# Patient Record
Sex: Female | Born: 1940
Health system: Southern US, Community
[De-identification: ages and names within clinical notes are randomized; demographics above are authoritative.]

## PROBLEM LIST (undated history)

## (undated) DIAGNOSIS — R413 Other amnesia: Secondary | ICD-10-CM

## (undated) DIAGNOSIS — Z818 Family history of other mental and behavioral disorders: Secondary | ICD-10-CM

## (undated) DIAGNOSIS — N390 Urinary tract infection, site not specified: Secondary | ICD-10-CM

## (undated) DIAGNOSIS — A879 Viral meningitis, unspecified: Secondary | ICD-10-CM

## (undated) DIAGNOSIS — D239 Other benign neoplasm of skin, unspecified: Secondary | ICD-10-CM

## (undated) DIAGNOSIS — M19041 Primary osteoarthritis, right hand: Secondary | ICD-10-CM

## (undated) DIAGNOSIS — M5136 Other intervertebral disc degeneration, lumbar region: Principal | ICD-10-CM

## (undated) DIAGNOSIS — G4733 Obstructive sleep apnea (adult) (pediatric): Secondary | ICD-10-CM

## (undated) DIAGNOSIS — Z833 Family history of diabetes mellitus: Secondary | ICD-10-CM

## (undated) DIAGNOSIS — M159 Polyosteoarthritis, unspecified: Secondary | ICD-10-CM

## (undated) DIAGNOSIS — K449 Diaphragmatic hernia without obstruction or gangrene: Secondary | ICD-10-CM

## (undated) DIAGNOSIS — F429 Obsessive-compulsive disorder, unspecified: Secondary | ICD-10-CM

## (undated) DIAGNOSIS — K5730 Diverticulosis of large intestine without perforation or abscess without bleeding: Secondary | ICD-10-CM

## (undated) DIAGNOSIS — M810 Age-related osteoporosis without current pathological fracture: Secondary | ICD-10-CM

## (undated) DIAGNOSIS — D649 Anemia, unspecified: Secondary | ICD-10-CM

## (undated) DIAGNOSIS — R4182 Altered mental status, unspecified: Secondary | ICD-10-CM

## (undated) DIAGNOSIS — S060X0A Concussion without loss of consciousness, initial encounter: Secondary | ICD-10-CM

## (undated) DIAGNOSIS — R7989 Other specified abnormal findings of blood chemistry: Secondary | ICD-10-CM

## (undated) DIAGNOSIS — D2320 Other benign neoplasm of skin of unspecified ear and external auricular canal: Secondary | ICD-10-CM

## (undated) DIAGNOSIS — R197 Diarrhea, unspecified: Secondary | ICD-10-CM

## (undated) DIAGNOSIS — G4700 Insomnia, unspecified: Secondary | ICD-10-CM

## (undated) DIAGNOSIS — F419 Anxiety disorder, unspecified: Secondary | ICD-10-CM

## (undated) DIAGNOSIS — J301 Allergic rhinitis due to pollen: Secondary | ICD-10-CM

## (undated) DIAGNOSIS — A6000 Herpesviral infection of urogenital system, unspecified: Secondary | ICD-10-CM

## (undated) DIAGNOSIS — K9089 Other intestinal malabsorption: Secondary | ICD-10-CM

## (undated) DIAGNOSIS — W19XXXA Unspecified fall, initial encounter: Secondary | ICD-10-CM

## (undated) DIAGNOSIS — M109 Gout, unspecified: Secondary | ICD-10-CM

## (undated) DIAGNOSIS — G4721 Circadian rhythm sleep disorder, delayed sleep phase type: Secondary | ICD-10-CM

## (undated) DIAGNOSIS — G2581 Restless legs syndrome: Secondary | ICD-10-CM

## (undated) DIAGNOSIS — Z0000 Encounter for general adult medical examination without abnormal findings: Secondary | ICD-10-CM

## (undated) DIAGNOSIS — B029 Zoster without complications: Secondary | ICD-10-CM

## (undated) DIAGNOSIS — E039 Hypothyroidism, unspecified: Secondary | ICD-10-CM

## (undated) DIAGNOSIS — M19042 Primary osteoarthritis, left hand: Secondary | ICD-10-CM

## (undated) DIAGNOSIS — M170 Bilateral primary osteoarthritis of knee: Secondary | ICD-10-CM

## (undated) DIAGNOSIS — R109 Unspecified abdominal pain: Secondary | ICD-10-CM

## (undated) DIAGNOSIS — E785 Hyperlipidemia, unspecified: Secondary | ICD-10-CM

## (undated) DIAGNOSIS — H6120 Impacted cerumen, unspecified ear: Secondary | ICD-10-CM

## (undated) DIAGNOSIS — R000 Tachycardia, unspecified: Secondary | ICD-10-CM

## (undated) DIAGNOSIS — R0602 Shortness of breath: Secondary | ICD-10-CM

## (undated) DIAGNOSIS — Z860100 Personal history of colon polyps, unspecified: Secondary | ICD-10-CM

## (undated) DIAGNOSIS — R600 Localized edema: Secondary | ICD-10-CM

## (undated) DIAGNOSIS — D229 Melanocytic nevi, unspecified: Secondary | ICD-10-CM

## (undated) DIAGNOSIS — M549 Dorsalgia, unspecified: Secondary | ICD-10-CM

## (undated) DIAGNOSIS — I10 Essential (primary) hypertension: Secondary | ICD-10-CM

## (undated) DIAGNOSIS — K589 Irritable bowel syndrome without diarrhea: Secondary | ICD-10-CM

## (undated) DIAGNOSIS — K2980 Duodenitis without bleeding: Secondary | ICD-10-CM

## (undated) DIAGNOSIS — R410 Disorientation, unspecified: Secondary | ICD-10-CM

## (undated) DIAGNOSIS — E119 Type 2 diabetes mellitus without complications: Secondary | ICD-10-CM

## (undated) DIAGNOSIS — K219 Gastro-esophageal reflux disease without esophagitis: Secondary | ICD-10-CM

## (undated) HISTORY — DX: Hyperlipidemia, unspecified: E78.5

## (undated) HISTORY — DX: Anemia, unspecified: D64.9

## (undated) HISTORY — DX: Hypothyroidism, unspecified: E03.9

## (undated) HISTORY — DX: Dorsalgia, unspecified: M54.9

## (undated) HISTORY — DX: Essential (primary) hypertension: I10

## (undated) HISTORY — DX: Type 2 diabetes mellitus without complications: E11.9

## (undated) HISTORY — DX: Localized edema: R60.0

## (undated) HISTORY — DX: Other specified abnormal findings of blood chemistry: R79.89

## (undated) HISTORY — DX: Other intervertebral disc degeneration, lumbar region: M51.36

## (undated) HISTORY — DX: Herpesviral infection of urogenital system, unspecified: A60.00

## (undated) HISTORY — DX: Polyosteoarthritis, unspecified: M15.9

## (undated) HISTORY — DX: Primary osteoarthritis, right hand: M19.041

## (undated) HISTORY — DX: Zoster without complications: B02.9

## (undated) HISTORY — DX: Primary osteoarthritis, left hand: M19.042

## (undated) HISTORY — DX: Gastro-esophageal reflux disease without esophagitis: K21.9

## (undated) HISTORY — DX: Diaphragmatic hernia without obstruction or gangrene: K44.9

## (undated) HISTORY — DX: Age-related osteoporosis without current pathological fracture: M81.0

## (undated) HISTORY — DX: Restless legs syndrome: G25.81

## (undated) HISTORY — DX: Viral meningitis, unspecified: A87.9

## (undated) HISTORY — DX: Unspecified abdominal pain: R10.9

## (undated) HISTORY — DX: Urinary tract infection, site not specified: N39.0

## (undated) HISTORY — DX: Tachycardia, unspecified: R00.0

## (undated) HISTORY — DX: Diarrhea, unspecified: R19.7

## (undated) HISTORY — DX: Impacted cerumen, unspecified ear: H61.20

## (undated) HISTORY — DX: Irritable bowel syndrome without diarrhea: K58.9

## (undated) HISTORY — DX: Shortness of breath: R06.02

## (undated) HISTORY — DX: Family history of other mental and behavioral disorders: Z81.8

## (undated) HISTORY — DX: Family history of diabetes mellitus: Z83.3

## (undated) HISTORY — DX: Personal history of colon polyps, unspecified: Z86.0100

## (undated) HISTORY — DX: Concussion without loss of consciousness, initial encounter: S06.0X0A

## (undated) HISTORY — DX: Melanocytic nevi, unspecified: D22.9

## (undated) HISTORY — DX: Other intestinal malabsorption: K90.89

## (undated) HISTORY — DX: Other benign neoplasm of skin of unspecified ear and external auricular canal: D23.20

## (undated) HISTORY — DX: Obsessive-compulsive disorder, unspecified: F42.9

## (undated) HISTORY — DX: Other benign neoplasm of skin, unspecified: D23.9

## (undated) HISTORY — DX: Personal history of colonic polyps: Z86.010

## (undated) HISTORY — DX: Bilateral primary osteoarthritis of knee: M17.0

## (undated) HISTORY — DX: Allergic rhinitis due to pollen: J30.1

## (undated) HISTORY — DX: Altered mental status, unspecified: R41.82

## (undated) HISTORY — DX: Gout, unspecified: M10.9

## (undated) HISTORY — DX: Insomnia, unspecified: G47.00

## (undated) HISTORY — DX: Duodenitis without bleeding: K29.80

## (undated) HISTORY — DX: Anxiety disorder, unspecified: F41.9

## (undated) HISTORY — DX: Other amnesia: R41.3

## (undated) HISTORY — DX: Circadian rhythm sleep disorder, delayed sleep phase type: G47.21

## (undated) HISTORY — DX: Diverticulosis of large intestine without perforation or abscess without bleeding: K57.30

## (undated) HISTORY — DX: Unspecified fall, initial encounter: W19.XXXA

## (undated) HISTORY — DX: Disorientation, unspecified: R41.0

## (undated) HISTORY — DX: Obstructive sleep apnea (adult) (pediatric): G47.33

## (undated) HISTORY — PX: COLONOSCOPY: SHX174

## (undated) HISTORY — DX: Encounter for general adult medical examination without abnormal findings: Z00.00

---

## 1997-07-23 ENCOUNTER — Ambulatory Visit (HOSPITAL_COMMUNITY): Admission: RE | Admit: 1997-07-23 | Discharge: 1997-07-23 | Payer: Self-pay | Admitting: Obstetrics and Gynecology

## 1998-02-09 HISTORY — PX: NASAL SINUS SURGERY: SHX719

## 1998-03-29 ENCOUNTER — Other Ambulatory Visit: Admission: RE | Admit: 1998-03-29 | Discharge: 1998-03-29 | Payer: Self-pay | Admitting: Obstetrics and Gynecology

## 1998-08-07 ENCOUNTER — Encounter: Payer: Self-pay | Admitting: Obstetrics and Gynecology

## 1998-08-09 ENCOUNTER — Encounter (INDEPENDENT_AMBULATORY_CARE_PROVIDER_SITE_OTHER): Payer: Self-pay | Admitting: Specialist

## 1998-08-09 ENCOUNTER — Ambulatory Visit (HOSPITAL_COMMUNITY): Admission: RE | Admit: 1998-08-09 | Discharge: 1998-08-09 | Payer: Self-pay | Admitting: Obstetrics and Gynecology

## 1999-04-07 ENCOUNTER — Other Ambulatory Visit: Admission: RE | Admit: 1999-04-07 | Discharge: 1999-04-07 | Payer: Self-pay | Admitting: Obstetrics and Gynecology

## 2000-05-10 ENCOUNTER — Other Ambulatory Visit: Admission: RE | Admit: 2000-05-10 | Discharge: 2000-05-10 | Payer: Self-pay | Admitting: Obstetrics and Gynecology

## 2001-06-20 ENCOUNTER — Other Ambulatory Visit: Admission: RE | Admit: 2001-06-20 | Discharge: 2001-06-20 | Payer: Self-pay | Admitting: Obstetrics and Gynecology

## 2002-07-24 ENCOUNTER — Other Ambulatory Visit: Admission: RE | Admit: 2002-07-24 | Discharge: 2002-07-24 | Payer: Self-pay | Admitting: Obstetrics and Gynecology

## 2002-08-26 ENCOUNTER — Emergency Department (HOSPITAL_COMMUNITY): Admission: EM | Admit: 2002-08-26 | Discharge: 2002-08-26 | Payer: Self-pay | Admitting: Emergency Medicine

## 2003-02-10 HISTORY — PX: CARPAL TUNNEL RELEASE: SHX101

## 2003-05-27 ENCOUNTER — Emergency Department (HOSPITAL_COMMUNITY): Admission: EM | Admit: 2003-05-27 | Discharge: 2003-05-27 | Payer: Self-pay | Admitting: Emergency Medicine

## 2003-09-06 ENCOUNTER — Other Ambulatory Visit: Admission: RE | Admit: 2003-09-06 | Discharge: 2003-09-06 | Payer: Self-pay | Admitting: Obstetrics and Gynecology

## 2004-06-10 ENCOUNTER — Ambulatory Visit: Payer: Self-pay | Admitting: Family Medicine

## 2004-06-16 ENCOUNTER — Emergency Department (HOSPITAL_COMMUNITY): Admission: EM | Admit: 2004-06-16 | Discharge: 2004-06-16 | Payer: Self-pay | Admitting: Emergency Medicine

## 2004-06-25 ENCOUNTER — Ambulatory Visit: Payer: Self-pay | Admitting: Family Medicine

## 2004-06-26 ENCOUNTER — Ambulatory Visit: Payer: Self-pay | Admitting: Gastroenterology

## 2004-06-30 ENCOUNTER — Ambulatory Visit: Payer: Self-pay | Admitting: Gastroenterology

## 2004-06-30 ENCOUNTER — Encounter (INDEPENDENT_AMBULATORY_CARE_PROVIDER_SITE_OTHER): Payer: Self-pay | Admitting: Specialist

## 2004-07-01 ENCOUNTER — Encounter: Payer: Self-pay | Admitting: Gastroenterology

## 2004-07-15 ENCOUNTER — Ambulatory Visit: Payer: Self-pay | Admitting: Gastroenterology

## 2004-07-29 ENCOUNTER — Ambulatory Visit: Payer: Self-pay | Admitting: Gastroenterology

## 2004-08-21 ENCOUNTER — Ambulatory Visit: Payer: Self-pay | Admitting: Family Medicine

## 2004-10-14 ENCOUNTER — Ambulatory Visit: Payer: Self-pay | Admitting: Gastroenterology

## 2004-11-27 ENCOUNTER — Ambulatory Visit: Payer: Self-pay | Admitting: Gastroenterology

## 2004-12-10 ENCOUNTER — Encounter (INDEPENDENT_AMBULATORY_CARE_PROVIDER_SITE_OTHER): Payer: Self-pay | Admitting: Specialist

## 2004-12-10 ENCOUNTER — Ambulatory Visit: Payer: Self-pay | Admitting: Gastroenterology

## 2005-01-09 ENCOUNTER — Ambulatory Visit: Payer: Self-pay | Admitting: Gastroenterology

## 2005-02-27 ENCOUNTER — Ambulatory Visit: Payer: Self-pay | Admitting: Gastroenterology

## 2005-03-31 ENCOUNTER — Ambulatory Visit: Payer: Self-pay | Admitting: Gastroenterology

## 2005-04-16 ENCOUNTER — Ambulatory Visit: Payer: Self-pay | Admitting: Gastroenterology

## 2005-04-21 ENCOUNTER — Ambulatory Visit: Payer: Self-pay | Admitting: Family Medicine

## 2005-04-22 ENCOUNTER — Ambulatory Visit: Payer: Self-pay | Admitting: Gastroenterology

## 2005-04-29 ENCOUNTER — Ambulatory Visit: Payer: Self-pay | Admitting: Gastroenterology

## 2005-05-07 ENCOUNTER — Ambulatory Visit: Payer: Self-pay | Admitting: Gastroenterology

## 2005-06-02 ENCOUNTER — Ambulatory Visit: Payer: Self-pay | Admitting: Family Medicine

## 2005-07-14 ENCOUNTER — Ambulatory Visit: Payer: Self-pay | Admitting: Family Medicine

## 2005-07-30 ENCOUNTER — Ambulatory Visit: Payer: Self-pay | Admitting: Gastroenterology

## 2005-08-06 ENCOUNTER — Ambulatory Visit: Payer: Self-pay | Admitting: Gastroenterology

## 2005-08-27 ENCOUNTER — Ambulatory Visit: Payer: Self-pay | Admitting: Family Medicine

## 2005-09-15 ENCOUNTER — Ambulatory Visit: Payer: Self-pay | Admitting: Gastroenterology

## 2005-11-12 ENCOUNTER — Encounter: Admission: RE | Admit: 2005-11-12 | Discharge: 2005-11-12 | Payer: Self-pay | Admitting: Orthopedic Surgery

## 2005-12-21 ENCOUNTER — Ambulatory Visit: Payer: Self-pay | Admitting: Family Medicine

## 2005-12-23 ENCOUNTER — Inpatient Hospital Stay (HOSPITAL_COMMUNITY): Admission: RE | Admit: 2005-12-23 | Discharge: 2005-12-25 | Payer: Self-pay | Admitting: Orthopedic Surgery

## 2005-12-24 HISTORY — PX: LAMINECTOMY: SHX219

## 2006-03-08 ENCOUNTER — Ambulatory Visit: Payer: Self-pay | Admitting: Family Medicine

## 2006-03-11 ENCOUNTER — Ambulatory Visit: Payer: Self-pay | Admitting: Gastroenterology

## 2006-03-11 LAB — CONVERTED CEMR LAB
BUN: 17 mg/dL (ref 6–23)
Basophils Absolute: 0.1 10*3/uL (ref 0.0–0.1)
Basophils Relative: 0.8 % (ref 0.0–1.0)
CO2: 26 meq/L (ref 19–32)
GFR calc Af Amer: 80 mL/min
Hemoglobin: 14.9 g/dL (ref 12.0–15.0)
Lymphocytes Relative: 20.5 % (ref 12.0–46.0)
MCHC: 34.7 g/dL (ref 30.0–36.0)
Monocytes Absolute: 1.2 10*3/uL — ABNORMAL HIGH (ref 0.2–0.7)
Monocytes Relative: 9 % (ref 3.0–11.0)
Neutro Abs: 9.3 10*3/uL — ABNORMAL HIGH (ref 1.4–7.7)
Neutrophils Relative %: 68.8 % (ref 43.0–77.0)
Potassium: 3.8 meq/L (ref 3.5–5.1)
Sed Rate: 10 mm/hr (ref 0–25)
Sodium: 138 meq/L (ref 135–145)

## 2006-03-15 ENCOUNTER — Encounter: Payer: Self-pay | Admitting: Gastroenterology

## 2006-03-31 ENCOUNTER — Ambulatory Visit: Payer: Self-pay | Admitting: Family Medicine

## 2006-03-31 LAB — CONVERTED CEMR LAB
Albumin: 3.6 g/dL (ref 3.5–5.2)
Basophils Absolute: 0.1 10*3/uL (ref 0.0–0.1)
Bilirubin, Direct: 0.1 mg/dL (ref 0.0–0.3)
Eosinophils Absolute: 0.1 10*3/uL (ref 0.0–0.6)
Eosinophils Relative: 0.9 % (ref 0.0–5.0)
GFR calc Af Amer: 80 mL/min
GFR calc non Af Amer: 66 mL/min
Glucose, Bld: 158 mg/dL — ABNORMAL HIGH (ref 70–99)
HCT: 43.4 % (ref 36.0–46.0)
Hemoglobin: 15.1 g/dL — ABNORMAL HIGH (ref 12.0–15.0)
Hgb A1c MFr Bld: 7.3 % — ABNORMAL HIGH (ref 4.6–6.0)
Lymphocytes Relative: 31.3 % (ref 12.0–46.0)
MCHC: 34.8 g/dL (ref 30.0–36.0)
MCV: 96.9 fL (ref 78.0–100.0)
Monocytes Absolute: 0.9 10*3/uL — ABNORMAL HIGH (ref 0.2–0.7)
Neutro Abs: 6.1 10*3/uL (ref 1.4–7.7)
Neutrophils Relative %: 58.1 % (ref 43.0–77.0)
Potassium: 3.8 meq/L (ref 3.5–5.1)
RBC: 4.48 M/uL (ref 3.87–5.11)
Sodium: 139 meq/L (ref 135–145)
T3, Free: 2.7 pg/mL (ref 2.3–4.2)
TSH: 6.92 microintl units/mL — ABNORMAL HIGH (ref 0.35–5.50)
Vitamin B-12: 391 pg/mL (ref 211–911)

## 2006-04-05 ENCOUNTER — Ambulatory Visit: Payer: Self-pay | Admitting: Family Medicine

## 2006-04-11 ENCOUNTER — Encounter: Admission: RE | Admit: 2006-04-11 | Discharge: 2006-04-11 | Payer: Self-pay | Admitting: Family Medicine

## 2006-04-15 ENCOUNTER — Ambulatory Visit: Payer: Self-pay

## 2006-04-16 ENCOUNTER — Ambulatory Visit: Payer: Self-pay | Admitting: Gastroenterology

## 2006-04-26 ENCOUNTER — Ambulatory Visit: Payer: Self-pay | Admitting: Gastroenterology

## 2006-04-26 ENCOUNTER — Encounter (INDEPENDENT_AMBULATORY_CARE_PROVIDER_SITE_OTHER): Payer: Self-pay | Admitting: Specialist

## 2006-06-10 ENCOUNTER — Ambulatory Visit: Payer: Self-pay | Admitting: Gastroenterology

## 2006-07-07 ENCOUNTER — Encounter: Payer: Self-pay | Admitting: Family Medicine

## 2006-07-14 ENCOUNTER — Ambulatory Visit: Payer: Self-pay | Admitting: Family Medicine

## 2006-07-14 DIAGNOSIS — Z8659 Personal history of other mental and behavioral disorders: Secondary | ICD-10-CM | POA: Insufficient documentation

## 2006-07-14 DIAGNOSIS — I10 Essential (primary) hypertension: Secondary | ICD-10-CM | POA: Insufficient documentation

## 2006-07-14 DIAGNOSIS — M109 Gout, unspecified: Secondary | ICD-10-CM | POA: Insufficient documentation

## 2006-07-14 DIAGNOSIS — R609 Edema, unspecified: Secondary | ICD-10-CM | POA: Insufficient documentation

## 2006-07-14 DIAGNOSIS — E039 Hypothyroidism, unspecified: Secondary | ICD-10-CM | POA: Insufficient documentation

## 2006-07-14 DIAGNOSIS — R0602 Shortness of breath: Secondary | ICD-10-CM

## 2006-07-21 ENCOUNTER — Telehealth (INDEPENDENT_AMBULATORY_CARE_PROVIDER_SITE_OTHER): Payer: Self-pay | Admitting: *Deleted

## 2006-07-28 ENCOUNTER — Encounter: Payer: Self-pay | Admitting: Family Medicine

## 2006-07-30 ENCOUNTER — Ambulatory Visit: Payer: Self-pay | Admitting: Family Medicine

## 2006-07-30 DIAGNOSIS — R7989 Other specified abnormal findings of blood chemistry: Secondary | ICD-10-CM | POA: Insufficient documentation

## 2006-08-02 ENCOUNTER — Ambulatory Visit: Payer: Self-pay | Admitting: Family Medicine

## 2006-08-04 ENCOUNTER — Ambulatory Visit: Payer: Self-pay

## 2006-08-04 ENCOUNTER — Encounter: Payer: Self-pay | Admitting: Family Medicine

## 2006-08-05 ENCOUNTER — Telehealth (INDEPENDENT_AMBULATORY_CARE_PROVIDER_SITE_OTHER): Payer: Self-pay | Admitting: *Deleted

## 2006-08-05 ENCOUNTER — Encounter: Payer: Self-pay | Admitting: Family Medicine

## 2006-08-05 LAB — CONVERTED CEMR LAB
Chloride: 109 meq/L (ref 96–112)
GFR calc non Af Amer: 66 mL/min
Glucose, Bld: 119 mg/dL — ABNORMAL HIGH (ref 70–99)
Hgb A1c MFr Bld: 6.5 % — ABNORMAL HIGH (ref 4.6–6.0)
Potassium: 3.9 meq/L (ref 3.5–5.1)
Sodium: 141 meq/L (ref 135–145)

## 2006-08-06 ENCOUNTER — Encounter (INDEPENDENT_AMBULATORY_CARE_PROVIDER_SITE_OTHER): Payer: Self-pay | Admitting: *Deleted

## 2006-08-10 ENCOUNTER — Telehealth (INDEPENDENT_AMBULATORY_CARE_PROVIDER_SITE_OTHER): Payer: Self-pay | Admitting: *Deleted

## 2006-09-06 ENCOUNTER — Emergency Department (HOSPITAL_COMMUNITY): Admission: EM | Admit: 2006-09-06 | Discharge: 2006-09-06 | Payer: Self-pay | Admitting: Emergency Medicine

## 2006-09-07 ENCOUNTER — Telehealth (INDEPENDENT_AMBULATORY_CARE_PROVIDER_SITE_OTHER): Payer: Self-pay | Admitting: *Deleted

## 2006-09-07 ENCOUNTER — Ambulatory Visit: Payer: Self-pay | Admitting: Family Medicine

## 2006-09-08 LAB — CONVERTED CEMR LAB
Alkaline Phosphatase: 80 units/L (ref 39–117)
Bilirubin, Direct: 0.1 mg/dL (ref 0.0–0.3)
CO2: 29 meq/L (ref 19–32)
Creatinine, Ser: 1 mg/dL (ref 0.4–1.2)
GFR calc Af Amer: 71 mL/min
Glucose, Bld: 180 mg/dL — ABNORMAL HIGH (ref 70–99)
Potassium: 4.3 meq/L (ref 3.5–5.1)
Sodium: 137 meq/L (ref 135–145)
Total Bilirubin: 0.8 mg/dL (ref 0.3–1.2)
Total Protein: 6.7 g/dL (ref 6.0–8.3)

## 2006-09-20 ENCOUNTER — Telehealth (INDEPENDENT_AMBULATORY_CARE_PROVIDER_SITE_OTHER): Payer: Self-pay | Admitting: *Deleted

## 2006-10-18 ENCOUNTER — Telehealth: Payer: Self-pay | Admitting: Family Medicine

## 2006-10-19 ENCOUNTER — Ambulatory Visit: Payer: Self-pay | Admitting: Family Medicine

## 2006-10-27 LAB — CONVERTED CEMR LAB
ALT: 24 units/L (ref 0–35)
AST: 26 units/L (ref 0–37)
Albumin: 3.5 g/dL (ref 3.5–5.2)
Basophils Absolute: 0 10*3/uL (ref 0.0–0.1)
Calcium: 9 mg/dL (ref 8.4–10.5)
Chloride: 106 meq/L (ref 96–112)
Creatinine, Ser: 0.9 mg/dL (ref 0.4–1.2)
Eosinophils Absolute: 0.1 10*3/uL (ref 0.0–0.6)
Eosinophils Relative: 1.7 % (ref 0.0–5.0)
GFR calc non Af Amer: 66 mL/min
HCT: 41 % (ref 36.0–46.0)
MCV: 96.7 fL (ref 78.0–100.0)
Neutrophils Relative %: 57.2 % (ref 43.0–77.0)
Platelets: 332 10*3/uL (ref 150–400)
RBC: 4.24 M/uL (ref 3.87–5.11)
RDW: 12.8 % (ref 11.5–14.6)
Total Bilirubin: 0.5 mg/dL (ref 0.3–1.2)
WBC: 7.2 10*3/uL (ref 4.5–10.5)

## 2006-11-04 ENCOUNTER — Ambulatory Visit: Payer: Self-pay | Admitting: Family Medicine

## 2006-11-10 ENCOUNTER — Encounter: Admission: RE | Admit: 2006-11-10 | Discharge: 2006-11-10 | Payer: Self-pay | Admitting: Family Medicine

## 2006-11-23 ENCOUNTER — Ambulatory Visit: Payer: Self-pay | Admitting: Family Medicine

## 2006-11-23 DIAGNOSIS — H6120 Impacted cerumen, unspecified ear: Secondary | ICD-10-CM

## 2006-12-28 ENCOUNTER — Ambulatory Visit: Payer: Self-pay | Admitting: Family Medicine

## 2006-12-29 LAB — CONVERTED CEMR LAB
CO2: 25 meq/L (ref 19–32)
Chloride: 104 meq/L (ref 96–112)
Creatinine, Ser: 1 mg/dL (ref 0.4–1.2)
Glucose, Bld: 143 mg/dL — ABNORMAL HIGH (ref 70–99)
Potassium: 3.7 meq/L (ref 3.5–5.1)
Sodium: 141 meq/L (ref 135–145)

## 2007-01-25 ENCOUNTER — Ambulatory Visit: Payer: Self-pay | Admitting: Internal Medicine

## 2007-01-25 DIAGNOSIS — B029 Zoster without complications: Secondary | ICD-10-CM | POA: Insufficient documentation

## 2007-02-25 ENCOUNTER — Telehealth (INDEPENDENT_AMBULATORY_CARE_PROVIDER_SITE_OTHER): Payer: Self-pay | Admitting: *Deleted

## 2007-02-25 ENCOUNTER — Ambulatory Visit: Payer: Self-pay | Admitting: Family Medicine

## 2007-02-25 LAB — CONVERTED CEMR LAB
Bilirubin Urine: NEGATIVE
Blood in Urine, dipstick: NEGATIVE
Ketones, urine, test strip: NEGATIVE
Protein, U semiquant: NEGATIVE
Urobilinogen, UA: NEGATIVE

## 2007-03-11 ENCOUNTER — Telehealth (INDEPENDENT_AMBULATORY_CARE_PROVIDER_SITE_OTHER): Payer: Self-pay | Admitting: *Deleted

## 2007-03-14 DIAGNOSIS — M810 Age-related osteoporosis without current pathological fracture: Secondary | ICD-10-CM | POA: Insufficient documentation

## 2007-03-18 ENCOUNTER — Encounter: Payer: Self-pay | Admitting: Family Medicine

## 2007-03-21 ENCOUNTER — Ambulatory Visit: Payer: Self-pay | Admitting: Family Medicine

## 2007-03-21 DIAGNOSIS — D2320 Other benign neoplasm of skin of unspecified ear and external auricular canal: Secondary | ICD-10-CM

## 2007-03-21 DIAGNOSIS — E785 Hyperlipidemia, unspecified: Secondary | ICD-10-CM

## 2007-03-21 DIAGNOSIS — D239 Other benign neoplasm of skin, unspecified: Secondary | ICD-10-CM | POA: Insufficient documentation

## 2007-03-21 LAB — CONVERTED CEMR LAB
Bilirubin Urine: NEGATIVE
Blood in Urine, dipstick: NEGATIVE
Glucose, Urine, Semiquant: NEGATIVE
Ketones, urine, test strip: NEGATIVE
Protein, U semiquant: NEGATIVE
Specific Gravity, Urine: >=1.03

## 2007-03-23 ENCOUNTER — Encounter (INDEPENDENT_AMBULATORY_CARE_PROVIDER_SITE_OTHER): Payer: Self-pay | Admitting: *Deleted

## 2007-03-26 ENCOUNTER — Encounter: Payer: Self-pay | Admitting: Family Medicine

## 2007-03-28 ENCOUNTER — Telehealth (INDEPENDENT_AMBULATORY_CARE_PROVIDER_SITE_OTHER): Payer: Self-pay | Admitting: *Deleted

## 2007-04-13 ENCOUNTER — Encounter: Payer: Self-pay | Admitting: Family Medicine

## 2007-04-15 ENCOUNTER — Encounter: Payer: Self-pay | Admitting: Family Medicine

## 2007-06-14 ENCOUNTER — Ambulatory Visit: Payer: Self-pay | Admitting: Internal Medicine

## 2007-07-12 ENCOUNTER — Telehealth (INDEPENDENT_AMBULATORY_CARE_PROVIDER_SITE_OTHER): Payer: Self-pay | Admitting: *Deleted

## 2007-07-14 ENCOUNTER — Ambulatory Visit: Payer: Self-pay | Admitting: Internal Medicine

## 2007-07-15 ENCOUNTER — Telehealth: Payer: Self-pay | Admitting: Internal Medicine

## 2007-07-21 ENCOUNTER — Telehealth: Payer: Self-pay | Admitting: Internal Medicine

## 2007-08-01 ENCOUNTER — Telehealth (INDEPENDENT_AMBULATORY_CARE_PROVIDER_SITE_OTHER): Payer: Self-pay | Admitting: *Deleted

## 2007-08-02 ENCOUNTER — Ambulatory Visit: Payer: Self-pay | Admitting: Family Medicine

## 2007-08-04 ENCOUNTER — Telehealth (INDEPENDENT_AMBULATORY_CARE_PROVIDER_SITE_OTHER): Payer: Self-pay | Admitting: *Deleted

## 2007-08-04 LAB — CONVERTED CEMR LAB
BUN: 11 mg/dL (ref 6–23)
CO2: 27 meq/L (ref 19–32)
Chloride: 103 meq/L (ref 96–112)
GFR calc non Af Amer: 66 mL/min
Potassium: 4.3 meq/L (ref 3.5–5.1)

## 2007-08-10 ENCOUNTER — Emergency Department (HOSPITAL_COMMUNITY): Admission: EM | Admit: 2007-08-10 | Discharge: 2007-08-10 | Payer: Self-pay | Admitting: Emergency Medicine

## 2007-08-10 ENCOUNTER — Telehealth (INDEPENDENT_AMBULATORY_CARE_PROVIDER_SITE_OTHER): Payer: Self-pay | Admitting: *Deleted

## 2007-08-11 ENCOUNTER — Ambulatory Visit: Payer: Self-pay | Admitting: Family Medicine

## 2007-08-14 ENCOUNTER — Inpatient Hospital Stay (HOSPITAL_COMMUNITY): Admission: EM | Admit: 2007-08-14 | Discharge: 2007-08-15 | Payer: Self-pay | Admitting: Emergency Medicine

## 2007-08-14 ENCOUNTER — Ambulatory Visit: Payer: Self-pay | Admitting: Internal Medicine

## 2007-08-14 LAB — CONVERTED CEMR LAB
Alkaline Phosphatase: 74 units/L (ref 39–117)
Bilirubin, Direct: 0.1 mg/dL (ref 0.0–0.3)
Total Protein: 6.3 g/dL (ref 6.0–8.3)

## 2007-08-15 ENCOUNTER — Encounter (INDEPENDENT_AMBULATORY_CARE_PROVIDER_SITE_OTHER): Payer: Self-pay | Admitting: *Deleted

## 2007-08-17 ENCOUNTER — Telehealth (INDEPENDENT_AMBULATORY_CARE_PROVIDER_SITE_OTHER): Payer: Self-pay | Admitting: *Deleted

## 2007-08-18 ENCOUNTER — Ambulatory Visit: Payer: Self-pay | Admitting: Family Medicine

## 2007-08-19 ENCOUNTER — Telehealth (INDEPENDENT_AMBULATORY_CARE_PROVIDER_SITE_OTHER): Payer: Self-pay | Admitting: *Deleted

## 2007-08-22 ENCOUNTER — Telehealth (INDEPENDENT_AMBULATORY_CARE_PROVIDER_SITE_OTHER): Payer: Self-pay | Admitting: *Deleted

## 2007-08-22 LAB — CONVERTED CEMR LAB
BUN: 11 mg/dL (ref 6–23)
Chloride: 99 meq/L (ref 96–112)
Glucose, Bld: 155 mg/dL — ABNORMAL HIGH (ref 70–99)
Potassium: 3.5 meq/L (ref 3.5–5.1)

## 2007-08-23 ENCOUNTER — Ambulatory Visit: Payer: Self-pay | Admitting: Family Medicine

## 2007-08-26 ENCOUNTER — Ambulatory Visit: Payer: Self-pay | Admitting: Family Medicine

## 2007-08-26 DIAGNOSIS — E119 Type 2 diabetes mellitus without complications: Secondary | ICD-10-CM | POA: Insufficient documentation

## 2007-08-26 LAB — CONVERTED CEMR LAB
BUN: 15 mg/dL (ref 6–23)
Chloride: 102 meq/L (ref 96–112)
Glucose, Bld: 157 mg/dL — ABNORMAL HIGH (ref 70–99)
Potassium: 3.5 meq/L (ref 3.5–5.1)

## 2007-08-27 ENCOUNTER — Encounter: Payer: Self-pay | Admitting: Family Medicine

## 2007-08-29 ENCOUNTER — Telehealth (INDEPENDENT_AMBULATORY_CARE_PROVIDER_SITE_OTHER): Payer: Self-pay | Admitting: *Deleted

## 2007-09-05 ENCOUNTER — Encounter: Admission: RE | Admit: 2007-09-05 | Discharge: 2007-11-01 | Payer: Self-pay | Admitting: Family Medicine

## 2007-09-05 ENCOUNTER — Encounter (INDEPENDENT_AMBULATORY_CARE_PROVIDER_SITE_OTHER): Payer: Self-pay | Admitting: *Deleted

## 2007-09-05 ENCOUNTER — Encounter: Payer: Self-pay | Admitting: Family Medicine

## 2007-09-05 LAB — CONVERTED CEMR LAB
Catecholamines Tot(E+NE) 24 Hr U: 0.028 mg/24hr
Dopamine 24 Hr Urine: 100 mcg/24hr (ref <500)
Norepinephrine 24 Hr Urine: 27 mcg/24hr (ref <80)
Protein, Ur: 20 mg/24hr — ABNORMAL LOW (ref 50–100)

## 2007-09-06 ENCOUNTER — Encounter: Payer: Self-pay | Admitting: Family Medicine

## 2007-09-08 ENCOUNTER — Telehealth (INDEPENDENT_AMBULATORY_CARE_PROVIDER_SITE_OTHER): Payer: Self-pay | Admitting: *Deleted

## 2007-09-08 ENCOUNTER — Ambulatory Visit: Payer: Self-pay | Admitting: Family Medicine

## 2007-09-08 LAB — CONVERTED CEMR LAB
Nitrite: NEGATIVE
pH: 6

## 2007-09-09 ENCOUNTER — Encounter: Payer: Self-pay | Admitting: Family Medicine

## 2007-09-11 ENCOUNTER — Encounter (INDEPENDENT_AMBULATORY_CARE_PROVIDER_SITE_OTHER): Payer: Self-pay | Admitting: *Deleted

## 2007-09-15 ENCOUNTER — Encounter: Payer: Self-pay | Admitting: Family Medicine

## 2007-09-23 ENCOUNTER — Ambulatory Visit: Payer: Self-pay | Admitting: Family Medicine

## 2007-09-26 ENCOUNTER — Encounter: Payer: Self-pay | Admitting: Family Medicine

## 2007-09-29 ENCOUNTER — Telehealth (INDEPENDENT_AMBULATORY_CARE_PROVIDER_SITE_OTHER): Payer: Self-pay | Admitting: *Deleted

## 2007-10-07 ENCOUNTER — Ambulatory Visit: Payer: Self-pay | Admitting: Family Medicine

## 2007-10-12 ENCOUNTER — Telehealth (INDEPENDENT_AMBULATORY_CARE_PROVIDER_SITE_OTHER): Payer: Self-pay | Admitting: *Deleted

## 2007-10-18 ENCOUNTER — Telehealth (INDEPENDENT_AMBULATORY_CARE_PROVIDER_SITE_OTHER): Payer: Self-pay | Admitting: *Deleted

## 2007-10-21 ENCOUNTER — Ambulatory Visit: Payer: Self-pay | Admitting: Family Medicine

## 2007-10-24 ENCOUNTER — Encounter (INDEPENDENT_AMBULATORY_CARE_PROVIDER_SITE_OTHER): Payer: Self-pay | Admitting: *Deleted

## 2007-11-13 ENCOUNTER — Telehealth (INDEPENDENT_AMBULATORY_CARE_PROVIDER_SITE_OTHER): Payer: Self-pay | Admitting: *Deleted

## 2007-11-13 ENCOUNTER — Ambulatory Visit: Payer: Self-pay | Admitting: Internal Medicine

## 2007-11-14 ENCOUNTER — Encounter (INDEPENDENT_AMBULATORY_CARE_PROVIDER_SITE_OTHER): Payer: Self-pay | Admitting: Neurology

## 2007-11-14 ENCOUNTER — Encounter: Payer: Self-pay | Admitting: Family Medicine

## 2007-11-14 ENCOUNTER — Ambulatory Visit: Payer: Self-pay | Admitting: Internal Medicine

## 2007-11-14 ENCOUNTER — Inpatient Hospital Stay (HOSPITAL_COMMUNITY): Admission: EM | Admit: 2007-11-14 | Discharge: 2007-11-23 | Payer: Self-pay | Admitting: Family Medicine

## 2007-11-15 ENCOUNTER — Encounter: Payer: Self-pay | Admitting: Family Medicine

## 2007-11-15 ENCOUNTER — Encounter (INDEPENDENT_AMBULATORY_CARE_PROVIDER_SITE_OTHER): Payer: Self-pay | Admitting: Neurology

## 2007-11-17 ENCOUNTER — Encounter: Payer: Self-pay | Admitting: Family Medicine

## 2007-11-19 ENCOUNTER — Encounter: Payer: Self-pay | Admitting: Family Medicine

## 2007-11-23 ENCOUNTER — Encounter: Payer: Self-pay | Admitting: Family Medicine

## 2007-12-19 ENCOUNTER — Telehealth (INDEPENDENT_AMBULATORY_CARE_PROVIDER_SITE_OTHER): Payer: Self-pay | Admitting: *Deleted

## 2007-12-22 ENCOUNTER — Ambulatory Visit: Payer: Self-pay | Admitting: Family Medicine

## 2007-12-22 DIAGNOSIS — F411 Generalized anxiety disorder: Secondary | ICD-10-CM | POA: Insufficient documentation

## 2007-12-22 DIAGNOSIS — R404 Transient alteration of awareness: Secondary | ICD-10-CM | POA: Insufficient documentation

## 2007-12-22 DIAGNOSIS — Z8669 Personal history of other diseases of the nervous system and sense organs: Secondary | ICD-10-CM

## 2007-12-26 ENCOUNTER — Telehealth (INDEPENDENT_AMBULATORY_CARE_PROVIDER_SITE_OTHER): Payer: Self-pay | Admitting: *Deleted

## 2008-02-28 ENCOUNTER — Encounter: Payer: Self-pay | Admitting: Family Medicine

## 2008-03-27 ENCOUNTER — Ambulatory Visit: Payer: Self-pay | Admitting: Family Medicine

## 2008-04-02 ENCOUNTER — Encounter: Payer: Self-pay | Admitting: Family Medicine

## 2008-04-05 ENCOUNTER — Telehealth: Payer: Self-pay | Admitting: Family Medicine

## 2008-04-09 ENCOUNTER — Telehealth: Payer: Self-pay | Admitting: Family Medicine

## 2008-04-26 ENCOUNTER — Encounter: Payer: Self-pay | Admitting: Family Medicine

## 2008-05-02 ENCOUNTER — Ambulatory Visit: Payer: Self-pay | Admitting: Family Medicine

## 2008-05-02 DIAGNOSIS — J301 Allergic rhinitis due to pollen: Secondary | ICD-10-CM | POA: Insufficient documentation

## 2008-05-18 ENCOUNTER — Telehealth: Payer: Self-pay | Admitting: Family Medicine

## 2008-05-23 ENCOUNTER — Encounter: Payer: Self-pay | Admitting: Family Medicine

## 2008-07-10 ENCOUNTER — Telehealth: Payer: Self-pay | Admitting: Gastroenterology

## 2008-07-18 DIAGNOSIS — Z860100 Personal history of colon polyps, unspecified: Secondary | ICD-10-CM | POA: Insufficient documentation

## 2008-07-18 DIAGNOSIS — K2980 Duodenitis without bleeding: Secondary | ICD-10-CM | POA: Insufficient documentation

## 2008-07-18 DIAGNOSIS — R197 Diarrhea, unspecified: Secondary | ICD-10-CM

## 2008-07-18 DIAGNOSIS — K449 Diaphragmatic hernia without obstruction or gangrene: Secondary | ICD-10-CM | POA: Insufficient documentation

## 2008-07-18 DIAGNOSIS — K5730 Diverticulosis of large intestine without perforation or abscess without bleeding: Secondary | ICD-10-CM | POA: Insufficient documentation

## 2008-07-19 ENCOUNTER — Ambulatory Visit: Payer: Self-pay | Admitting: Gastroenterology

## 2008-08-02 ENCOUNTER — Ambulatory Visit: Payer: Self-pay | Admitting: Family Medicine

## 2008-08-21 ENCOUNTER — Ambulatory Visit: Payer: Self-pay | Admitting: Internal Medicine

## 2008-08-31 ENCOUNTER — Encounter: Payer: Self-pay | Admitting: Family Medicine

## 2008-08-31 ENCOUNTER — Telehealth (INDEPENDENT_AMBULATORY_CARE_PROVIDER_SITE_OTHER): Payer: Self-pay | Admitting: *Deleted

## 2008-08-31 DIAGNOSIS — M159 Polyosteoarthritis, unspecified: Secondary | ICD-10-CM

## 2008-09-18 ENCOUNTER — Ambulatory Visit: Payer: Self-pay | Admitting: Family Medicine

## 2008-09-19 ENCOUNTER — Telehealth (INDEPENDENT_AMBULATORY_CARE_PROVIDER_SITE_OTHER): Payer: Self-pay | Admitting: *Deleted

## 2008-09-19 LAB — CONVERTED CEMR LAB
BUN: 15 mg/dL (ref 6–23)
Chloride: 100 meq/L (ref 96–112)
Glucose, Bld: 133 mg/dL — ABNORMAL HIGH (ref 70–99)
Potassium: 3.8 meq/L (ref 3.5–5.1)
Uric Acid, Serum: 8.2 mg/dL — ABNORMAL HIGH (ref 2.4–7.0)

## 2008-10-09 ENCOUNTER — Ambulatory Visit: Payer: Self-pay | Admitting: Family Medicine

## 2008-10-14 LAB — CONVERTED CEMR LAB
CO2: 28 meq/L (ref 19–32)
Chloride: 102 meq/L (ref 96–112)
Creatinine, Ser: 1 mg/dL (ref 0.4–1.2)

## 2008-10-16 ENCOUNTER — Telehealth (INDEPENDENT_AMBULATORY_CARE_PROVIDER_SITE_OTHER): Payer: Self-pay | Admitting: *Deleted

## 2008-10-17 ENCOUNTER — Telehealth (INDEPENDENT_AMBULATORY_CARE_PROVIDER_SITE_OTHER): Payer: Self-pay | Admitting: *Deleted

## 2008-10-22 ENCOUNTER — Ambulatory Visit: Payer: Self-pay | Admitting: Family Medicine

## 2008-10-22 ENCOUNTER — Telehealth (INDEPENDENT_AMBULATORY_CARE_PROVIDER_SITE_OTHER): Payer: Self-pay | Admitting: *Deleted

## 2008-10-23 ENCOUNTER — Encounter: Payer: Self-pay | Admitting: Family Medicine

## 2008-10-23 LAB — CONVERTED CEMR LAB: Uric Acid, Serum: 4.9 mg/dL (ref 2.4–7.0)

## 2008-11-12 ENCOUNTER — Encounter: Payer: Self-pay | Admitting: Family Medicine

## 2008-11-23 ENCOUNTER — Encounter: Payer: Self-pay | Admitting: Family Medicine

## 2008-11-23 ENCOUNTER — Telehealth (INDEPENDENT_AMBULATORY_CARE_PROVIDER_SITE_OTHER): Payer: Self-pay | Admitting: *Deleted

## 2008-11-26 ENCOUNTER — Telehealth (INDEPENDENT_AMBULATORY_CARE_PROVIDER_SITE_OTHER): Payer: Self-pay | Admitting: *Deleted

## 2008-11-27 ENCOUNTER — Ambulatory Visit: Payer: Self-pay | Admitting: Family Medicine

## 2008-12-17 ENCOUNTER — Telehealth: Payer: Self-pay | Admitting: Family Medicine

## 2008-12-24 ENCOUNTER — Telehealth (INDEPENDENT_AMBULATORY_CARE_PROVIDER_SITE_OTHER): Payer: Self-pay | Admitting: *Deleted

## 2008-12-31 ENCOUNTER — Ambulatory Visit: Payer: Self-pay | Admitting: Family Medicine

## 2008-12-31 ENCOUNTER — Other Ambulatory Visit: Admission: RE | Admit: 2008-12-31 | Discharge: 2008-12-31 | Payer: Self-pay | Admitting: Family Medicine

## 2008-12-31 LAB — CONVERTED CEMR LAB
Bilirubin Urine: NEGATIVE
Blood in Urine, dipstick: NEGATIVE
Ketones, urine, test strip: NEGATIVE
Nitrite: NEGATIVE
Specific Gravity, Urine: 1.02
pH: 5

## 2009-01-08 ENCOUNTER — Encounter (INDEPENDENT_AMBULATORY_CARE_PROVIDER_SITE_OTHER): Payer: Self-pay | Admitting: *Deleted

## 2009-01-09 ENCOUNTER — Telehealth (INDEPENDENT_AMBULATORY_CARE_PROVIDER_SITE_OTHER): Payer: Self-pay | Admitting: *Deleted

## 2009-01-09 LAB — CONVERTED CEMR LAB
AST: 37 units/L (ref 0–37)
Alkaline Phosphatase: 73 units/L (ref 39–117)
BUN: 15 mg/dL (ref 6–23)
Basophils Absolute: 0.1 10*3/uL (ref 0.0–0.1)
Calcium: 9.1 mg/dL (ref 8.4–10.5)
Cholesterol: 253 mg/dL — ABNORMAL HIGH (ref 0–200)
Creatinine, Ser: 0.9 mg/dL (ref 0.4–1.2)
Creatinine,U: 237.2 mg/dL
Direct LDL: 182.7 mg/dL
Eosinophils Absolute: 0.2 10*3/uL (ref 0.0–0.7)
GFR calc non Af Amer: 65.65 mL/min (ref >60)
Glucose, Bld: 121 mg/dL — ABNORMAL HIGH (ref 70–99)
HDL: 53.6 mg/dL (ref >39.00)
Lymphocytes Relative: 30.4 % (ref 12.0–46.0)
MCHC: 33.9 g/dL (ref 30.0–36.0)
Microalb Creat Ratio: 27.8 mg/g (ref 0.0–30.0)
Microalb, Ur: 6.6 mg/dL — ABNORMAL HIGH (ref 0.0–1.9)
Monocytes Relative: 8.6 % (ref 3.0–12.0)
Platelets: 357 10*3/uL (ref 150.0–400.0)
RDW: 12.7 % (ref 11.5–14.6)
Sodium: 138 meq/L (ref 135–145)
TSH: 20.58 microintl units/mL — ABNORMAL HIGH (ref 0.35–5.50)
Total Bilirubin: 0.8 mg/dL (ref 0.3–1.2)
Triglycerides: 270 mg/dL — ABNORMAL HIGH (ref 0.0–149.0)
Vit D, 25-Hydroxy: 16 ng/mL — ABNORMAL LOW (ref 30–89)

## 2009-01-18 ENCOUNTER — Encounter: Payer: Self-pay | Admitting: Family Medicine

## 2009-01-21 ENCOUNTER — Telehealth: Payer: Self-pay | Admitting: Family Medicine

## 2009-01-29 ENCOUNTER — Telehealth: Payer: Self-pay | Admitting: Family Medicine

## 2009-02-25 ENCOUNTER — Telehealth: Payer: Self-pay | Admitting: Family Medicine

## 2009-03-18 ENCOUNTER — Telehealth: Payer: Self-pay | Admitting: Family Medicine

## 2009-04-12 ENCOUNTER — Encounter: Payer: Self-pay | Admitting: Family Medicine

## 2009-05-10 ENCOUNTER — Encounter: Payer: Self-pay | Admitting: Family Medicine

## 2009-05-23 ENCOUNTER — Encounter: Payer: Self-pay | Admitting: Family Medicine

## 2009-06-28 ENCOUNTER — Telehealth (INDEPENDENT_AMBULATORY_CARE_PROVIDER_SITE_OTHER): Payer: Self-pay | Admitting: *Deleted

## 2009-07-02 ENCOUNTER — Ambulatory Visit: Payer: Self-pay | Admitting: Family Medicine

## 2009-07-04 ENCOUNTER — Ambulatory Visit: Payer: Self-pay | Admitting: Family Medicine

## 2009-07-09 ENCOUNTER — Encounter: Payer: Self-pay | Admitting: Family Medicine

## 2009-07-10 LAB — CONVERTED CEMR LAB
AST: 25 units/L (ref 0–37)
Albumin: 3.7 g/dL (ref 3.5–5.2)
Alkaline Phosphatase: 70 units/L (ref 39–117)
BUN: 22 mg/dL (ref 6–23)
Bilirubin, Direct: 0.1 mg/dL (ref 0.0–0.3)
CO2: 28 meq/L (ref 19–32)
Calcium: 9.2 mg/dL (ref 8.4–10.5)
Chloride: 103 meq/L (ref 96–112)
Creatinine, Ser: 0.8 mg/dL (ref 0.4–1.2)
LDL Cholesterol: 103 mg/dL — ABNORMAL HIGH (ref 0–99)
Total CHOL/HDL Ratio: 4
Triglycerides: 191 mg/dL — ABNORMAL HIGH (ref 0.0–149.0)

## 2009-07-11 ENCOUNTER — Telehealth (INDEPENDENT_AMBULATORY_CARE_PROVIDER_SITE_OTHER): Payer: Self-pay | Admitting: *Deleted

## 2009-07-12 ENCOUNTER — Emergency Department (HOSPITAL_COMMUNITY): Admission: EM | Admit: 2009-07-12 | Discharge: 2009-07-12 | Payer: Self-pay | Admitting: Family Medicine

## 2009-07-12 ENCOUNTER — Telehealth (INDEPENDENT_AMBULATORY_CARE_PROVIDER_SITE_OTHER): Payer: Self-pay | Admitting: *Deleted

## 2009-07-12 ENCOUNTER — Inpatient Hospital Stay (HOSPITAL_COMMUNITY): Admission: EM | Admit: 2009-07-12 | Discharge: 2009-07-14 | Payer: Self-pay | Admitting: Emergency Medicine

## 2009-07-15 ENCOUNTER — Encounter: Payer: Self-pay | Admitting: Family Medicine

## 2009-07-16 ENCOUNTER — Telehealth: Payer: Self-pay | Admitting: Family Medicine

## 2009-07-26 ENCOUNTER — Ambulatory Visit: Payer: Self-pay | Admitting: Family Medicine

## 2009-07-26 DIAGNOSIS — S060X0A Concussion without loss of consciousness, initial encounter: Secondary | ICD-10-CM

## 2009-07-26 DIAGNOSIS — N390 Urinary tract infection, site not specified: Secondary | ICD-10-CM | POA: Insufficient documentation

## 2009-07-26 LAB — CONVERTED CEMR LAB
Bilirubin Urine: NEGATIVE
Blood in Urine, dipstick: NEGATIVE
Ketones, urine, test strip: NEGATIVE
Nitrite: NEGATIVE
Protein, U semiquant: NEGATIVE
Urobilinogen, UA: 0.2

## 2009-07-29 LAB — CONVERTED CEMR LAB: TSH: 0.014 microintl units/mL — ABNORMAL LOW (ref 0.350–4.500)

## 2009-08-07 ENCOUNTER — Ambulatory Visit: Payer: Self-pay | Admitting: Pulmonary Disease

## 2009-08-07 DIAGNOSIS — G2581 Restless legs syndrome: Secondary | ICD-10-CM

## 2009-08-07 DIAGNOSIS — G4721 Circadian rhythm sleep disorder, delayed sleep phase type: Secondary | ICD-10-CM

## 2009-08-07 DIAGNOSIS — G4700 Insomnia, unspecified: Secondary | ICD-10-CM | POA: Insufficient documentation

## 2009-08-14 LAB — CONVERTED CEMR LAB
AST: 23 units/L (ref 0–37)
Albumin: 4 g/dL (ref 3.5–5.2)
Alkaline Phosphatase: 76 units/L (ref 39–117)
Basophils Relative: 1.2 % (ref 0.0–3.0)
CO2: 30 meq/L (ref 19–32)
Calcium: 9.7 mg/dL (ref 8.4–10.5)
Chloride: 105 meq/L (ref 96–112)
Eosinophils Absolute: 0.3 10*3/uL (ref 0.0–0.7)
Ferritin: 50.4 ng/mL (ref 10.0–291.0)
Folate: >20 ng/mL
Glucose, Bld: 95 mg/dL (ref 70–99)
HCT: 42.3 % (ref 36.0–46.0)
Hemoglobin: 14.7 g/dL (ref 12.0–15.0)
Lymphocytes Relative: 26.9 % (ref 12.0–46.0)
Lymphs Abs: 3 10*3/uL (ref 0.7–4.0)
MCHC: 34.6 g/dL (ref 30.0–36.0)
MCV: 95.8 fL (ref 78.0–100.0)
Neutro Abs: 6.8 10*3/uL (ref 1.4–7.7)
Potassium: 4.2 meq/L (ref 3.5–5.1)
RBC: 4.42 M/uL (ref 3.87–5.11)
RDW: 13.5 % (ref 11.5–14.6)
Sodium: 143 meq/L (ref 135–145)
TSH: 0.04 microintl units/mL — ABNORMAL LOW (ref 0.35–5.50)
Total Protein: 6.7 g/dL (ref 6.0–8.3)

## 2009-08-19 ENCOUNTER — Ambulatory Visit: Payer: Self-pay | Admitting: Family Medicine

## 2009-08-19 DIAGNOSIS — I498 Other specified cardiac arrhythmias: Secondary | ICD-10-CM

## 2009-08-28 ENCOUNTER — Telehealth (INDEPENDENT_AMBULATORY_CARE_PROVIDER_SITE_OTHER): Payer: Self-pay | Admitting: *Deleted

## 2009-08-28 LAB — CONVERTED CEMR LAB
Basophils Relative: 0.5 % (ref 0.0–3.0)
CO2: 25 meq/L (ref 19–32)
Chloride: 110 meq/L (ref 96–112)
Eosinophils Absolute: 0.2 10*3/uL (ref 0.0–0.7)
Glucose, Bld: 144 mg/dL — ABNORMAL HIGH (ref 70–99)
Hemoglobin: 14.2 g/dL (ref 12.0–15.0)
Lymphs Abs: 3.2 10*3/uL (ref 0.7–4.0)
MCHC: 33.1 g/dL (ref 30.0–36.0)
MCV: 97.7 fL (ref 78.0–100.0)
Monocytes Absolute: 1.3 10*3/uL — ABNORMAL HIGH (ref 0.1–1.0)
Neutro Abs: 4 10*3/uL (ref 1.4–7.7)
RBC: 4.39 M/uL (ref 3.87–5.11)
Sodium: 142 meq/L (ref 135–145)

## 2009-09-05 ENCOUNTER — Ambulatory Visit (HOSPITAL_BASED_OUTPATIENT_CLINIC_OR_DEPARTMENT_OTHER): Admission: RE | Admit: 2009-09-05 | Discharge: 2009-09-05 | Payer: Self-pay | Admitting: Pulmonary Disease

## 2009-09-05 ENCOUNTER — Encounter: Payer: Self-pay | Admitting: Pulmonary Disease

## 2009-09-09 ENCOUNTER — Telehealth: Payer: Self-pay | Admitting: Family Medicine

## 2009-09-09 ENCOUNTER — Ambulatory Visit: Payer: Self-pay | Admitting: Family Medicine

## 2009-09-11 ENCOUNTER — Ambulatory Visit: Payer: Self-pay | Admitting: Pulmonary Disease

## 2009-09-16 ENCOUNTER — Telehealth (INDEPENDENT_AMBULATORY_CARE_PROVIDER_SITE_OTHER): Payer: Self-pay | Admitting: *Deleted

## 2009-09-18 ENCOUNTER — Encounter: Payer: Self-pay | Admitting: Family Medicine

## 2009-09-20 ENCOUNTER — Ambulatory Visit: Payer: Self-pay | Admitting: Internal Medicine

## 2009-09-20 ENCOUNTER — Telehealth (INDEPENDENT_AMBULATORY_CARE_PROVIDER_SITE_OTHER): Payer: Self-pay | Admitting: *Deleted

## 2009-09-20 ENCOUNTER — Encounter: Payer: Self-pay | Admitting: Family Medicine

## 2009-09-20 DIAGNOSIS — R000 Tachycardia, unspecified: Secondary | ICD-10-CM

## 2009-09-20 DIAGNOSIS — R4182 Altered mental status, unspecified: Secondary | ICD-10-CM

## 2009-09-23 ENCOUNTER — Telehealth (INDEPENDENT_AMBULATORY_CARE_PROVIDER_SITE_OTHER): Payer: Self-pay | Admitting: *Deleted

## 2009-09-23 LAB — CONVERTED CEMR LAB
Albumin: 4.4 g/dL (ref 3.5–5.2)
Basophils Absolute: 0 10*3/uL (ref 0.0–0.1)
Basophils Relative: 0 % (ref 0–1)
CO2: 27 meq/L (ref 19–32)
Calcium: 9.7 mg/dL (ref 8.4–10.5)
Creatinine, Ser: 1.15 mg/dL (ref 0.40–1.20)
Eosinophils Absolute: 0.4 10*3/uL (ref 0.0–0.7)
Eosinophils Relative: 4 % (ref 0–5)
HCT: 44.1 % (ref 36.0–46.0)
Hemoglobin: 14.8 g/dL (ref 12.0–15.0)
Indirect Bilirubin: 0.2 mg/dL (ref 0.0–0.9)
Lymphocytes Relative: 30 % (ref 12–46)
MCHC: 33.6 g/dL (ref 30.0–36.0)
MCV: 94.4 fL (ref 78.0–100.0)
Monocytes Absolute: 0.8 10*3/uL (ref 0.1–1.0)
RDW: 13.8 % (ref 11.5–15.5)
TSH: 5.338 microintl units/mL — ABNORMAL HIGH (ref 0.350–4.500)
Total Protein: 6.5 g/dL (ref 6.0–8.3)

## 2009-09-24 ENCOUNTER — Encounter: Payer: Self-pay | Admitting: Family Medicine

## 2009-09-25 LAB — CONVERTED CEMR LAB: Hgb A1c MFr Bld: 6.3 % — ABNORMAL HIGH (ref <5.7)

## 2009-09-26 ENCOUNTER — Telehealth: Payer: Self-pay | Admitting: Family Medicine

## 2009-10-03 ENCOUNTER — Ambulatory Visit: Payer: Self-pay | Admitting: Family Medicine

## 2009-10-10 ENCOUNTER — Telehealth: Payer: Self-pay | Admitting: Pulmonary Disease

## 2009-10-10 ENCOUNTER — Encounter: Payer: Self-pay | Admitting: Family Medicine

## 2009-10-23 ENCOUNTER — Telehealth: Payer: Self-pay | Admitting: Family Medicine

## 2009-10-23 ENCOUNTER — Ambulatory Visit: Payer: Self-pay | Admitting: Family Medicine

## 2009-10-23 DIAGNOSIS — M549 Dorsalgia, unspecified: Secondary | ICD-10-CM | POA: Insufficient documentation

## 2009-10-23 LAB — CONVERTED CEMR LAB
Ketones, urine, test strip: NEGATIVE
Nitrite: NEGATIVE
Specific Gravity, Urine: 1.02

## 2009-10-24 ENCOUNTER — Encounter: Payer: Self-pay | Admitting: Family Medicine

## 2009-10-24 ENCOUNTER — Encounter: Admission: RE | Admit: 2009-10-24 | Discharge: 2009-10-24 | Payer: Self-pay | Admitting: Family Medicine

## 2009-10-25 ENCOUNTER — Telehealth: Payer: Self-pay | Admitting: Family Medicine

## 2009-10-29 ENCOUNTER — Telehealth (INDEPENDENT_AMBULATORY_CARE_PROVIDER_SITE_OTHER): Payer: Self-pay | Admitting: *Deleted

## 2009-10-29 ENCOUNTER — Ambulatory Visit: Payer: Self-pay | Admitting: Family Medicine

## 2009-10-29 DIAGNOSIS — R413 Other amnesia: Secondary | ICD-10-CM

## 2009-10-30 ENCOUNTER — Encounter: Payer: Self-pay | Admitting: Family Medicine

## 2009-10-31 ENCOUNTER — Telehealth: Payer: Self-pay | Admitting: Family Medicine

## 2009-11-01 ENCOUNTER — Telehealth (INDEPENDENT_AMBULATORY_CARE_PROVIDER_SITE_OTHER): Payer: Self-pay | Admitting: *Deleted

## 2009-11-07 ENCOUNTER — Ambulatory Visit: Payer: Self-pay | Admitting: Pulmonary Disease

## 2009-11-07 ENCOUNTER — Ambulatory Visit: Payer: Self-pay | Admitting: Family Medicine

## 2009-11-07 DIAGNOSIS — G4733 Obstructive sleep apnea (adult) (pediatric): Secondary | ICD-10-CM

## 2009-11-08 ENCOUNTER — Telehealth (INDEPENDENT_AMBULATORY_CARE_PROVIDER_SITE_OTHER): Payer: Self-pay | Admitting: *Deleted

## 2009-11-08 ENCOUNTER — Encounter: Payer: Self-pay | Admitting: Family Medicine

## 2009-11-12 ENCOUNTER — Telehealth: Payer: Self-pay | Admitting: Gastroenterology

## 2009-11-19 ENCOUNTER — Encounter: Payer: Self-pay | Admitting: Pulmonary Disease

## 2009-11-20 ENCOUNTER — Encounter: Payer: Self-pay | Admitting: Pulmonary Disease

## 2009-11-21 ENCOUNTER — Encounter: Payer: Self-pay | Admitting: Family Medicine

## 2009-11-25 ENCOUNTER — Telehealth: Payer: Self-pay | Admitting: Family Medicine

## 2009-12-03 ENCOUNTER — Encounter: Payer: Self-pay | Admitting: Family Medicine

## 2009-12-17 ENCOUNTER — Encounter (INDEPENDENT_AMBULATORY_CARE_PROVIDER_SITE_OTHER): Payer: Self-pay | Admitting: *Deleted

## 2009-12-17 ENCOUNTER — Encounter: Payer: Self-pay | Admitting: Pulmonary Disease

## 2009-12-17 ENCOUNTER — Ambulatory Visit: Payer: Self-pay | Admitting: Gastroenterology

## 2009-12-17 DIAGNOSIS — K219 Gastro-esophageal reflux disease without esophagitis: Secondary | ICD-10-CM

## 2009-12-17 DIAGNOSIS — K9089 Other intestinal malabsorption: Secondary | ICD-10-CM

## 2009-12-17 LAB — CONVERTED CEMR LAB
Magnesium: 1.7 mg/dL (ref 1.5–2.5)
Saturation Ratios: 16.9 % — ABNORMAL LOW (ref 20.0–50.0)

## 2010-01-06 ENCOUNTER — Ambulatory Visit: Payer: Self-pay | Admitting: Pulmonary Disease

## 2010-01-11 ENCOUNTER — Encounter: Payer: Self-pay | Admitting: Pulmonary Disease

## 2010-01-21 ENCOUNTER — Ambulatory Visit: Payer: Self-pay | Admitting: Gastroenterology

## 2010-01-27 ENCOUNTER — Telehealth (INDEPENDENT_AMBULATORY_CARE_PROVIDER_SITE_OTHER): Payer: Self-pay | Admitting: *Deleted

## 2010-02-13 ENCOUNTER — Other Ambulatory Visit: Payer: Self-pay | Admitting: Family Medicine

## 2010-02-13 ENCOUNTER — Ambulatory Visit
Admission: RE | Admit: 2010-02-13 | Discharge: 2010-02-13 | Payer: Self-pay | Source: Home / Self Care | Attending: Family Medicine | Admitting: Family Medicine

## 2010-02-13 DIAGNOSIS — R109 Unspecified abdominal pain: Secondary | ICD-10-CM | POA: Insufficient documentation

## 2010-02-13 LAB — BASIC METABOLIC PANEL
BUN: 18 mg/dL (ref 6–23)
CO2: 28 mEq/L (ref 19–32)
Calcium: 9.5 mg/dL (ref 8.4–10.5)
Chloride: 98 mEq/L (ref 96–112)
Creatinine, Ser: 1 mg/dL (ref 0.4–1.2)
GFR: 56.01 mL/min — ABNORMAL LOW (ref >60.00)
Glucose, Bld: 118 mg/dL — ABNORMAL HIGH (ref 70–99)
Potassium: 4.3 mEq/L (ref 3.5–5.1)
Sodium: 138 mEq/L (ref 135–145)

## 2010-02-13 LAB — HEPATIC FUNCTION PANEL
ALT: 36 U/L — ABNORMAL HIGH (ref 0–35)
AST: 53 U/L — ABNORMAL HIGH (ref 0–37)
Albumin: 4.1 g/dL (ref 3.5–5.2)
Alkaline Phosphatase: 68 U/L (ref 39–117)
Bilirubin, Direct: 0.1 mg/dL (ref 0.0–0.3)
Total Bilirubin: 0.9 mg/dL (ref 0.3–1.2)
Total Protein: 6.7 g/dL (ref 6.0–8.3)

## 2010-02-13 LAB — CBC WITH DIFFERENTIAL/PLATELET
Basophils Absolute: 0 10*3/uL (ref 0.0–0.1)
Basophils Relative: 0.4 % (ref 0.0–3.0)
Eosinophils Absolute: 0.2 10*3/uL (ref 0.0–0.7)
Eosinophils Relative: 2.4 % (ref 0.0–5.0)
HCT: 43.3 % (ref 36.0–46.0)
Hemoglobin: 14.8 g/dL (ref 12.0–15.0)
Lymphocytes Relative: 30.4 % (ref 12.0–46.0)
Lymphs Abs: 2.7 10*3/uL (ref 0.7–4.0)
MCHC: 34.3 g/dL (ref 30.0–36.0)
MCV: 96 fl (ref 78.0–100.0)
Monocytes Absolute: 0.8 10*3/uL (ref 0.1–1.0)
Monocytes Relative: 9.1 % (ref 3.0–12.0)
Neutro Abs: 5.1 10*3/uL (ref 1.4–7.7)
Neutrophils Relative %: 57.7 % (ref 43.0–77.0)
Platelets: 327 10*3/uL (ref 150.0–400.0)
RBC: 4.51 Mil/uL (ref 3.87–5.11)
RDW: 14.4 % (ref 11.5–14.6)
WBC: 8.9 10*3/uL (ref 4.5–10.5)

## 2010-02-13 LAB — LIPASE: Lipase: 20 U/L (ref 11.0–59.0)

## 2010-02-13 LAB — AMYLASE: Amylase: 25 U/L — ABNORMAL LOW (ref 27–131)

## 2010-02-14 ENCOUNTER — Encounter: Payer: Self-pay | Admitting: Family Medicine

## 2010-02-14 LAB — CONVERTED CEMR LAB
Blood in Urine, dipstick: NEGATIVE
Ketones, urine, test strip: NEGATIVE
Nitrite: NEGATIVE
Urobilinogen, UA: 0.2

## 2010-02-19 ENCOUNTER — Encounter: Payer: Self-pay | Admitting: Family Medicine

## 2010-03-10 ENCOUNTER — Telehealth (INDEPENDENT_AMBULATORY_CARE_PROVIDER_SITE_OTHER): Payer: Self-pay | Admitting: *Deleted

## 2010-03-10 ENCOUNTER — Ambulatory Visit
Admission: RE | Admit: 2010-03-10 | Discharge: 2010-03-10 | Payer: Self-pay | Source: Home / Self Care | Attending: Family Medicine | Admitting: Family Medicine

## 2010-03-10 DIAGNOSIS — A6000 Herpesviral infection of urogenital system, unspecified: Secondary | ICD-10-CM | POA: Insufficient documentation
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
## 2010-03-11 NOTE — Assessment & Plan Note (Signed)
Summary: BACKACHE X3 DAYS, SORE THROAT, STUFFY NOSE///SPH   Vital Signs:  Patient profile:   70 year old female Height:      67 inches Weight:      221.6 pounds O2 Sat:      94 % on Room air Temp:     98.7 degrees F oral Pulse rate:   110 / minute Pulse rhythm:   regular BP sitting:   132 / 90  (right arm)  Vitals Entered By: Almeta Monas CMA Duncan Dull) (October 23, 2009 9:36 AM)  O2 Flow:  Room air CC: C/O sinus pressure, ear pressure, cough, wheezing , chest tightness and lower back pain, URI symptoms   History of Present Illness:       This is a 70 year old woman who presents with URI symptoms.  The symptoms began 2 weeks ago.  Pt used abx drops in ears since x 2-3 days.  The patient complains of nasal congestion, purulent nasal discharge, sore throat, dry cough, earache, and sick contacts, but denies productive cough.  Associated symptoms include wheezing.  The patient denies fever, low-grade fever (<100.5 degrees), fever of 100.5-103 degrees, fever of 103.1-104 degrees, fever to >104 degrees, stiff neck, dyspnea, rash, vomiting, diarrhea, use of an antipyretic, and response to antipyretic.  The patient also reports headache and muscle aches.  The patient denies itchy watery eyes, itchy throat, sneezing, seasonal symptoms, response to antihistamine, and severe fatigue.  The patient denies the following risk factors for Strep sinusitis: unilateral facial pain, unilateral nasal discharge, poor response to decongestant, double sickening, tooth pain, Strep exposure, tender adenopathy, and absence of cough.  Her back bothers her if she stands too long as well.   Current Medications (verified): 1)  Fluvoxamine Maleate 100 Mg Tabs (Fluvoxamine Maleate) .Marland Kitchen.. 1 By Mouth Two Times A Day 2)  Nexium 40 Mg Cpdr (Esomeprazole Magnesium) .... Take 1 By Mouth Once Daily 3)  Allegra 180 Mg  Tabs (Fexofenadine Hcl) .Marland Kitchen.. 1 By Mouth Once Daily 4)  Nasonex 50 Mcg/act  Susp (Mometasone Furoate) .... 2  Sprays Each Nostril Once Daily 5)  Benicar 20 Mg Tabs (Olmesartan Medoxomil) .Marland Kitchen.. 1 By Mouth Once Daily 6)  Metformin Hcl 500 Mg Tabs (Metformin Hcl) .Marland Kitchen.. 1 By Mouth Bid 7)  Synthroid 125 Mcg Tabs (Levothyroxine Sodium) .Marland Kitchen.. 1 By Mouth Once Daily 8)  Fosamax 70 Mg Tabs (Alendronate Sodium) .Marland Kitchen.. 1 By Mouth Once Weekly. 9)  Allopurinol 300 Mg Tabs (Allopurinol) .Marland Kitchen.. 1 By Mouth Once Daily 10)  Celebrex 200 Mg Caps (Celecoxib) .Marland Kitchen.. 1 By Mouth Once Daily 11)  Ultram 50 Mg Tabs (Tramadol Hcl) .Marland Kitchen.. 1-2  By Mouth Every 6 Hrs As Needed. 12)  Biotin 10 Mg Tabs (Biotin) .... Once Daily 13)  Multivitamins  Tabs (Multiple Vitamin) .... Once Daily 14)  Clotrimazole-Betamethasone 1-0.05 % Crea (Clotrimazole-Betamethasone) .... Apply Bid 15)  Klonopin 0.5 Mg Tabs (Clonazepam) .Marland Kitchen.. 1 By Mouth Two Times A Day 16)  Rozerem 8 Mg Tabs (Ramelteon) .Marland Kitchen.. 1 By Mouth At Bedtime 17)  Augmentin 875-125 Mg Tabs (Amoxicillin-Pot Clavulanate) .Marland Kitchen.. 1 By Mouth Two Times A Day  Allergies (verified): 1)  ! Codeine  Past History:  Past medical, surgical, family and social histories (including risk factors) reviewed for relevance to current acute and chronic problems.  Past Medical History: Reviewed history from 08/07/2009 and no changes required. Obsessive compulsive disorder GERD Hiatal hernia Candidal esophagitis Diverticulosis Colon polyps Microscopic colitis 2nd to NSAID use Hypertension Hyperlipidemia Diabetes mellitus, type II  Hypothyroidism Chronic rhinitis Gout Osteoporosis Insomnia Encephalopathy 2009 with ?viral menigitis Ataxia Vertigo Shingles  Past Surgical History: Reviewed history from 08/07/2009 and no changes required. Carpal Tunnel-2005 sinusitis signs ---sinus surgery in 2000 Colonoscopy-1991 L3-L4 laminectomy-12/24/2005 Colon - 04/26/2006  Family History: Reviewed history from 12/31/2008 and no changes required. Family History Depression Family History Diabetes 1st degree  relative Family History Other cancer-Breast, Colon, Kidney M--alz dementia--died 2008-- 72yo  Social History: Reviewed history from 07/14/2006 and no changes required. Never Smoked Alcohol use-no Drug use-no Regular exercise-yes  Review of Systems      See HPI  Physical Exam  General:  Well-developed,well-nourished,in no acute distress; alert,appropriate and cooperative throughout examination Ears:  + fluid both ears Nose:  L frontal sinus tenderness, L maxillary sinus tenderness, R frontal sinus tenderness, and R maxillary sinus tenderness.   Mouth:  Oral mucosa and oropharynx without lesions or exudates.  Teeth in good repair. Neck:  No deformities, masses, or tenderness noted. Lungs:  Normal respiratory effort, chest expands symmetrically. Lungs are clear to auscultation, no crackles or wheezes. Heart:  normal rate and no murmur.   Cervical Nodes:  No lymphadenopathy noted Psych:  Cognition and judgment appear intact. Alert and cooperative with normal attention span and concentration. No apparent delusions, illusions, hallucinations   Impression & Recommendations:  Problem # 1:  SINUSITIS - ACUTE-NOS (ICD-461.9)  Her updated medication list for this problem includes:    Nasonex 50 Mcg/act Susp (Mometasone furoate) .Marland Kitchen... 2 sprays each nostril once daily    Augmentin 875-125 Mg Tabs (Amoxicillin-pot clavulanate) .Marland Kitchen... 1 by mouth two times a day      allegra  180 1 by mouth once daily    Instructed on treatment. Call if symptoms persist or worsen.   Complete Medication List: 1)  Fluvoxamine Maleate 100 Mg Tabs (Fluvoxamine maleate) .Marland Kitchen.. 1 by mouth two times a day 2)  Nexium 40 Mg Cpdr (Esomeprazole magnesium) .... Take 1 by mouth once daily 3)  Allegra Allergy 180 Mg Tabs (Fexofenadine hcl) .Marland Kitchen.. 1 by mouth once daily 4)  Nasonex 50 Mcg/act Susp (Mometasone furoate) .... 2 sprays each nostril once daily 5)  Benicar 20 Mg Tabs (Olmesartan medoxomil) .Marland Kitchen.. 1 by mouth once  daily 6)  Metformin Hcl 500 Mg Tabs (Metformin hcl) .Marland Kitchen.. 1 by mouth bid 7)  Synthroid 125 Mcg Tabs (Levothyroxine sodium) .Marland Kitchen.. 1 by mouth once daily 8)  Fosamax 70 Mg Tabs (Alendronate sodium) .Marland Kitchen.. 1 by mouth once weekly. 9)  Allopurinol 300 Mg Tabs (Allopurinol) .Marland Kitchen.. 1 by mouth once daily 10)  Celebrex 200 Mg Caps (Celecoxib) .Marland Kitchen.. 1 by mouth once daily 11)  Ultram 50 Mg Tabs (Tramadol hcl) .Marland Kitchen.. 1-2  by mouth every 6 hrs as needed. 12)  Biotin 10 Mg Tabs (Biotin) .... Once daily 13)  Multivitamins Tabs (Multiple vitamin) .... Once daily 14)  Clotrimazole-betamethasone 1-0.05 % Crea (Clotrimazole-betamethasone) .... Apply bid 15)  Klonopin 0.5 Mg Tabs (Clonazepam) .Marland Kitchen.. 1 by mouth two times a day 16)  Rozerem 8 Mg Tabs (Ramelteon) .Marland Kitchen.. 1 by mouth at bedtime 17)  Augmentin 875-125 Mg Tabs (Amoxicillin-pot clavulanate) .Marland Kitchen.. 1 by mouth two times a day  Patient Instructions: 1)  rto flu shot Prescriptions: AUGMENTIN 875-125 MG TABS (AMOXICILLIN-POT CLAVULANATE) 1 by mouth two times a day  #20 x 0   Entered and Authorized by:   Loreen Freud DO   Signed by:   Loreen Freud DO on 10/23/2009   Method used:   Electronically to  [id number] (retail)       [address]       [place], [place]  [address]       Ph: [phone] or [phone]       Fax: [phone]   RxID:   [id number]   Laboratory Results   Urine Tests    Routine Urinalysis   Color: lt. yellow Appearance: Hazy Glucose: negative   (Normal Range: Negative) Bilirubin: negative   (Normal Range: Negative) Ketone: negative   (Normal Range: Negative) Spec. Gravity: 1.020   (Normal Range: 1.003-1.035) Blood: trace-lysed   (Normal Range: Negative) pH: 5.0   (Normal Range: 5.0-8.0) Protein: 30   (Normal Range: Negative) Urobilinogen: 0.2   (Normal Range: 0-1) Nitrite: negative   (Normal Range: Negative) Leukocyte Esterace: moderate   (Normal Range: Negative)       Appended Document: Orders  Update    Clinical Lists Changes  Problems: Added new problem of BACK PAIN (ICD-724.5) Orders: Added new Test order of T-Culture, Urine ([id number]) - Signed      Appended Document: BACKACHE X3 DAYS, SORE THROAT, STUFFY NOSE///SPH

## 2010-03-11 NOTE — Letter (Signed)
Summary: [facility]  [facility]   Imported By: Lanelle Bal 12/03/2009 11:51:55  _____________________________________________________________________  External Attachment:    Type:   Image     Comment:   External Document
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
## 2010-03-11 NOTE — Progress Notes (Signed)
Summary: appt [id number]  Phone Note Outgoing Call   Call placed by: Army Fossa CMA,  Jun 28, 2009 8:17 AM Summary of Call: Pt needs appt for BP follow up. Army Fossa CMA  Jun 28, 2009 8:17 AM   Follow-up for Phone Call        appt schedlued [id number] Follow-up by: Okey Regal [place],  Jun 28, 2009 8:35 AM

## 2010-03-11 NOTE — Progress Notes (Signed)
Summary: prior auth APPROVED BENICAR  Phone Note Refill Request   Refills Requested: Medication #1:  BENICAR 20 MG TABS 1 by mouth once daily prior auth - [id number] --- [facility] [address] [facility] [phone]  Initial call taken by: Dannielle Karvonen,  September 16, 2009 1:23 PM  Follow-up for Phone Call        Spoke with ins co, and preferred alternatives are Losartan, Diovan, and Micardis. Please advise. Lucious Groves CMA  September 16, 2009 4:34 PM   Additional Follow-up for Phone Call Additional follow up Details #1::        pt has been on benicar forever---her bp is controlled---why are they making her change now!!! Additional Follow-up by: Loreen Freud DO,  September 16, 2009 4:57 PM    Additional Follow-up for Phone Call Additional follow up Details #2::    PRIOR AUTH INITIATED...........Marland KitchenFelecia Deloach CMA  September 18, 2009 9:51 AM   prior auth faxed back............Marland KitchenFelecia Deloach CMA  September 18, 2009 3:18 PM  Prior auth approved from 09-19-09 with no expiration date, pharmacy notified via phone call.................Marland KitchenFelecia Deloach CMA  September 20, 2009 8:42 AM

## 2010-03-11 NOTE — Assessment & Plan Note (Signed)
Summary: hosp follow up/cbs   Vital Signs:  Patient profile:   70 year old female Weight:      213 pounds Pulse rate:   80 / minute Pulse rhythm:   regular BP sitting:   124 / 80  (left arm) Cuff size:   regular  Vitals Entered By: Army Fossa CMA (July 26, 2009 1:17 PM) CC: Pt here for Hospital follow up. She also states she is taking 3 ambien a night   History of Present Illness: Pt is here for f/u hospital-- for concussion, UTI.  Pt now feels fine.  No more episodes of confusion.  pt was given Remus Loffler when she was d/cd from hospital -- it does not work.  Pt went back to samples of rozarem 8 mg but it takes 3 to get her to sleep.    Current Medications (verified): 1)  Fluvoxamine Maleate 100 Mg Tabs (Fluvoxamine Maleate) .Marland Kitchen.. 1 By Mouth Two Times A Day 2)  Nexium 40 Mg Cpdr (Esomeprazole Magnesium) .... Take 1 By Mouth Once Daily 3)  Allegra 180 Mg  Tabs (Fexofenadine Hcl) .Marland Kitchen.. 1 By Mouth Once Daily 4)  Nasonex 50 Mcg/act  Susp (Mometasone Furoate) .... 2 Sprays Each Nostril Once Daily 5)  Hydrochlorothiazide 25 Mg Tabs (Hydrochlorothiazide) .... 1/2 By Mouth Once Daily 6)  Synthroid 200 Mcg Tabs (Levothyroxine Sodium) .Marland Kitchen.. 1 By Mouth Qd 7)  Multivitamins  Tabs (Multiple Vitamin) .... Once Daily 8)  Biotin 10 Mg Tabs (Biotin) .... Once Daily 9)  Allopurinol 300 Mg Tabs (Allopurinol) .Marland Kitchen.. 1 By Mouth Once Daily 10)  Celebrex 200 Mg Caps (Celecoxib) .Marland Kitchen.. 1 By Mouth Once Daily 11)  Fosamax 70 Mg Tabs (Alendronate Sodium) .Marland Kitchen.. 1 By Mouth Once Weekly. 12)  Metformin Hcl 500 Mg Tabs (Metformin Hcl) .Marland Kitchen.. 1 By Mouth Bid 13)  Ultram 50 Mg Tabs (Tramadol Hcl) .Marland Kitchen.. 1-2  By Mouth Every 6 Hrs As Needed. 14)  Ambien 10 Mg Tabs (Zolpidem Tartrate) .Marland Kitchen.. 1 By Mouth At Bedtime 15)  Rozerem 8 Mg Tabs (Ramelteon) .Marland Kitchen.. 1 By Mouth At Bedtime Prn 16)  Lipitor 10 Mg Tabs (Atorvastatin Calcium) .Marland Kitchen.. 1 By Mouth At Bedtime 17)  Clotrimazole-Betamethasone 1-0.05 % Crea (Clotrimazole-Betamethasone) ....  Apply Bid  Allergies: 1)  ! 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) FATIGUE (ICD-780.79) OSTEOPOROSIS (ICD-733.00)  SHINGLES (ICD-053.9)  ACCIDENTAL FALL NOS (ICD-E888.9) FASTING HYPERGLYCEMIA (ICD-790.6) FAMILY HISTORY DIABETES 1ST DEGREE RELATIVE (ICD-V18.0) FAMILY HISTORY DEPRESSION (ICD-V17.0) SOB (ICD-786.05) LEG EDEMA, BILATERAL (ICD-782.3) OBSESSIVE-COMPULSIVE DISORDER (ICD-300.3) HYPOTHYROIDISM (ICD-244.9) HYPERTENSION (ICD-401.9) GOUT (ICD-274.9)  Past Surgical History: Last updated: 06/14/2007 Carpal Tunnel-2005 sinusitis sx Colonoscopy-1991 Back sx-12/24/2005 Colon - 04/26/2006  Family History: Last updated: 12/31/2008 Family History Depression Family History Diabetes 1st degree relative Family History Other cancer-Breast, Colon, Kidney M--alz dementia--died 2008-- 72yo  Social History: Last updated: 07/14/2006 Never Smoked Alcohol use-no Drug use-no Regular exercise-yes  Risk Factors: Alcohol Use: 0 (12/31/2008) Caffeine Use: 0 (12/31/2008) Exercise: no (12/31/2008)  Risk Factors: Smoking Status: never (12/31/2008)  Review of Systems      See HPI  Physical Exam  General:  Well-developed,well-nourished,in no acute distress; alert,appropriate and  cooperative throughout examination Lungs:  Normal respiratory effort, chest expands  symmetrically. Lungs are clear to auscultation, no crackles or wheezes. Heart:  normal rate and no murmur.   Extremities:  No clubbing, cyanosis, edema, or deformity noted with normal full range of motion of all joints.   Neurologic:  alert & oriented X3, cranial nerves II-XII intact, and strength normal in all extremities.   Skin:  Intact without suspicious lesions or rashes Psych:  Oriented X3, memory intact for recent and remote, normally interactive, and good eye contact.     Impression & Recommendations:  Problem # 1:  Hx of CONCUSSION WITH NO LOSS OF CONSCIOUSNESS (ICD-850.0) Assessment Improved  Pt with no more symptoms rto prn  Orders: UA Dipstick w/o Micro (manual) (16073)  Problem # 2:  Hx of UTI (ICD-599.0) Assessment: Improved  UA normal today pt asymptomatic  Orders: UA Dipstick w/o Micro (manual) (71062)  Problem # 3:  DIABETES MELLITUS, TYPE II (ICD-250.00)  per endo Her updated medication list for this problem includes:    Metformin Hcl 500 Mg Tabs (Metformin hcl) .Marland Kitchen... 1 by mouth bid  Labs Reviewed: Creat: 0.8 (07/04/2009)    Reviewed HgBA1c results: 6.5 (07/04/2009)  5.9 (12/31/2008)  Orders: UA Dipstick w/o Micro (manual) ([id number])  Problem # 4:  INSOMNIA-SLEEP DISORDER-UNSPEC (ICD-780.52)  Her updated medication list for this problem includes:    Ambien 10 Mg Tabs (Zolpidem tartrate) .Marland Kitchen... 1 by mouth at bedtime    Rozerem 8 Mg Tabs (Ramelteon) .Marland Kitchen... 1 by mouth at bedtime prn  Orders: Sleep Disorder Referral (Sleep Disorder) UA Dipstick w/o Micro (manual) (46270)  Discussed sleep hygiene.   Problem # 5:  HYPOTHYROIDISM (ICD-244.9)  Her updated medication list for this problem includes:    Synthroid 200 Mcg Tabs (Levothyroxine sodium) .Marland Kitchen... 1 by mouth qd  Orders: Venipuncture (35009) UA Dipstick w/o Micro (manual) (38182)  Labs Reviewed: TSH: 20.58 (12/31/2008)   Free T4: 0.8 (03/31/2006)    HgBA1c: 6.5 (07/04/2009) Chol: 197  (07/04/2009)   HDL: 55.40 (07/04/2009)   LDL: 103 (07/04/2009)   TG: 191.0 (07/04/2009)  Problem # 6:  HYPERTENSION (ICD-401.9)  Her updated medication list for this problem includes:    Hydrochlorothiazide 25 Mg Tabs (Hydrochlorothiazide) .Marland Kitchen... 1/2 by mouth once daily  BP today: 124/80 Prior BP: 126/76 (07/02/2009)  Labs Reviewed: K+: 4.0 (07/04/2009) Creat: : 0.8 (07/04/2009)   Chol: 197 (07/04/2009)   HDL: 55.40 (07/04/2009)   LDL: 103 (07/04/2009)   TG: 191.0 (07/04/2009)  Orders: UA Dipstick w/o Micro (manual) ([id number])  Complete Medication List: 1)  Fluvoxamine Maleate 100 Mg Tabs (Fluvoxamine maleate) .Marland Kitchen.. 1 by mouth two times a day 2)  Nexium 40 Mg Cpdr (Esomeprazole magnesium) .... Take 1 by mouth once daily 3)  Allegra 180 Mg Tabs (Fexofenadine hcl) .Marland Kitchen.. 1 by mouth once daily 4)  Nasonex 50 Mcg/act Susp (Mometasone furoate) .... 2 sprays each nostril once daily 5)  Hydrochlorothiazide 25 Mg Tabs (Hydrochlorothiazide) .... 1/2 by mouth once daily 6)  Synthroid 200 Mcg Tabs (Levothyroxine sodium) .Marland Kitchen.. 1 by mouth qd 7)  Multivitamins Tabs (Multiple vitamin) .... Once daily 8)  Biotin 10 Mg Tabs (Biotin) .... Once daily 9)  Allopurinol 300 Mg Tabs (Allopurinol) .Marland Kitchen.. 1 by mouth once daily 10)  Celebrex 200 Mg Caps (Celecoxib) .Marland Kitchen.. 1 by mouth once daily 11)  Fosamax 70 Mg Tabs (Alendronate sodium) .Marland Kitchen.. 1 by mouth once weekly. 12)  Metformin Hcl 500 Mg Tabs (Metformin hcl) .Marland Kitchen.. 1 by mouth bid 13)  Ultram 50 Mg Tabs (Tramadol hcl) .Marland Kitchen.. 1-2  by mouth every 6 hrs as needed. 14)  Ambien 10 Mg Tabs (Zolpidem tartrate) .Marland Kitchen.. 1 by mouth at bedtime 15)  Rozerem 8 Mg Tabs (Ramelteon) .Marland Kitchen.. 1 by mouth at bedtime prn 16)  Lipitor 10 Mg Tabs (Atorvastatin calcium) .Marland Kitchen.. 1 by mouth at bedtime 17)  Clotrimazole-betamethasone 1-0.05 % Crea (Clotrimazole-betamethasone) .... Apply bid  Patient Instructions: 1)  fasting labs in 3 months----272.4 250.0 244.9  tsh, bmp, hgba1c, lipid, hep    Prescriptions: CLOTRIMAZOLE-BETAMETHASONE 1-0.05 % CREA (CLOTRIMAZOLE-BETAMETHASONE) apply bid  #30g x 2   Entered and Authorized by:   Loreen Freud DO   Signed by:   Loreen Freud DO on 07/26/2009   Method used:   Electronically to        [id number] (retail)       [address]       [place], [place]  [address]       Ph: [phone] or [phone]       Fax: [phone]   RxID:   [id number] LIPITOR 10 MG TABS (ATORVASTATIN CALCIUM) 1 by mouth at bedtime  #30 x 2   Entered and Authorized by:   Loreen Freud DO   Signed by:   Loreen Freud DO on 07/26/2009   Method used:   Print then Give to Patient   RxID:   [id number]   Laboratory Results   Urine Tests    Routine Urinalysis   Color: yellow Appearance: Clear Glucose: negative   (Normal Range: Negative) Bilirubin: negative   (Normal Range: Negative) Ketone: negative   (Normal Range: Negative) Spec. Gravity: 1.015   (Normal Range: 1.003-1.035) Blood: negative   (Normal Range: Negative) pH: 6.5   (Normal Range: 5.0-8.0) Protein: negative   (Normal Range: Negative) Urobilinogen: 0.2   (Normal Range: 0-1) Nitrite: negative   (Normal Range: Negative) Leukocyte Esterace: negative   (Normal Range: Negative)

## 2010-03-11 NOTE — Assessment & Plan Note (Signed)
Summary: pt confused/cbs   Vital Signs:  Patient profile:   70 year old female Weight:      219.8 pounds Temp:     98.4 degrees F oral Pulse rate:   114 / minute Resp:     17 per minute BP sitting:   104 / 68  (left arm) Cuff size:   large  Vitals Entered By: Shonna Chock CMA (September 20, 2009 2:48 PM) CC: Patient is confused and feels at times she has no control of her body   Primary Care Jerlyn Pain:  Loreen Freud, DO  CC:  Patient is confused and feels at times she has no control of her body.  History of Present Illness: Mrs.Study is here accompanied by her daughter , Leonia Corona. She states  is concerned about  her BP being high  & then she mentions being concerned about low BP.  Her ARB dose has been changed @ last 2 appts; she states she hasn't taken it for 2 weeks. She did not bring in her meds & is confused as to present regimen.Actually her daughter made her come in due to " being out of it". She verified her mother has been compliant with meds as per pill count, but she too is not sure of validity of present med list. OV 7/11 & 08/01  and labs 07/11 reviewed; ARB had been reduced to 20 mg once daily @ last visit , but as noted she states not taken . TSH was 0.8 on 7/11 & 0.04 on 06/29. Thyroid dose decreased  to  "?  137". PMH negative for thyroid malignancy so TSH suppression should not be necessary. Confusion evaluated with MMSE & Clock Drawing tests (see Neuro)  Current Medications (verified): 1)  Fluvoxamine Maleate 100 Mg Tabs (Fluvoxamine Maleate) .Marland Kitchen.. 1 By Mouth Two Times A Day 2)  Nexium 40 Mg Cpdr (Esomeprazole Magnesium) .... Take 1 By Mouth Once Daily 3)  Allegra 180 Mg  Tabs (Fexofenadine Hcl) .Marland Kitchen.. 1 By Mouth Once Daily 4)  Nasonex 50 Mcg/act  Susp (Mometasone Furoate) .... 2 Sprays Each Nostril Once Daily 5)  Hydrochlorothiazide 25 Mg Tabs (Hydrochlorothiazide) .... 1/2 By Mouth Once Daily 6)  Benicar 20 Mg Tabs (Olmesartan Medoxomil) .Marland Kitchen.. 1 By Mouth Once Daily 7)   Lipitor 10 Mg Tabs (Atorvastatin Calcium) .Marland Kitchen.. 1 By Mouth At Bedtime 8)  Metformin Hcl 500 Mg Tabs (Metformin Hcl) .Marland Kitchen.. 1 By Mouth Bid 9)  Synthroid 137 Mcg Tabs (Levothyroxine Sodium) .... Take One Tablet Daily 10)  Fosamax 70 Mg Tabs (Alendronate Sodium) .Marland Kitchen.. 1 By Mouth Once Weekly. 11)  Allopurinol 300 Mg Tabs (Allopurinol) .Marland Kitchen.. 1 By Mouth Once Daily 12)  Celebrex 200 Mg Caps (Celecoxib) .Marland Kitchen.. 1 By Mouth Once Daily 13)  Ultram 50 Mg Tabs (Tramadol Hcl) .Marland Kitchen.. 1-2  By Mouth Every 6 Hrs As Needed. 14)  Biotin 10 Mg Tabs (Biotin) .... Once Daily 15)  Multivitamins  Tabs (Multiple Vitamin) .... Once Daily 16)  Clotrimazole-Betamethasone 1-0.05 % Crea (Clotrimazole-Betamethasone) .... Apply Bid 17)  Klonopin 0.5 Mg Tabs (Clonazepam) .Marland Kitchen.. 1 By Mouth Two Times A Day 18)  Rozerem 8 Mg Tabs (Ramelteon) .Marland Kitchen.. 1 By Mouth At Bedtime  Allergies: 1)  ! Codeine  Review of Systems Psych:  Complains of anxiety, depression, easily angered, easily tearful, and irritability; denies suicidal thoughts/plans; "I'd fight to keep my Fluvoxamine". Black Box Warning on this drug shared with her daughter.She apparently  did not keep F/U with Dr Evelene Croon  as previously recommended.  Physical Exam  General:  in no acute distress;  slightly confused but  cooperative throughout examination. Somewhat mask facies Eyes:  No corneal or conjunctival inflammation noted. EOMI. Perrla. Field of  Vision grossly normal. Mouth:  Oral mucosa and oropharynx without lesions or exudates.  Teeth in good repair. Tongue w/o deviation Neck:  No deformities, masses, or tenderness noted. Lungs:  Normal respiratory effort, chest expands symmetrically. Lungs are clear to auscultation, no crackles or wheezes. Heart:  regular rhythm, no murmur, no gallop, no rub, no JVD,  but sustained low grade  tachycardia.   Pulses:  R and L carotid pulses are full and equal bilaterally w/o bruits Extremities:  No clubbing, cyanosis, edema. Neurologic:  cranial  nerves II-XII intact, strength normal in all extremities, sensation intact to light touch over face, gait abnormal : choppy with straightened  back & minimal arm swing (somewhat "Parkinsonian"), DTRs symmetrical and normal, finger-to-nose normal, and Romberg negative.  MMSE : 25 of 30. Poor Clock Drawing with "12" written twice with large gap between them.Placement of "hands" borderline correct but dimensions incorrect. Skin:  Intact without suspicious rashes. Damp to touch. Slighly inflmmed senile keratosis L abdomen ( assessment requested Cervical Nodes:  No lymphadenopathy noted Axillary Nodes:  No palpable lymphadenopathy Psych:  flat affect and subdued.  [place] Indifference suggested  clinically   Impression & Recommendations:  Problem # 1:  ALTERED MENTAL STATUS (ICD-780.97) Iatrogenic (drug induced) etiology suggested rather than anatomical process.  Orders: Venipuncture (16109) Specimen Handling (60454)  Problem # 2:  TACHYCARDIA (ICD-785.0) Probably related to Synthroid based on TSH levels Orders: EKG w/ Interpretation (93000)  Problem # 3:  HYPERTENSION (ICD-401.9)  Her updated medication list for this problem includes:    Hydrochlorothiazide 25 Mg Tabs (Hydrochlorothiazide) .Marland Kitchen... 1/2 by mouth once daily    Benicar 20 Mg Tabs (Olmesartan medoxomil) .Marland Kitchen... 1 by mouth once daily  Orders: EKG w/ Interpretation (93000) Venipuncture (09811)  Problem # 4:  HYPOTHYROIDISM (ICD-244.9)  Her updated medication list for this problem includes:    Synthroid 137 Mcg Tabs (Levothyroxine sodium) .Marland Kitchen... Take one tablet daily  Orders: Venipuncture (91478)  Problem # 5:  GOUT (ICD-274.9) Assessment: Comment Only  Her updated medication list for this problem includes:    Allopurinol 300 Mg Tabs (Allopurinol) .Marland Kitchen... 1 by mouth once daily  Orders: Venipuncture (29562) Specimen Handling (13086)  Complete Medication List: 1)  Fluvoxamine Maleate 100 Mg Tabs (Fluvoxamine maleate)  .Marland Kitchen.. 1 by mouth two times a day 2)  Nexium 40 Mg Cpdr (Esomeprazole magnesium) .... Take 1 by mouth once daily 3)  Allegra 180 Mg Tabs (Fexofenadine hcl) .Marland Kitchen.. 1 by mouth once daily 4)  Nasonex 50 Mcg/act Susp (Mometasone furoate) .... 2 sprays each nostril once daily 5)  Hydrochlorothiazide 25 Mg Tabs (Hydrochlorothiazide) .... 1/2 by mouth once daily 6)  Benicar 20 Mg Tabs (Olmesartan medoxomil) .Marland Kitchen.. 1 by mouth once daily 7)  Lipitor 10 Mg Tabs (Atorvastatin calcium) .Marland Kitchen.. 1 by mouth at bedtime 8)  Metformin Hcl 500 Mg Tabs (Metformin hcl) .Marland Kitchen.. 1 by mouth bid 9)  Synthroid 137 Mcg Tabs (Levothyroxine sodium) .... Take one tablet daily 10)  Fosamax 70 Mg Tabs (Alendronate sodium) .Marland Kitchen.. 1 by mouth once weekly. 11)  Allopurinol 300 Mg Tabs (Allopurinol) .Marland Kitchen.. 1 by mouth once daily 12)  Celebrex 200 Mg Caps (Celecoxib) .Marland Kitchen.. 1 by mouth once daily 13)  Ultram 50 Mg Tabs (Tramadol hcl) .Marland Kitchen.. 1-2  by mouth every 6 hrs as needed. 14)  Biotin 10 Mg Tabs (Biotin) .... Once daily 15)  Multivitamins Tabs (Multiple vitamin) .... Once daily 16)  Clotrimazole-betamethasone 1-0.05 % Crea (Clotrimazole-betamethasone) .... Apply bid 17)  Klonopin 0.5 Mg Tabs (Clonazepam) .Marland Kitchen.. 1 by mouth two times a day 18)  Rozerem 8 Mg Tabs (Ramelteon) .Marland Kitchen.. 1 by mouth at bedtime  Patient Instructions: 1)  Stop all meds as noted ( circled on Med Sheet). Continue ONLY the Fluvoxamine, Nexium, & Klonopin. I'm concerned there is seizure risk if these are stopped suddenly. Take pain med as little as possible.Bring ALL actual pill bottles of active & inactive meds to all visits. A Neurochemist should manage the Fluvoxamine due to the narrow risk: benefit "window" as discussed.

## 2010-03-11 NOTE — Progress Notes (Signed)
Summary: pt status  Phone Note Call from Patient Call back at [phone]    Caller: Daughter(JOLYNNE ELLINGTON) Summary of Call: pt daughter states that at pt last OV she was instructed by dr hopper that she need to be present at all future appt. Pt daughter is not going to be able to make pt pending appt on monday due to her being out of town. pt daughter would like for dr Laury Axon to given her a call so that she can update her on what has happen since last ov............Marland KitchenFelecia Deloach CMA  September 26, 2009 12:02 PM   Follow-up for Phone Call        is she aware her mother cancelled her appointment? Follow-up by: Loreen Freud DO,  September 30, 2009 12:14 PM  Additional Follow-up for Phone Call Additional follow up Details #1::        Spk with Haynes Hoehn pt's daughter and she is aware that pt cancelled appt, she advised me that the pt is closing on her home and the closing date keeps changing. The appt has been rescheduled to 10/03/09 @ 11:00.... Almeta Monas CMA Duncan Dull)  September 30, 2009 12:55 PM

## 2010-03-11 NOTE — Assessment & Plan Note (Signed)
Summary: bp low/cbs   Vital Signs:  Patient profile:   70 year old female Height:      67 inches Weight:      214 pounds O2 Sat:      94 % on Room air Pulse rate:   114 / minute BP sitting:   90 / 50  (left arm)  Vitals Entered By: Jeremy Johann CMA (August 19, 2009 2:02 PM)  O2 Flow:  Room air CC: low bp Comments REVIEWED MED LIST, PATIENT AGREED DOSE AND INSTRUCTION CORRECT    History of Present Illness: Pt here c/o anxiety and low bp.  Pt is not sleeping.  No cp, palp, or sob.    Current Medications (verified): 1)  Fluvoxamine Maleate 100 Mg Tabs (Fluvoxamine Maleate) .Marland Kitchen.. 1 By Mouth Two Times A Day 2)  Nexium 40 Mg Cpdr (Esomeprazole Magnesium) .... Take 1 By Mouth Once Daily 3)  Allegra 180 Mg  Tabs (Fexofenadine Hcl) .Marland Kitchen.. 1 By Mouth Once Daily 4)  Nasonex 50 Mcg/act  Susp (Mometasone Furoate) .... 2 Sprays Each Nostril Once Daily 5)  Hydrochlorothiazide 25 Mg Tabs (Hydrochlorothiazide) .... 1/2 By Mouth Once Daily 6)  Benicar 40 Mg Tabs (Olmesartan Medoxomil) .Marland Kitchen.. 1 By Mouth Once Daily 7)  Lipitor 10 Mg Tabs (Atorvastatin Calcium) .Marland Kitchen.. 1 By Mouth At Bedtime 8)  Metformin Hcl 500 Mg Tabs (Metformin Hcl) .Marland Kitchen.. 1 By Mouth Bid 9)  Synthroid 150 Mcg Tabs (Levothyroxine Sodium) .Marland Kitchen.. 1 By Mouth Daily. 10)  Fosamax 70 Mg Tabs (Alendronate Sodium) .Marland Kitchen.. 1 By Mouth Once Weekly. 11)  Allopurinol 300 Mg Tabs (Allopurinol) .Marland Kitchen.. 1 By Mouth Once Daily 12)  Celebrex 200 Mg Caps (Celecoxib) .Marland Kitchen.. 1 By Mouth Once Daily 13)  Ultram 50 Mg Tabs (Tramadol Hcl) .Marland Kitchen.. 1-2  By Mouth Every 6 Hrs As Needed. 14)  Biotin 10 Mg Tabs (Biotin) .... Once Daily 15)  Multivitamins  Tabs (Multiple Vitamin) .... Once Daily 16)  Clotrimazole-Betamethasone 1-0.05 % Crea (Clotrimazole-Betamethasone) .... Apply Bid 17)  Klonopin 0.5 Mg Tabs (Clonazepam) .Marland Kitchen.. 1 By Mouth Two Times A Day  Allergies (verified): 1)  ! Codeine  Past History:  Past medical, surgical, family and social histories (including risk  factors) reviewed for relevance to current acute and chronic problems.  Past Medical History: Reviewed history from 08/07/2009 and no changes required. Obsessive compulsive disorder GERD Hiatal hernia Candidal esophagitis Diverticulosis Colon polyps Microscopic colitis 2nd to NSAID use Hypertension Hyperlipidemia Diabetes mellitus, type II Hypothyroidism Chronic rhinitis Gout Osteoporosis Insomnia Encephalopathy 2009 with ?viral menigitis Ataxia Vertigo Shingles  Past Surgical History: Reviewed history from 08/07/2009 and no changes required. Carpal Tunnel-2005 sinusitis signs ---sinus surgery in 2000 Colonoscopy-1991 L3-L4 laminectomy-12/24/2005 Colon - 04/26/2006  Family History: Reviewed history from 12/31/2008 and no changes required. Family History Depression Family History Diabetes 1st degree relative Family History Other cancer-Breast, Colon, Kidney M--alz dementia--died 2008-- 72yo  Social History: Reviewed history from 07/14/2006 and no changes required. Never Smoked Alcohol use-no Drug use-no Regular exercise-yes  Review of Systems      See HPI  Physical Exam  General:  Well-developed,well-nourished,in no acute distress; alert,appropriate and cooperative throughout examination Neck:  No deformities, masses, or tenderness noted. Lungs:  Normal respiratory effort, chest expands symmetrically. Lungs are clear to auscultation, no crackles or wheezes. Heart:  regular rhythm.   tachy Abdomen:  Bowel sounds positive,abdomen soft and non-tender without masses, organomegaly or hernias noted. Psych:  Oriented X3 and slightly anxious.     Impression & Recommendations:  Problem #  1:  HYPOTENSION (ICD-458.9)  change to benicar 40 mg daily recheck 2-3 weeks or sooner prn  Orders: EKG w/ Interpretation (93000)  Problem # 2:  ANXIETY STATE, UNSPECIFIED (ICD-300.00)  Her updated medication list for this problem includes:    Fluvoxamine Maleate 100 Mg  Tabs (Fluvoxamine maleate) .Marland Kitchen... 1 by mouth two times a day    Klonopin 0.5 Mg Tabs (Clonazepam) .Marland Kitchen... 1 by mouth two times a day  Orders: EKG w/ Interpretation (93000)  Discussed medication use and relaxation techniques.   Problem # 3:  SINUS TACHYCARDIA (ICD-427.89)  check labs--cbcd, tsh pt has no cp/ palp etc Labs Reviewed: Na: 143 (08/07/2009)   K+: 4.2 (08/07/2009)   CL: 105 (08/07/2009)   HCO3: 30 (08/07/2009) Ca: 9.7 (08/07/2009)   TSH: 0.04 (08/07/2009)   HCO3: 30 (08/07/2009)Free T4: 0.8 (03/31/2006)  Orders: EKG w/ Interpretation (93000)  Complete Medication List: 1)  Fluvoxamine Maleate 100 Mg Tabs (Fluvoxamine maleate) .Marland Kitchen.. 1 by mouth two times a day 2)  Nexium 40 Mg Cpdr (Esomeprazole magnesium) .... Take 1 by mouth once daily 3)  Allegra 180 Mg Tabs (Fexofenadine hcl) .Marland Kitchen.. 1 by mouth once daily 4)  Nasonex 50 Mcg/act Susp (Mometasone furoate) .... 2 sprays each nostril once daily 5)  Hydrochlorothiazide 25 Mg Tabs (Hydrochlorothiazide) .... 1/2 by mouth once daily 6)  Benicar 40 Mg Tabs (Olmesartan medoxomil) .Marland Kitchen.. 1 by mouth once daily 7)  Lipitor 10 Mg Tabs (Atorvastatin calcium) .Marland Kitchen.. 1 by mouth at bedtime 8)  Metformin Hcl 500 Mg Tabs (Metformin hcl) .Marland Kitchen.. 1 by mouth bid 9)  Synthroid 150 Mcg Tabs (Levothyroxine sodium) .Marland Kitchen.. 1 by mouth daily. 10)  Fosamax 70 Mg Tabs (Alendronate sodium) .Marland Kitchen.. 1 by mouth once weekly. 11)  Allopurinol 300 Mg Tabs (Allopurinol) .Marland Kitchen.. 1 by mouth once daily 12)  Celebrex 200 Mg Caps (Celecoxib) .Marland Kitchen.. 1 by mouth once daily 13)  Ultram 50 Mg Tabs (Tramadol hcl) .Marland Kitchen.. 1-2  by mouth every 6 hrs as needed. 14)  Biotin 10 Mg Tabs (Biotin) .... Once daily 15)  Multivitamins Tabs (Multiple vitamin) .... Once daily 16)  Clotrimazole-betamethasone 1-0.05 % Crea (Clotrimazole-betamethasone) .... Apply bid 17)  Klonopin 0.5 Mg Tabs (Clonazepam) .Marland Kitchen.. 1 by mouth two times a day  Other Orders: Venipuncture (04540) TLB-BMP (Basic Metabolic Panel-BMET)  (80048-METABOL) TLB-CBC Platelet - w/Differential (85025-CBCD) TLB-TSH (Thyroid Stimulating Hormone) (84443-TSH) TLB-T3, Free (Triiodothyronine) (84481-T3FREE) TLB-T4 (Thyrox), Free [phone])  Patient Instructions: 1)  f/u 2-3 weeks or sooner as needed 2)  if you develop any CP or SOB go to ER Prescriptions: KLONOPIN 0.5 MG TABS (CLONAZEPAM) 1 by mouth two times a day  #60 x 1   Entered and Authorized by:   Loreen Freud DO   Signed by:   Loreen Freud DO on 08/19/2009   Method used:   Print then Give to Patient   RxID:   [id number] ULTRAM 50 MG TABS (TRAMADOL HCL) 1-2  by mouth every 6 hrs as needed.  #60 Tablet x 2   Entered and Authorized by:   Loreen Freud DO   Signed by:   Loreen Freud DO on 08/19/2009   Method used:   Electronically to        [id number]       [address]       [place], [place]  [address]       Ph: [phone] or [phone]       Fax: [phone]   RxID:   [id number]  EKG  Procedure date:  08/19/2009  Findings:      Sinus tachycardia with rate of:  101

## 2010-03-11 NOTE — Medication Information (Signed)
Summary: [facility] & [facility]rem[facility]  [facility]rem[facility]   Imported By: Lanelle Bal 10/24/2009 08:55:59  _____________________________________________________________________  External Attachment:    Type:   Image     Comment:   External Document

## 2010-03-11 NOTE — Assessment & Plan Note (Signed)
Summary: rto 3 week/cbs   Vital Signs:  Patient profile:   70 year old female Height:      67 inches (170.18 cm) Weight:      218.50 pounds (99.32 kg) BMI:     34.35 Temp:     98.0 degrees F (36.67 degrees C) oral Pulse rate:   100 / minute BP sitting:   118 / 70  (right arm) Cuff size:   regular  Vitals Entered By: Lucious Groves CMA (September 09, 2009 1:54 PM) CC: 3 week rtn ov./kb Is Patient Diabetic? No Pain Assessment Patient in pain? no      Comments Patient notes that she is not taking Lipitor and needs refill of Ultram./kb   History of Present Illness: Pt here to f/u from bp.  Pt is feeling better.    Current Medications (verified): 1)  Fluvoxamine Maleate 100 Mg Tabs (Fluvoxamine Maleate) .Marland Kitchen.. 1 By Mouth Two Times A Day 2)  Nexium 40 Mg Cpdr (Esomeprazole Magnesium) .... Take 1 By Mouth Once Daily 3)  Allegra 180 Mg  Tabs (Fexofenadine Hcl) .Marland Kitchen.. 1 By Mouth Once Daily 4)  Nasonex 50 Mcg/act  Susp (Mometasone Furoate) .... 2 Sprays Each Nostril Once Daily 5)  Hydrochlorothiazide 25 Mg Tabs (Hydrochlorothiazide) .... 1/2 By Mouth Once Daily 6)  Benicar 20 Mg Tabs (Olmesartan Medoxomil) .Marland Kitchen.. 1 By Mouth Once Daily 7)  Lipitor 10 Mg Tabs (Atorvastatin Calcium) .Marland Kitchen.. 1 By Mouth At Bedtime 8)  Metformin Hcl 500 Mg Tabs (Metformin Hcl) .Marland Kitchen.. 1 By Mouth Bid 9)  Synthroid 137 Mcg Tabs (Levothyroxine Sodium) .... Take One Tablet Daily 10)  Fosamax 70 Mg Tabs (Alendronate Sodium) .Marland Kitchen.. 1 By Mouth Once Weekly. 11)  Allopurinol 300 Mg Tabs (Allopurinol) .Marland Kitchen.. 1 By Mouth Once Daily 12)  Celebrex 200 Mg Caps (Celecoxib) .Marland Kitchen.. 1 By Mouth Once Daily 13)  Ultram 50 Mg Tabs (Tramadol Hcl) .Marland Kitchen.. 1-2  By Mouth Every 6 Hrs As Needed. 14)  Biotin 10 Mg Tabs (Biotin) .... Once Daily 15)  Multivitamins  Tabs (Multiple Vitamin) .... Once Daily 16)  Clotrimazole-Betamethasone 1-0.05 % Crea (Clotrimazole-Betamethasone) .... Apply Bid 17)  Klonopin 0.5 Mg Tabs (Clonazepam) .Marland Kitchen.. 1 By Mouth Two Times A  Day 18)  Rozerem 8 Mg Tabs (Ramelteon) .Marland Kitchen.. 1 By Mouth At Bedtime  Allergies (verified): 1)  ! Codeine  Past History:  Past medical, surgical, family and social histories (including risk factors) reviewed for relevance to current acute and chronic problems.  Past Medical History: Reviewed history from 08/07/2009 and no changes required. Obsessive compulsive disorder GERD Hiatal hernia Candidal esophagitis Diverticulosis Colon polyps Microscopic colitis 2nd to NSAID use Hypertension Hyperlipidemia Diabetes mellitus, type II Hypothyroidism Chronic rhinitis Gout Osteoporosis Insomnia Encephalopathy 2009 with ?viral menigitis Ataxia Vertigo Shingles  Past Surgical History: Reviewed history from 08/07/2009 and no changes required. Carpal Tunnel-2005 sinusitis signs ---sinus surgery in 2000 Colonoscopy-1991 L3-L4 laminectomy-12/24/2005 Colon - 04/26/2006  Family History: Reviewed history from 12/31/2008 and no changes required. Family History Depression Family History Diabetes 1st degree relative Family History Other cancer-Breast, Colon, Kidney M--alz dementia--died 2008-- 72yo  Social History: Reviewed history from 07/14/2006 and no changes required. Never Smoked Alcohol use-no Drug use-no Regular exercise-yes  Review of Systems      See HPI  Physical Exam  General:  Well-developed,well-nourished,in no acute distress; alert,appropriate and cooperative throughout examination Lungs:  Normal respiratory effort, chest expands symmetrically. Lungs are clear to auscultation, no crackles or wheezes. Heart:  normal rate and no murmur.  Extremities:  No clubbing, cyanosis, edema, or deformity noted with normal full range of motion of all joints.   Psych:  Cognition and judgment appear intact. Alert and cooperative with normal attention span and concentration. No apparent delusions, illusions, hallucinations   Impression & Recommendations:  Problem # 1:   HYPOTENSION (ICD-458.9) Will decrease Bp med some more rto 2-3 weeks  Problem # 2:  INSOMNIA (ICD-780.52)  Her updated medication list for this problem includes:    Rozerem 8 Mg Tabs (Ramelteon) .Marland Kitchen... 1 by mouth at bedtime  Problem # 3:  ANXIETY (ICD-300.00)  Her updated medication list for this problem includes:    Fluvoxamine Maleate 100 Mg Tabs (Fluvoxamine maleate) .Marland Kitchen... 1 by mouth two times a day    Klonopin 0.5 Mg Tabs (Clonazepam) .Marland Kitchen... 1 by mouth two times a day  Discussed medication use and relaxation techniques.   Complete Medication List: 1)  Fluvoxamine Maleate 100 Mg Tabs (Fluvoxamine maleate) .Marland Kitchen.. 1 by mouth two times a day 2)  Nexium 40 Mg Cpdr (Esomeprazole magnesium) .... Take 1 by mouth once daily 3)  Allegra 180 Mg Tabs (Fexofenadine hcl) .Marland Kitchen.. 1 by mouth once daily 4)  Nasonex 50 Mcg/act Susp (Mometasone furoate) .... 2 sprays each nostril once daily 5)  Hydrochlorothiazide 25 Mg Tabs (Hydrochlorothiazide) .... 1/2 by mouth once daily 6)  Benicar 20 Mg Tabs (Olmesartan medoxomil) .Marland Kitchen.. 1 by mouth once daily 7)  Lipitor 10 Mg Tabs (Atorvastatin calcium) .Marland Kitchen.. 1 by mouth at bedtime 8)  Metformin Hcl 500 Mg Tabs (Metformin hcl) .Marland Kitchen.. 1 by mouth bid 9)  Synthroid 137 Mcg Tabs (Levothyroxine sodium) .... Take one tablet daily 10)  Fosamax 70 Mg Tabs (Alendronate sodium) .Marland Kitchen.. 1 by mouth once weekly. 11)  Allopurinol 300 Mg Tabs (Allopurinol) .Marland Kitchen.. 1 by mouth once daily 12)  Celebrex 200 Mg Caps (Celecoxib) .Marland Kitchen.. 1 by mouth once daily 13)  Ultram 50 Mg Tabs (Tramadol hcl) .Marland Kitchen.. 1-2  by mouth every 6 hrs as needed. 14)  Biotin 10 Mg Tabs (Biotin) .... Once daily 15)  Multivitamins Tabs (Multiple vitamin) .... Once daily 16)  Clotrimazole-betamethasone 1-0.05 % Crea (Clotrimazole-betamethasone) .... Apply bid 17)  Klonopin 0.5 Mg Tabs (Clonazepam) .Marland Kitchen.. 1 by mouth two times a day 18)  Rozerem 8 Mg Tabs (Ramelteon) .Marland Kitchen.. 1 by mouth at bedtime Prescriptions: ULTRAM 50 MG TABS  (TRAMADOL HCL) 1-2  by mouth every 6 hrs as needed.  #60 Tablet x 2   Entered and Authorized by:   Loreen Freud DO   Signed by:   Loreen Freud DO on 09/09/2009   Method used:   Reprint   RxID:   [id number] BENICAR 20 MG TABS (OLMESARTAN MEDOXOMIL) 1 by mouth once daily  #30 x 2   Entered and Authorized by:   Loreen Freud DO   Signed by:   Loreen Freud DO on 09/09/2009   Method used:   Electronically to        [id number]       [address]       [place], [place]  [address]       Ph: [phone] or [phone]       Fax: [phone]   RxID:   [id number] ROZEREM 8 MG TABS (RAMELTEON) 1 by mouth at bedtime  #30 x 5   Entered and Authorized by:   Loreen Freud DO   Signed by:   Loreen Freud DO on 09/09/2009   Method used:   Electronically to  [id number]       [address]       [place], [place]  [address]       Ph: [phone] or [phone]       Fax: [phone]   RxID:   [id number] BENICAR 20 MG TABS (OLMESARTAN MEDOXOMIL) 1 by mouth once daily  #30 x 0   Entered and Authorized by:   Loreen Freud DO   Signed by:   Loreen Freud DO on 09/09/2009   Method used:   Historical   RxID:   [id number]

## 2010-03-11 NOTE — Miscellaneous (Signed)
Summary: Sleep study report  Clinical Lists Changes AHI 14.2, oxygen nadir 87%.  Supine AHI 28.3, Non-supine AHI 0.8.  PLMI 150.8.  Will have my nurse call to schedule next available ROV to discuss.  Appended Document: Sleep study report LMOMTCB.  Appended Document: Sleep study report Follow-up with Dr. Craige Cotta on 10/02/2009. Patient is aware.

## 2010-03-11 NOTE — Letter (Signed)
Summary: CMN for CPAP Supplies/[facility]  CMN for CPAP Supplies/[facility]   Imported By: Sherian Rein 11/25/2009 09:35:20  _____________________________________________________________________  External Attachment:    Type:   Image     Comment:   External Document

## 2010-03-11 NOTE — Progress Notes (Signed)
Summary: -lab  Phone Note Outgoing Call   Details for Reason: All  labs are normal except for elevated sugar & the mildly elevated  TSH (thyroid) . This TSH level is safer than the very low TSH which could be contributing to elevated heart rate.Stay off the Synthroid & Allopurinol. Repeat uric acid & TSH in 8 weeks to allow optimal assessment of medication needs. Read all food & drink labels. Consume LESS THAN 40 grams of sugar/ day from those foods & drinks with High Fructose Corn Syrup as #1,2 or # 3 on label. This sucrose (cane sugar ) substitute exacerbates Diabetes & raises uric acid.   --PER DR LOWNE Pt needs to be seeing a psychiatrist--- please give pt and or her daughter if she is on the record to talk to [place] ---numbers for psych Summary of Call: left message to call office.............Marland KitchenFelecia Deloach CMA  September 23, 2009 4:15 PM  left message to call  office................Marland KitchenFelecia Deloach CMA  September 24, 2009 10:01 AM  left message to call OFFFICE........................Marland KitchenFelecia Deloach CMA  September 25, 2009 11:57 AM  left message to call  office.tried several times to get in touch with pt letter mailed with lab result and psy numbers..............Marland KitchenFelecia Deloach CMA  September 26, 2009 8:17 AM

## 2010-03-11 NOTE — Miscellaneous (Signed)
Summary: CPAP Set Up/[facility]  CPAP Set Up/[facility]   Imported By: Sherian Rein 11/14/2009 14:37:37  _____________________________________________________________________  External Attachment:    Type:   Image     Comment:   External Document

## 2010-03-11 NOTE — Progress Notes (Signed)
Summary: refills  Phone Note Refill Request Message from:  Fax from Pharmacy  Refills Requested: Medication #1:  AZOR 5-40 MG TABS 1 by mouth once daily   Last Refilled: 01/28/2009  Medication #2:  ULTRAM 50 MG TABS 1-2  by mouth every 6 hrs as needed.Charlsie Quest Name Necessary? No   Last Refilled: 02/08/2009  Medication #3:  SYNTHROID 200 MCG TABS 1 by mouth qd [BMN] [facility] [facility]---[phone]     fax---[phone]  Initial call taken by: Warnell Forester,  February 25, 2009 10:19 AM  Follow-up for Phone Call        cpx- 12/31/08 Army Fossa CMA  February 25, 2009 12:01 PM  Additional Follow-up for Phone Call Additional follow up Details #1::        pt was supposed to see endo for thyroid---she should be refilling that others are OK Additional Follow-up by: Loreen Freud DO,  February 25, 2009 12:57 PM    Additional Follow-up for Phone Call Additional follow up Details #2::    left message for pt to discuss synthroid. Army Fossa CMA  February 25, 2009 1:32 PM  Prescriptions: ULTRAM 50 MG TABS (TRAMADOL HCL) 1-2  by mouth every 6 hrs as needed.  #60 x 0   Entered by:   Army Fossa CMA   Authorized by:   Loreen Freud DO   Signed by:   Army Fossa CMA on 02/25/2009   Method used:   Electronically to        [id number]       [address]       [place], [place]  [address]       Ph: [phone] or [phone]       Fax: [phone]   RxID:   [id number] AZOR 5-40 MG TABS (AMLODIPINE-OLMESARTAN) 1 by mouth once daily  #30 Tablet x 2   Entered by:   Army Fossa CMA   Authorized by:   Loreen Freud DO   Signed by:   Army Fossa CMA on 02/25/2009   Method used:   Electronically to        [id number]       [address]       [place], [place]  [address]       Ph: [phone] or [phone]       Fax: [phone]   RxID:   [id number]   Appended Document: refills pt is aware- she will call dr.balans office.

## 2010-03-11 NOTE — Progress Notes (Signed)
Summary: Refill Requests  Phone Note Refill Request Call back at [phone] Message from:  Pharmacy on November 08, 2009 8:48 AM  Refills Requested: Medication #1:  KLONOPIN 0.5 MG TABS 1 by mouth two times a day   Dosage confirmed as above?Dosage Confirmed   Brand Name Necessary? No   Supply Requested: 1 month   Last Refilled: 08/19/2009   Notes: last done 7/11 x1  Medication #2:  FOSAMAX 70 MG TABS 1 by mouth once weekly.   Dosage confirmed as above?Dosage Confirmed   Brand Name Necessary? No   [facility]ed[facility] [address]  Next Appointment Scheduled: 1.5.11 Initial call taken by: Harold Barban,  November 08, 2009 8:49 AM  Follow-up for Phone Call        Please advise. Lucious Groves CMA  November 08, 2009 9:23 AM   Additional Follow-up for Phone Call Additional follow up Details #1::        looks like pt is due for bmd---  if done same place as mammogram we can schedule for february with mammogram and fill fosamax until february Additional Follow-up by: Loreen Freud DO,  November 08, 2009 10:36 AM    Prescriptions: KLONOPIN 0.5 MG TABS (CLONAZEPAM) 1 by mouth two times a day  #60 x 1   Entered by:   Doristine Devoid CMA   Authorized by:   Loreen Freud DO   Signed by:   Doristine Devoid CMA on 11/08/2009   Method used:   Printed then faxed to ...       [id number]       [address]       [place], [place]  [address]       Ph: [phone] or [phone]       Fax: [phone]   RxID:   [id number] FOSAMAX 70 MG TABS (ALENDRONATE SODIUM) 1 by mouth once weekly.  #4 Tablet x 6   Entered by:   Doristine Devoid CMA   Authorized by:   Loreen Freud DO   Signed by:   Doristine Devoid CMA on 11/08/2009   Method used:   Printed then faxed to ...       [facility] [address] (retail)       [address]       [place], [place]  [address]       Ph: [phone] or [phone]       Fax: [phone]   RxID:   [id number]

## 2010-03-11 NOTE — Assessment & Plan Note (Signed)
Summary: BP CHECK/KP   Vital Signs:  Patient profile:   70 year old female Weight:      219.8 pounds Temp:     99.0 degrees F oral Pulse rate:   100 / minute Pulse rhythm:   regular BP sitting:   130 / 82  (right arm) Cuff size:   regular  Vitals Entered By: Almeta Monas CMA Duncan Dull) (November 07, 2009 3:50 PM) CC: BP check   History of Present Illness: Pt here for bp check only.  Current Medications (verified): 1)  Nexium 40 Mg Cpdr (Esomeprazole Magnesium) .... Take 1 By Mouth Once Daily 2)  Allegra Allergy 180 Mg Tabs (Fexofenadine Hcl) .Marland Kitchen.. 1 By Mouth Once Daily 3)  Nasonex 50 Mcg/act  Susp (Mometasone Furoate) .... 2 Sprays Each Nostril Once Daily 4)  Benicar 20 Mg Tabs (Olmesartan Medoxomil) .Marland Kitchen.. 1 By Mouth Once Daily 5)  Metformin Hcl 500 Mg Tabs (Metformin Hcl) .Marland Kitchen.. 1 By Mouth Bid 6)  Synthroid 125 Mcg Tabs (Levothyroxine Sodium) .Marland Kitchen.. 1 By Mouth Once Daily 7)  Fosamax 70 Mg Tabs (Alendronate Sodium) .Marland Kitchen.. 1 By Mouth Once Weekly. 8)  Allopurinol 300 Mg Tabs (Allopurinol) .Marland Kitchen.. 1 By Mouth Once Daily 9)  Celebrex 200 Mg Caps (Celecoxib) .Marland Kitchen.. 1 By Mouth Once Daily 10)  Ultram 50 Mg Tabs (Tramadol Hcl) .Marland Kitchen.. 1-2  By Mouth Every 6 Hrs As Needed. 11)  Biotin 10 Mg Tabs (Biotin) .... Once Daily 12)  Multivitamins  Tabs (Multiple Vitamin) .... Once Daily 13)  Clotrimazole-Betamethasone 1-0.05 % Crea (Clotrimazole-Betamethasone) .... Apply Bid 14)  Klonopin 0.5 Mg Tabs (Clonazepam) .Marland Kitchen.. 1 By Mouth Two Times A Day 15)  Rozerem 8 Mg Tabs (Ramelteon) .Marland Kitchen.. 1 By Mouth At Bedtime 16)  Cipro 500 Mg Tabs (Ciprofloxacin Hcl) .Marland Kitchen.. 1 By Mouth Twice A Day For 7 Days 17)  Hydrochlorothiazide 25 Mg Tabs (Hydrochlorothiazide) .... By Mouth Once Daily 18)  Colcrys 0.6 Mg Tabs (Colchicine) .... As Needed 19)  Trazadone .Marland Kitchen.. 1 By Mouth At Bedtime 20)  Aricept 5 Mg Tabs (Donepezil Hcl) .Marland Kitchen.. 1 By Mouth At Bedtime 21)  Cholestyramine 4 Gm Pack (Cholestyramine) .Marland Kitchen.. 1 Packet Daily Mixed in Water 22)   Amlodipine Besylate 5 Mg Tabs (Amlodipine Besylate) .Marland Kitchen.. 1 By Mouth Once Daily 23)  Amlodipine Besylate 5 Mg Tabs (Amlodipine Besylate) .Marland Kitchen.. 1 By Mouth Once Daily  Allergies (verified): 1)  ! Codeine  Physical Exam  General:  Well-developed,well-nourished,in no acute distress; alert,appropriate and cooperative throughout examination Lungs:  Normal respiratory effort, chest expands symmetrically. Lungs are clear to auscultation, no crackles or wheezes. Heart:  Normal rate and regular rhythm. S1 and S2 normal without gallop, murmur, click, rub or other extra sounds. Psych:  Oriented X3 and normally interactive.     Impression & Recommendations:  Problem # 1:  HYPERTENSION (ICD-401.9)  The following medications were removed from the medication list:    Amlodipine Besylate 5 Mg Tabs (Amlodipine besylate) .Marland Kitchen... 1 by mouth once daily Her updated medication list for this problem includes:    Benicar 20 Mg Tabs (Olmesartan medoxomil) .Marland Kitchen... 1 by mouth once daily    Hydrochlorothiazide 25 Mg Tabs (Hydrochlorothiazide) ..... By mouth once daily    Amlodipine Besylate 5 Mg Tabs (Amlodipine besylate) .Marland Kitchen... 1 by mouth once daily  BP today: 130/82 Prior BP: 124/80 (11/07/2009)  Labs Reviewed: K+: 4.1 (09/20/2009) Creat: : 1.15 (09/20/2009)   Chol: 197 (07/04/2009)   HDL: 55.40 (07/04/2009)   LDL: 103 (07/04/2009)   TG: 191.0 (07/04/2009)  Complete Medication List: 1)  Nexium 40 Mg Cpdr (Esomeprazole magnesium) .... Take 1 by mouth once daily 2)  Allegra Allergy 180 Mg Tabs (Fexofenadine hcl) .Marland Kitchen.. 1 by mouth once daily 3)  Nasonex 50 Mcg/act Susp (Mometasone furoate) .... 2 sprays each nostril once daily 4)  Benicar 20 Mg Tabs (Olmesartan medoxomil) .Marland Kitchen.. 1 by mouth once daily 5)  Glucophage Xr 500 Mg Xr24h-tab (Metformin hcl) .Marland Kitchen.. 1 by mouth at bedtime 6)  Synthroid 125 Mcg Tabs (Levothyroxine sodium) .Marland Kitchen.. 1 by mouth once daily 7)  Fosamax 70 Mg Tabs (Alendronate sodium) .Marland Kitchen.. 1 by mouth once  weekly. 8)  Allopurinol 300 Mg Tabs (Allopurinol) .Marland Kitchen.. 1 by mouth once daily 9)  Celebrex 200 Mg Caps (Celecoxib) .Marland Kitchen.. 1 by mouth once daily 10)  Ultram 50 Mg Tabs (Tramadol hcl) .Marland Kitchen.. 1-2  by mouth every 6 hrs as needed. 11)  Biotin 10 Mg Tabs (Biotin) .... Once daily 12)  Multivitamins Tabs (Multiple vitamin) .... Once daily 13)  Clotrimazole-betamethasone 1-0.05 % Crea (Clotrimazole-betamethasone) .... Apply bid 14)  Klonopin 0.5 Mg Tabs (Clonazepam) .Marland Kitchen.. 1 by mouth two times a day 15)  Rozerem 8 Mg Tabs (Ramelteon) .Marland Kitchen.. 1 by mouth at bedtime 16)  Cipro 500 Mg Tabs (Ciprofloxacin hcl) .Marland Kitchen.. 1 by mouth twice a day for 7 days 17)  Hydrochlorothiazide 25 Mg Tabs (Hydrochlorothiazide) .... By mouth once daily 18)  Colcrys 0.6 Mg Tabs (Colchicine) .... As needed 19)  Trazadone  .Marland KitchenMarland Kitchen. 1 by mouth at bedtime 20)  Aricept 5 Mg Tabs (Donepezil hcl) .Marland Kitchen.. 1 by mouth at bedtime 21)  Cholestyramine 4 Gm Pack (Cholestyramine) .Marland Kitchen.. 1 packet daily mixed in water 22)  Amlodipine Besylate 5 Mg Tabs (Amlodipine besylate) .Marland Kitchen.. 1 by mouth once daily  Patient Instructions: 1)  con't with benicar and norvac---when you run out call us nd we can send rx for the combo pill 2)  Please schedule a follow-up appointment in 3 months .  Prescriptions: GLUCOPHAGE XR 500 MG XR24H-TAB (METFORMIN HCL) 1 by mouth at bedtime  #30 x 2   Entered and Authorized by:   Loreen Freud DO   Signed by:   Loreen Freud DO on 11/07/2009   Method used:   [facility]        [phone][facility] ([facility]ail)       [address]       [place], [place]  [address]       Ph: [phone] or [phone]       Fax: [phone]   RxID:   [id number]

## 2010-03-11 NOTE — Assessment & Plan Note (Signed)
Summary: bp check/cbs   Vital Signs:  Patient profile:   70 year old female Height:      67 inches Weight:      227 pounds BMI:     35.68 BP sitting:   126 / 76  (left arm) Cuff size:   regular  Vitals Entered By: Army Fossa CMA (Jul 02, 2009 10:58 AM) CC: Pt here to follow up on BP.   History of Present Illness:  Hypertension follow-up      This is a 70 year old woman who presents for Hypertension follow-up.  The patient denies lightheadedness, urinary frequency, headaches, edema, impotence, rash, and fatigue.  The patient denies the following associated symptoms: chest pain, chest pressure, exercise intolerance, dyspnea, palpitations, syncope, leg edema, and pedal edema.  Compliance with medications (by patient report) has been near 100%.  The patient reports that dietary compliance has been good.  Adjunctive measures currently used by the patient include salt restriction.    Hyperlipidemia follow-up      The patient also presents for Hyperlipidemia follow-up.  The patient denies muscle aches, GI upset, abdominal pain, flushing, itching, constipation, diarrhea, and fatigue.  The patient denies the following symptoms: chest pain/pressure, exercise intolerance, dypsnea, palpitations, syncope, and pedal edema.  Compliance with medications (by patient report) has been near 100%.  Dietary compliance has been good.  Adjunctive measures currently used by the patient include weight reduction.    Current Medications (verified): 1)  Fluvoxamine Maleate 100 Mg Tabs (Fluvoxamine Maleate) .Marland Kitchen.. 1 By Mouth Two Times A Day 2)  Nexium 40 Mg Cpdr (Esomeprazole Magnesium) .... Take 1 By Mouth Once Daily 3)  Allegra 180 Mg  Tabs (Fexofenadine Hcl) .Marland Kitchen.. 1 By Mouth Once Daily 4)  Nasonex 50 Mcg/act  Susp (Mometasone Furoate) .... 2 Sprays Each Nostril Once Daily 5)  Questran 4 Gm  Pack (Cholestyramine) .Marland Kitchen.. 1 Packet Mixed in Water Daily 6)  Hydrochlorothiazide 25 Mg Tabs (Hydrochlorothiazide) .... 1/2  By Mouth Once Daily 7)  Synthroid 200 Mcg Tabs (Levothyroxine Sodium) .Marland Kitchen.. 1 By Mouth Qd 8)  Multivitamins  Tabs (Multiple Vitamin) .... Once Daily 9)  Biotin 10 Mg Tabs (Biotin) .... Once Daily 10)  Allopurinol 300 Mg Tabs (Allopurinol) .Marland Kitchen.. 1 By Mouth Once Daily 11)  Celebrex 200 Mg Caps (Celecoxib) .Marland Kitchen.. 1 By Mouth Once Daily 12)  Fosamax 70 Mg Tabs (Alendronate Sodium) .Marland Kitchen.. 1 By Mouth Once Weekly. 13)  Zostavax 16109 Unt/0.32ml Solr (Zoster Vaccine Live) .Marland Kitchen.. 1 Ml Im X1 14)  Metformin Hcl 500 Mg Tabs (Metformin Hcl) .Marland Kitchen.. 1 By Mouth Bid 15)  Ultram 50 Mg Tabs (Tramadol Hcl) .Marland Kitchen.. 1-2  By Mouth Every 6 Hrs As Needed. 16)  Ambien 10 Mg Tabs (Zolpidem Tartrate) .Marland Kitchen.. 1 By Mouth At Bedtime 17)  Rozerem 8 Mg Tabs (Ramelteon) .Marland Kitchen.. 1 By Mouth At Bedtime Prn  Allergies: 1)  ! Codeine  Past History:  Past medical, surgical, family and social histories (including risk factors) reviewed for relevance to current acute and chronic problems.  Past Medical History: Reviewed history from 08/21/2008 and no changes required. 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 Carpal Tunnel-2005 sinusitis sx Colonoscopy-1991 Back sx-12/24/2005 Colon - 04/26/2006  Family History: Reviewed history from 12/31/2008 and no changes required. Family History Depression Family History Diabetes 1st degree relative Family History Other cancer-Breast, Colon, Kidney M--alz dementia--died 2008-- 72yo  Social History: Reviewed history from 07/14/2006 and no changes required. Never Smoked Alcohol use-no Drug use-no Regular exercise-yes  Review of Systems      See HPI  Physical Exam  General:  Well-developed,well-nourished,in no acute distress; alert,appropriate and cooperative throughout examination Lungs:  Normal respiratory effort, chest expands symmetrically. Lungs are clear to auscultation, no crackles or wheezes. Heart:  normal rate and no murmur.   Extremities:  No clubbing, cyanosis, edema, or deformity noted with normal full range of motion of all joints.   Skin:  Intact without suspicious lesions or rashes Psych:  Cognition and judgment appear intact. Alert and cooperative with normal attention span and concentration. No apparent delusions, illusions, hallucinations   Impression & Recommendations:  Problem # 1:  DIABETES MELLITUS, TYPE II (ICD-250.00)  The following medications were removed from the medication list:    Azor 5-40 Mg Tabs (Amlodipine-olmesartan) .Marland Kitchen... 1 by mouth once daily Her updated medication list for this problem includes:    Metformin Hcl 500 Mg Tabs (Metformin hcl) .Marland Kitchen... 1 by mouth bid  Labs Reviewed: Creat: 0.9 (12/31/2008)    Reviewed HgBA1c results: 5.9 (12/31/2008)  6.0 (08/26/2007)  Problem # 2:  HYPERLIPIDEMIA (ICD-272.4)  Her updated medication list for this problem includes:    Questran 4 Gm Pack (Cholestyramine) .Marland Kitchen... 1 packet mixed in water  daily  Labs Reviewed: SGOT: 37 (12/31/2008)   SGPT: 27 (12/31/2008)   HDL:53.60 (12/31/2008)  Chol:253 (12/31/2008)  Trig:270.0 (12/31/2008)  Problem # 3:  HYPOTHYROIDISM (ICD-244.9) per endo Her updated medication list for this problem includes:    Synthroid 200 Mcg Tabs (Levothyroxine sodium) .Marland Kitchen... 1 by mouth qd  Problem # 4:  HYPERTENSION (ICD-401.9)  The following medications were removed from the medication list:    Azor 5-40 Mg Tabs (Amlodipine-olmesartan) .Marland Kitchen... 1 by mouth once daily Her updated medication list for this problem includes:    Hydrochlorothiazide 25 Mg Tabs (Hydrochlorothiazide) .Marland Kitchen... 1/2 by mouth once daily  BP today: 126/76 Prior BP: 134/84 (12/31/2008)  Labs Reviewed: K+: 3.9 (12/31/2008) Creat: : 0.9 (12/31/2008)   Chol: 253 (12/31/2008)   HDL: 53.60 (12/31/2008)   TG: 270.0 (12/31/2008)  Problem # 5:  INSOMNIA-SLEEP DISORDER-UNSPEC (ICD-780.52) use ambien for no more than 2 weeks---take rozeram nightly rto if no better Her updated medication list for this problem includes:    Ambien 10 Mg Tabs (Zolpidem tartrate) .Marland Kitchen... 1 by mouth at bedtime    Rozerem 8 Mg Tabs (Ramelteon) .Marland Kitchen... 1 by mouth at bedtime prn  Discussed sleep hygiene.   Complete Medication List: 1)  Fluvoxamine Maleate 100 Mg Tabs (Fluvoxamine maleate) .Marland Kitchen.. 1 by mouth two times a day 2)  Nexium 40 Mg Cpdr (Esomeprazole magnesium) .... Take 1 by mouth once daily 3)  Allegra 180 Mg Tabs (Fexofenadine hcl) .Marland Kitchen.. 1 by mouth once daily 4)  Nasonex 50 Mcg/act Susp (Mometasone furoate) .... 2 sprays each nostril once daily 5)  Questran 4 Gm Pack (Cholestyramine) .Marland Kitchen.. 1 packet mixed in water daily 6)  Hydrochlorothiazide 25 Mg Tabs (Hydrochlorothiazide) .... 1/2 by mouth once daily 7)  Synthroid 200 Mcg Tabs (Levothyroxine sodium) .Marland Kitchen.. 1 by mouth qd 8)  Multivitamins Tabs (Multiple vitamin) .... Once daily 9)  Biotin 10 Mg Tabs (Biotin) .... Once daily 10)  Allopurinol 300 Mg Tabs (Allopurinol)  .Marland Kitchen.. 1 by mouth once daily 11)  Celebrex 200 Mg Caps (Celecoxib) .Marland Kitchen.. 1 by mouth once daily 12)  Fosamax 70 Mg Tabs (Alendronate sodium) .Marland Kitchen.. 1 by mouth once weekly. 13)  Zostavax 21308 Unt/0.28ml Solr (Zoster vaccine live) .Marland Kitchen.. 1 ml im x1 14)  Metformin Hcl 500 Mg Tabs (Metformin hcl) .Marland Kitchen.. 1 by mouth bid 15)  Ultram 50 Mg Tabs (Tramadol hcl) .Marland Kitchen.. 1-2  by mouth every 6 hrs as needed. 16)  Ambien 10 Mg Tabs (Zolpidem tartrate) .Marland Kitchen.. 1 by mouth at bedtime 17)  Rozerem 8 Mg Tabs (Ramelteon) .Marland Kitchen.. 1 by mouth at bedtime prn  Patient Instructions: 1)  fasting labs --bmp, lipid, hep 272.4  401.9  250.00 hgba1c 2)  Please schedule a follow-up appointment in 2 weeks.  Prescriptions: ROZEREM 8 MG TABS (RAMELTEON) 1 by mouth at bedtime prn  #30 x 5   Entered and Authorized by:   Loreen Freud DO   Signed by:   Loreen Freud DO on 07/02/2009   Method used:   Print then Give to Patient   RxID:   [id number] AMBIEN 10 MG TABS (ZOLPIDEM TARTRATE) 1 by mouth at bedtime  #15 x 0   Entered and Authorized by:   Loreen Freud DO   Signed by:   Loreen Freud DO on 07/02/2009   Method used:   Print then Give to Patient   RxID:   [id number]

## 2010-03-11 NOTE — Progress Notes (Signed)
Summary: Elevated Bp  Phone Note Other Incoming   Caller: Tonya from [facility] Details for Reason: Elevated BP Summary of Call: Call from [facility] nurse with [facility] advising that pt has a sitting bp of 160/110 and standing of 150/110 and wantd to know what we recommend. Per Dr.Lowne call in Amlodipine .5mg  to take with the Bp meds and f/u in 1 week. I called and advised Daughter, appt scheduled for 9/29 @ 4pm with Dr.Lowne Per Daughter [facility] [address].    Initial call taken by: Almeta Monas CMA Duncan Dull),  November 01, 2009 4:43 PM    New/Updated Medications: AMLODIPINE BESYLATE 5 MG TABS (AMLODIPINE BESYLATE) 1 by mouth once daily AMLODIPINE BESYLATE 5 MG TABS (AMLODIPINE BESYLATE) 1 by mouth once daily Prescriptions: AMLODIPINE BESYLATE 5 MG TABS (AMLODIPINE BESYLATE) 1 by mouth once daily  #30 x 0   Entered by:   Almeta Monas CMA (AAMA)   Authorized by:   Loreen Freud DO   Signed by:   Almeta Monas CMA (AAMA) on 11/01/2009   Method used:   [facility]ed to ...       [facility] [address] (retail)       [address]       [place], [place]  [address]       Ph: [phone] or [phone]       Fax: [phone]   RxID:   [id number]
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
## 2010-03-11 NOTE — Letter (Signed)
Summary: [facility]: Immunoassay Fecal Occult Blood (iFOB) Order [facility]  [address] [place], [place] [address]   Phone: [phone]  Fax: [phone]      [facility]: Immunoassay Fecal Occult Blood (iFOB) Order Form   December 17, 2009 MRN: [id number]   [name] May 18, 1940   Physicican Name: Dr. Sheryn Bison  Diagnosis Code: 558.9      Harlow Mares CMA (AAMA)

## 2010-03-11 NOTE — Progress Notes (Signed)
Summary: nos appt  Phone Note Call from Patient   Caller: [email or web address]  Call For: sood Summary of Call: Rsc nos from 8/31 to 9/29 @ 1:30p. Initial call taken by: Darletta Moll,  October 10, 2009 2:08 PM

## 2010-03-11 NOTE — Progress Notes (Signed)
Summary: med refill complication  Phone Note Call from Patient Call back at Home Phone [phone]   Caller: Patient Call For: Dr. Jarold Motto Reason for Call: Refill Medication Summary of Call: [facility] [address] [facility] would need a colonoscopy before they could refill Cholestyramine rx... pt confused Initial call taken by: Vallarie Mare,  November 12, 2009 10:24 AM  Follow-up for Phone Call        rx sent and patient will need a office visit scheduled for 12/17/2009 Follow-up by: Harlow Mares CMA (AAMA),  November 12, 2009 10:40 AM    New/Updated Medications: CHOLESTYRAMINE 4 GM PACK (CHOLESTYRAMINE) mix one packet in water and drink daily. Prescriptions: CHOLESTYRAMINE 4 GM PACK (CHOLESTYRAMINE) mix one packet in water and drink daily.  #30 x 1   Entered by:   Harlow Mares CMA (AAMA)   Authorized by:   Mardella Layman MD [facility]   Signed by:   Harlow Mares CMA (AAMA) on 11/12/2009   Method used:   [facility] to        [facility] [phone][facility] (retail)       [address]       [place], [place]  [address]       Ph: [phone] or [phone]       Fax: [phone]   RxID:   [id number]

## 2010-03-11 NOTE — Medication Information (Signed)
Summary: [facility] Ro[facility]rem [facility]ox[facility]  [facility]rem [facility]   Imported By: Lanelle Bal 07/17/2009 11:04:48  _____________________________________________________________________  External Attachment:    Type:   Image     Comment:   External Document

## 2010-03-11 NOTE — Assessment & Plan Note (Signed)
Summary: 3 WKS OV//PH//rescd cbs   Vital Signs:  Patient profile:   70 year old female Weight:      221 pounds Temp:     98.5 degrees F oral BP sitting:   120 / 80  (right arm)  Vitals Entered By: Almeta Monas CMA Duncan Dull) (October 03, 2009 11:30 AM) CC: 2 wk f/u pt feels better now that meds have been changed   History of Present Illness: Pt here f/u last visit.  Pt feeling better since being back on all meds.  Pt saw Dr Talmage Nap to adjust synthroid.    Current Medications (verified): 1)  Fluvoxamine Maleate 100 Mg Tabs (Fluvoxamine Maleate) .Marland Kitchen.. 1 By Mouth Two Times A Day 2)  Nexium 40 Mg Cpdr (Esomeprazole Magnesium) .... Take 1 By Mouth Once Daily 3)  Allegra 180 Mg  Tabs (Fexofenadine Hcl) .Marland Kitchen.. 1 By Mouth Once Daily 4)  Nasonex 50 Mcg/act  Susp (Mometasone Furoate) .... 2 Sprays Each Nostril Once Daily 5)  Hydrochlorothiazide 25 Mg Tabs (Hydrochlorothiazide) .... 1/2 By Mouth Once Daily 6)  Benicar 20 Mg Tabs (Olmesartan Medoxomil) .Marland Kitchen.. 1 By Mouth Once Daily 7)  Metformin Hcl 500 Mg Tabs (Metformin Hcl) .Marland Kitchen.. 1 By Mouth Bid 8)  Synthroid 125 Mcg Tabs (Levothyroxine Sodium) .Marland Kitchen.. 1 By Mouth Once Daily 9)  Fosamax 70 Mg Tabs (Alendronate Sodium) .Marland Kitchen.. 1 By Mouth Once Weekly. 10)  Allopurinol 300 Mg Tabs (Allopurinol) .Marland Kitchen.. 1 By Mouth Once Daily 11)  Celebrex 200 Mg Caps (Celecoxib) .Marland Kitchen.. 1 By Mouth Once Daily 12)  Ultram 50 Mg Tabs (Tramadol Hcl) .Marland Kitchen.. 1-2  By Mouth Every 6 Hrs As Needed. 13)  Biotin 10 Mg Tabs (Biotin) .... Once Daily 14)  Multivitamins  Tabs (Multiple Vitamin) .... Once Daily 15)  Clotrimazole-Betamethasone 1-0.05 % Crea (Clotrimazole-Betamethasone) .... Apply Bid 16)  Klonopin 0.5 Mg Tabs (Clonazepam) .Marland Kitchen.. 1 By Mouth Two Times A Day 17)  Rozerem 8 Mg Tabs (Ramelteon) .Marland Kitchen.. 1 By Mouth At Bedtime  Allergies (verified): 1)  ! Codeine  Past History:  Past medical, surgical, family and social histories (including risk factors) reviewed for relevance to current acute  and chronic problems.  Past Medical History: Reviewed history from 08/07/2009 and no changes required. Obsessive compulsive disorder GERD Hiatal hernia Candidal esophagitis Diverticulosis Colon polyps Microscopic colitis 2nd to NSAID use Hypertension Hyperlipidemia Diabetes mellitus, type II Hypothyroidism Chronic rhinitis Gout Osteoporosis Insomnia Encephalopathy 2009 with ?viral menigitis Ataxia Vertigo Shingles  Past Surgical History: Reviewed history from 08/07/2009 and no changes required. Carpal Tunnel-2005 sinusitis signs ---sinus surgery in 2000 Colonoscopy-1991 L3-L4 laminectomy-12/24/2005 Colon - 04/26/2006  Family History: Reviewed history from 12/31/2008 and no changes required. Family History Depression Family History Diabetes 1st degree relative Family History Other cancer-Breast, Colon, Kidney M--alz dementia--died 2008-- 72yo  Social History: Reviewed history from 07/14/2006 and no changes required. Never Smoked Alcohol use-no Drug use-no Regular exercise-yes  Review of Systems      See HPI  Physical Exam  General:  Well-developed,well-nourished,in no acute distress; alert,appropriate and cooperative throughout examination Skin:  + large ulcerated mole vs skin tag Psych:  Oriented X3, normally interactive, good eye contact, not anxious appearing, and not depressed appearing.     Impression & Recommendations:  Problem # 1:  ALTERED MENTAL STATUS (ICD-780.97) Assessment Improved no further episodes  Problem # 2:  ANXIETY STATE, UNSPECIFIED (ICD-300.00) Assessment: Improved [facility]--she will make appointment Her updated medication list for this problem includes:    Fluvoxamine Maleate 100  Mg Tabs (Fluvoxamine maleate) .Marland Kitchen... 1 by mouth two times a day    Klonopin 0.5 Mg Tabs (Clonazepam) .Marland Kitchen... 1 by mouth two times a day  Problem # 3:  NEVUS, ATYPICAL (ICD-216.9)  Orders: Dermatology Referral (Derma)  Complete  Medication List: 1)  Fluvoxamine Maleate 100 Mg Tabs (Fluvoxamine maleate) .Marland Kitchen.. 1 by mouth two times a day 2)  Nexium 40 Mg Cpdr (Esomeprazole magnesium) .... Take 1 by mouth once daily 3)  Allegra 180 Mg Tabs (Fexofenadine hcl) .Marland Kitchen.. 1 by mouth once daily 4)  Nasonex 50 Mcg/act Susp (Mometasone furoate) .... 2 sprays each nostril once daily 5)  Hydrochlorothiazide 25 Mg Tabs (Hydrochlorothiazide) .... 1/2 by mouth once daily 6)  Benicar 20 Mg Tabs (Olmesartan medoxomil) .Marland Kitchen.. 1 by mouth once daily 7)  Metformin Hcl 500 Mg Tabs (Metformin hcl) .Marland Kitchen.. 1 by mouth bid 8)  Synthroid 125 Mcg Tabs (Levothyroxine sodium) .Marland Kitchen.. 1 by mouth once daily 9)  Fosamax 70 Mg Tabs (Alendronate sodium) .Marland Kitchen.. 1 by mouth once weekly. 10)  Allopurinol 300 Mg Tabs (Allopurinol) .Marland Kitchen.. 1 by mouth once daily 11)  Celebrex 200 Mg Caps (Celecoxib) .Marland Kitchen.. 1 by mouth once daily 12)  Ultram 50 Mg Tabs (Tramadol hcl) .Marland Kitchen.. 1-2  by mouth every 6 hrs as needed. 13)  Biotin 10 Mg Tabs (Biotin) .... Once daily 14)  Multivitamins Tabs (Multiple vitamin) .... Once daily 15)  Clotrimazole-betamethasone 1-0.05 % Crea (Clotrimazole-betamethasone) .... Apply bid 16)  Klonopin 0.5 Mg Tabs (Clonazepam) .Marland Kitchen.. 1 by mouth two times a day 17)  Rozerem 8 Mg Tabs (Ramelteon) .Marland Kitchen.. 1 by mouth at bedtime

## 2010-03-11 NOTE — Progress Notes (Signed)
Summary: Refill Request  Phone Note Refill Request Call back at [phone] Message from:  Pharmacy on September 09, 2009 4:22 PM  Refills Requested: Medication #1:  BENICAR 20 MG TABS 1 by mouth once daily   Dosage confirmed as above?Dosage Confirmed   Supply Requested: 1 month [facility] [address]  Next Appointment Scheduled: Aug.22nd.2011 Initial call taken by: Lavell Islam,  September 09, 2009 4:35 PM  Follow-up for Phone Call        sent electronically today by MD at office visit. Lucious Groves CMA  September 09, 2009 4:58 PM

## 2010-03-11 NOTE — Progress Notes (Signed)
Summary: lmtc 7-20-lab results  Phone Note Outgoing Call   Call placed by: [facility] CMA,  August 28, 2009 10:36 AM Details for Reason: hyperthyroid----decrease synthroid to 137 micrograms #30   1 by mouth once daily ,  2 refills. recheck 2 months  glucose elevated----recheck fasting with hgba1c  790.6 Summary of Call: left message to call office.........Marland KitchenFelecia Deloach CMA  August 28, 2009 10:37 AM   Follow-up for Phone Call        call patient on cell [phone] .Marland KitchenOkey Regal Spring  August 28, 2009 4:35 PM  spoke w/ patient aware of labs and that dose in synthroid needs to be changed.....Marland KitchenMarland KitchenDoristine Devoid CMA  August 28, 2009 5:07 PM     New/Updated Medications: SYNTHROID 137 MCG TABS (LEVOTHYROXINE SODIUM) take one tablet daily Prescriptions: SYNTHROID 137 MCG TABS (LEVOTHYROXINE SODIUM) take one tablet daily  #30 x 2   Entered by:   Doristine Devoid CMA   Authorized by:   Loreen Freud DO   Signed by:   Doristine Devoid CMA on 08/28/2009   Method used:   Electronically to        [id number] (retail)       [address]       [place], [place]  [address]       Ph: [phone] or [phone]       Fax: [phone]   RxID:   [id number]

## 2010-03-11 NOTE — Progress Notes (Signed)
Summary: pt confused   Phone Note Call from Patient   Caller: Daughter Summary of Call: patient daughter wanted to know if ultram could be causing patient to be "loopie" she said mom isnt acting right - the patient told her she was taking 2 in am - 2 in pm SHE HAS 22 PILLS LEFT of ultram- she  said patient is confused - shuffling her feet - patient daught concerned  - this happened once before & her brain swelled  Initial call taken by: Okey Regal Spring,  September 20, 2009 1:54 PM  Follow-up for Phone Call        PT SEE DR HOPPER TODAY.Marland KitchenMarland KitchenFelecia Deloach CMA  September 20, 2009 2:01 PM

## 2010-03-11 NOTE — Medication Information (Signed)
Summary: Approval for Benicar/[employer]  Approval for Benicar/[employer]   Imported By: Lanelle Bal 10/04/2009 09:14:03  _____________________________________________________________________  External Attachment:    Type:   Image     Comment:   External Document

## 2010-03-11 NOTE — Letter (Signed)
Summary: Generic Letter  [facility]  [address] [place], [place] [address]   Phone: [phone]  Fax: [phone]    11/07/2009  Re: YOSSELIN ZOELLER DOB 06/03/1940 [address] [place], [place]  [address]  To Whom It May Concern:  Please allow Hildred Laser to work out in your facility.             Sincerely,   Loreen Freud DO

## 2010-03-11 NOTE — Assessment & Plan Note (Signed)
Summary: sleep apnea/apc   Copy to:    Primary Provider/Referring Provider:  Loreen Freud, DO  CC:  Sleep consult. Epworth score is 1..  History of Present Illness: 70 yo female for sleep evaluation.  She has noticed a problem for the past several months.  She has troubl falling asleep and staying asleep.  She feels sleepy all the time during the day.  She goes to bed at 11pm, but it takes hours to fall asleep.  She will read or watch TV.  She will eventually fall asleep by 3am.  She wakes up 2 or 3 times to use the bathroom, but usually is able to fall back to sleep after.  She gets out of bed at 9am.  She feels okay at first, but gets tired as the day goes on.  She does get occasional headaches in the morning.  She will laydown for about 40 minutes once or twice per week during the day.  She does not think that she actually falls asleep during these.    She has tried all kinds of OTC sleep aides.  She has also tried [place] and rozerem.  None of these have helped.  She does not use anything to help her stay awake during the day.  She does snore, and will wake up with a coughing sensation.    She gets funny feelings in her legs several times per week before going to sleep.  This causes trouble falling asleep and staying asleep.  These feelings are worse with rest, and improve when she moves her legs.  She was told before that she had a low iron level.  She has a history of depression, but feels that her mood is okay.  She has a history of hypothyroidism, and her medications are being adjusted.  She has lost about 20 lbs, but has not noticed any difference with her sleep with the weight loss.  She was treated for a concussion after a fall several weeks ago.  There is no history of seizure disorder.  She was treated encephalopathy in 2009, possibly from a viral infection.  She has chronic vertigo.  She denies sleep walking, sleep talking, nightmares, or bruxism.  There is no history of sleep  hallucinations, sleep paralysis, or cataplexy.  Preventive Screening-Counseling & Management  Alcohol-Tobacco     Alcohol drinks/day: 0     Smoking Status: never  Current Medications (verified): 1)  Fluvoxamine Maleate 100 Mg Tabs (Fluvoxamine Maleate) .Marland Kitchen.. 1 By Mouth Two Times A Day 2)  Nexium 40 Mg Cpdr (Esomeprazole Magnesium) .... Take 1 By Mouth Once Daily 3)  Allegra 180 Mg  Tabs (Fexofenadine Hcl) .Marland Kitchen.. 1 By Mouth Once Daily 4)  Nasonex 50 Mcg/act  Susp (Mometasone Furoate) .... 2 Sprays Each Nostril Once Daily 5)  Hydrochlorothiazide 25 Mg Tabs (Hydrochlorothiazide) .... 1/2 By Mouth Once Daily 6)  Synthroid 175 Mcg Tabs (Levothyroxine Sodium) .Marland Kitchen.. 1 By Mouth Once Daily 7)  Multivitamins  Tabs (Multiple Vitamin) .... Once Daily 8)  Biotin 10 Mg Tabs (Biotin) .... Once Daily 9)  Allopurinol 300 Mg Tabs (Allopurinol) .Marland Kitchen.. 1 By Mouth Once Daily 10)  Celebrex 200 Mg Caps (Celecoxib) .Marland Kitchen.. 1 By Mouth Once Daily 11)  Fosamax 70 Mg Tabs (Alendronate Sodium) .Marland Kitchen.. 1 By Mouth Once Weekly. 12)  Metformin Hcl 500 Mg Tabs (Metformin Hcl) .Marland Kitchen.. 1 By Mouth Bid 13)  Ultram 50 Mg Tabs (Tramadol Hcl) .Marland Kitchen.. 1-2  By Mouth Every 6 Hrs As Needed. 14)  Lipitor  10 Mg Tabs (Atorvastatin Calcium) .Marland Kitchen.. 1 By Mouth At Bedtime 15)  Clotrimazole-Betamethasone 1-0.05 % Crea (Clotrimazole-Betamethasone) .... Apply Bid 16)  Azor 5-40 Mg Tabs (Amlodipine-Olmesartan) .... Take 1 By Mouth Daily.  Allergies (verified): 1)  ! Codeine  Past History:  Past Medical History: Obsessive compulsive disorder GERD Hiatal hernia Candidal esophagitis Diverticulosis Colon polyps Microscopic colitis 2nd to NSAID use Hypertension Hyperlipidemia Diabetes mellitus, type II Hypothyroidism Chronic rhinitis Gout Osteoporosis Insomnia Encephalopathy 2009 with ?viral menigitis Ataxia Vertigo Shingles  Past Surgical History: Carpal Tunnel-2005 sinusitis signs ---sinus surgery in 2000 Colonoscopy-1991 L3-L4  laminectomy-12/24/2005 Colon - 04/26/2006  Family History: Reviewed history from 12/31/2008 and no changes required. Family History Depression Family History Diabetes 1st degree relative Family History Other cancer-Breast, Colon, Kidney M--alz dementia--died 2008-- 72yo  Social History: Reviewed history from 07/14/2006 and no changes required. Never Smoked Alcohol use-no Drug use-no Regular exercise-yes  Review of Systems       The patient complains of non-productive cough, chest pain, acid heartburn, weight change, tooth/dental problems, headaches, nasal congestion/difficulty breathing through nose, depression, and hand/feet swelling.  The patient denies shortness of breath with activity, shortness of breath at rest, productive cough, coughing up blood, irregular heartbeats, indigestion, loss of appetite, abdominal pain, difficulty swallowing, sore throat, sneezing, itching, ear ache, anxiety, joint stiffness or pain, rash, change in color of mucus, and fever.    Vital Signs:  Patient profile:   70 year old female Height:      67 inches (170.18 cm) Weight:      213 pounds (96.82 kg) BMI:     33.48 O2 Sat:      95 % on Room air Temp:     98.0 degrees F (36.67 degrees C) oral Pulse rate:   122 / minute BP sitting:   100 / 54  (right arm) Cuff size:   regular  Vitals Entered By: Michel Bickers CMA (August 07, 2009 2:30 PM)  O2 Sat at Rest %:  95 O2 Flow:  Room air CC: Sleep consult. Epworth score is 1. Comments Medications reviewed. Daytime phone verified. Michel Bickers [facility]  August 07, 2009 2:33 PM   Physical Exam  General:  obese.   Eyes:  PERRLA and EOMI.   Ears:  TMs intact and clear with normal canals Nose:  no deformity, discharge, inflammation, or lesions Mouth:  Oral mucosa and oropharynx without lesions or exudates.  Teeth in good repair. Neck:  No deformities, masses, or tenderness noted.no carotid bruits.   Chest Wall:  No deformities, masses, or tenderness  noted. Lungs:  Normal respiratory effort, chest expands symmetrically. Lungs are clear to auscultation, no crackles or wheezes. Heart:  normal rate and no murmur.   Abdomen:  Bowel sounds positive,abdomen soft and non-tender without masses, organomegaly or hernias noted. Msk:  No deformity or scoliosis noted of thoracic or lumbar spine.   Pulses:  pulses normal Extremities:  No clubbing, cyanosis, edema, or deformity noted with normal full range of motion of all joints.   Neurologic:  alert & oriented X3, cranial nerves II-XII intact, and strength normal in all extremities.   Cervical Nodes:  No lymphadenopathy noted Psych:  anxious.     Impression & Recommendations:  Problem # 1:  HYPERSOMNIA (ICD-780.54) She has symptoms as well as physical findings suggestive of sleep apnea.  She does also have hypertension and diabetes.  To further evaluate this I have scheduled her for a sleep test.  I have explained how sleep apnea can affect  her health.  The importance of weight loss was discussed.  Problem # 2:  RESTLESS LEG SYNDROME (ICD-333.94) She has symptoms suggestive of restless leg syndrome.  I will check her lab work to see if she needs iron supplementation.  If her iron levels are normal would then defer further treatment until after her sleep test is reviewed.  Problem # 3:  CIRCADIAN RHYTHM SLEEP D/O DELAY SLEEP PHSE TYPE (ICD-327.31) She has delayed sleep phase.  Explained to her the importance of proper sleep hygiene, and keeping a regular sleep/wake pattern.  Will address sleep cycle entrainment further after review of her sleep test.  Problem # 4:  INSOMNIA (ICD-780.52) Reviewed several techiques for CBT.  Some of her sleep difficulties may be related to OSA, RLS, and delayed sleep phase.  Complete Medication List: 1)  Fluvoxamine Maleate 100 Mg Tabs (Fluvoxamine maleate) .Marland Kitchen.. 1 by mouth two times a day 2)  Nexium 40 Mg Cpdr (Esomeprazole magnesium) .... Take 1 by mouth once  daily 3)  Allegra 180 Mg Tabs (Fexofenadine hcl) .Marland Kitchen.. 1 by mouth once daily 4)  Nasonex 50 Mcg/act Susp (Mometasone furoate) .... 2 sprays each nostril once daily 5)  Hydrochlorothiazide 25 Mg Tabs (Hydrochlorothiazide) .... 1/2 by mouth once daily 6)  Azor 5-40 Mg Tabs (Amlodipine-olmesartan) .... Take 1 by mouth daily. 7)  Lipitor 10 Mg Tabs (Atorvastatin calcium) .Marland Kitchen.. 1 by mouth at bedtime 8)  Metformin Hcl 500 Mg Tabs (Metformin hcl) .Marland Kitchen.. 1 by mouth bid 9)  Synthroid 175 Mcg Tabs (Levothyroxine sodium) .Marland Kitchen.. 1 by mouth once daily 10)  Fosamax 70 Mg Tabs (Alendronate sodium) .Marland Kitchen.. 1 by mouth once weekly. 11)  Allopurinol 300 Mg Tabs (Allopurinol) .Marland Kitchen.. 1 by mouth once daily 12)  Celebrex 200 Mg Caps (Celecoxib) .Marland Kitchen.. 1 by mouth once daily 13)  Ultram 50 Mg Tabs (Tramadol hcl) .Marland Kitchen.. 1-2  by mouth every 6 hrs as needed. 14)  Biotin 10 Mg Tabs (Biotin) .... Once daily 15)  Multivitamins Tabs (Multiple vitamin) .... Once daily 16)  Clotrimazole-betamethasone 1-0.05 % Crea (Clotrimazole-betamethasone) .... Apply bid  Other Orders: Consultation Level IV ([address]) Sleep Disorder Referral (Sleep Disorder) TLB-BMP (Basic Metabolic Panel-BMET) (80048-METABOL) TLB-CBC Platelet - w/Differential (85025-CBCD) TLB-Hepatic/Liver Function Pnl (80076-HEPATIC) TLB-TSH (Thyroid Stimulating Hormone) (84443-TSH) TLB-B12 + Folate Pnl (60454_09811-B14/NWG) TLB-IBC Pnl (Iron/FE;Transferrin) (83550-IBC) TLB-Ferritin (82728-FER)  Patient Instructions: 1)  Lab test today 2)  Will schedule sleep test 3)  Will call to schedule follow up after sleep test is done
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
## 2010-03-11 NOTE — Medication Information (Signed)
Summary: Letter Regarding Fluvoxamine Maleate Dosage/[employer]  Letter Regarding Fluvoxamine Maleate Dosage/[employer]   Imported By: Lanelle Bal 02/21/2009 08:31:34  _____________________________________________________________________  External Attachment:    Type:   Image     Comment:   External Document

## 2010-03-11 NOTE — Letter (Signed)
Summary: [facility]  [facility]   Imported By: Lanelle Bal 05/30/2009 11:26:08  _____________________________________________________________________  External Attachment:    Type:   Image     Comment:   External Document

## 2010-03-11 NOTE — Progress Notes (Signed)
Summary: information from neurology and psych  Phone Note Call from Patient Call back at [phone]   Summary of Call: Saw neurologist yesterday cognitive testing was not good had be told what day it was when filling out each form she also couldn't count back had trouble answering basic questions. She was so bad yesterday she couldn't remember how to work remote at home. Today she is going on as if she was 8 yrs. 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 Laury Axon could mention having testing done. Also saw psych today and they said that her memory was excellent for someone her age. So daughter is torned about having her placed somewhere to be assisted with care b/c it seems as if there are days where she is very dependent on them and other days she is back to "regular self" what if any suggestions Dr. Laury Axon may have. Initial call taken by: Doristine Devoid CMA,  October 31, 2009 2:25 PM  Follow-up for Phone Call        Neurologists had all the tests we did and did not feel it was that---she should call the neurologist if she has ? about that.  I got the ov note already from neuro and that are planning a full work up. The problem with dementia is that they have good days and bad days ---assisted living is still a better idea that her being by herself.  She needs to be in a safe environment so she is protected on the "bad " days.   Follow-up by: Loreen Freud DO,  October 31, 2009 2:45 PM  Additional Follow-up for Phone Call Additional follow up Details #1::        Pt said she will  call her brother and  make a decision with her brother and then call for a referral for assisted living if needed. Additional Follow-up by: Almeta Monas CMA Duncan Dull),  October 31, 2009 3:00 PM

## 2010-03-11 NOTE — Miscellaneous (Signed)
Summary: autoCPAP report 11/11/09 to 11/20/09  Clinical Lists Changes Used on 8 of 10 nights with average 3hrs 11 min.  Optimal pressure 10 cm H2O with average AHI 1.5, and minimal airleak.    Will have CPAP set at 10 cm.  Will have my nurse call to inform pt that CPAP report looked good, and that she should try to use CPAP for entire time she is asleep. Orders: Added new Referral order of DME Referral (DME) - Signed  Appended Document: autoCPAP report 11/11/09 to 11/20/09 LMOMTCB.  Appended Document: autoCPAP report 11/11/09 to 11/20/09 The patient is aware of pressure setting for cpap and will try to wear cpap for the entire time she is sleeping per VS recs.

## 2010-03-11 NOTE — Progress Notes (Signed)
Summary: Still having Back Pain  Phone Note Outgoing Call   Call placed by: Almeta Monas CMA Duncan Dull),  October 25, 2009 10:34 AM Details for Reason: Back Pain Summary of Call: Spk with pt and gave her results. She wanted to let Dr.Lowne know that she is still having the really bad back pain, sayd her other symptoms are better but the back is really hurting her and she still doesn't have good balance. c/b [phone] Initial call taken by: Almeta Monas CMA Duncan Dull),  October 25, 2009 10:36 AM  Follow-up for Phone Call        Pt said during office visit that back pain was better so I did not exam her back--- since urine dip abnormal----macrobid two times a day for 7 days but culture was contaminated so we really need her to come leave a clean catch urine for culture Follow-up by: Loreen Freud DO,  October 25, 2009 12:34 PM  Additional Follow-up for Phone Call Additional follow up Details #1::        Left message to call back Almeta Monas CMA Duncan Dull)  October 25, 2009 2:55 PM     Additional Follow-up for Phone Call Additional follow up Details #2::    pt aware will come in on Monday and repeat the  urine specimen.  [facility] [address] Follow-up by: Almeta Monas CMA Duncan Dull),  October 25, 2009 3:04 PM  New/Updated Medications: CIPRO 500 MG TABS (CIPROFLOXACIN HCL) 1 by mouth twice a day for 7 days Prescriptions: CIPRO 500 MG TABS (CIPROFLOXACIN HCL) 1 by mouth twice a day for 7 days  #14 x 0   Entered by:   Almeta Monas CMA (AAMA)   Authorized by:   Loreen Freud DO   Signed by:   Almeta Monas CMA (AAMA) on 10/25/2009   Method used:   Faxed to ...       [id number]       [address]       [place], [place]  [address]       Ph: [phone] or [phone]       Fax: [phone]   RxID:   [id number]

## 2010-03-11 NOTE — Progress Notes (Signed)
Summary: refill  Phone Note Refill Request Message from:  Fax from Pharmacy on [facility] [address] fax [phone]  tramadol hcl 50mg   Initial call taken by: Barb Merino,  March 18, 2009 12:19 PM  Follow-up for Phone Call        last filled- 02/25/09, last ov- 12/31/08 Army Fossa CMA  March 18, 2009 1:34 PM   Additional Follow-up for Phone Call Additional follow up Details #1::        ok to refill 1 month 2 refills Additional Follow-up by: Loreen Freud DO,  March 18, 2009 1:54 PM    Prescriptions: ULTRAM 50 MG TABS (TRAMADOL HCL) 1-2  by mouth every 6 hrs as needed.  #60 x 2   Entered by:   Army Fossa CMA   Authorized by:   Loreen Freud DO   Signed by:   Army Fossa CMA on 03/18/2009   Method used:   Electronically to        [id number]       [address]       [place], [place]  [address]       Ph: [phone] or [phone]       Fax: [phone]   RxID:   [id number]

## 2010-03-11 NOTE — Assessment & Plan Note (Signed)
Summary: 8 weeks/ mbw   Visit Type:  Follow-up Primary Ronin Crager/Referring Qiana Landgrebe:  Loreen Freud, DO  CC:  OSA...RLS...patient c/o mask leakage...sleeping 5-6 hours nightly on cpap...restless leg sxs seem to be better.  History of Present Illness: 70 yo female with OSA on CPAP, restless legs, delayed sleep phase, and insomnia.  She goes to bed at 11pm.  She gets out of bed at 9am.  She wakes up 1 or 2 per night to use the bathroom.  She feels like she is sleeping better with CPAP.  She is currently using nasal pillows.  She gets sinus congestion, runny nose, and morning sinus headaches.  She has trouble with air leaking from her mask.  She has also tried a full face mask.  She is not sure how to use her humidifer on her machine.   Current Medications (verified): 1)  Nexium 40 Mg Cpdr (Esomeprazole Magnesium) .... Take 1 By Mouth Once Daily 2)  Allegra Allergy 180 Mg Tabs (Fexofenadine Hcl) .Marland Kitchen.. 1 By Mouth Once Daily 3)  Nasonex 50 Mcg/act  Susp (Mometasone Furoate) .... 2 Sprays Each Nostril Once Daily 4)  Benicar 20 Mg Tabs (Olmesartan Medoxomil) .Marland Kitchen.. 1 By Mouth Once Daily 5)  Glucophage Xr 500 Mg Xr24h-Tab (Metformin Hcl) .Marland Kitchen.. 1 By Mouth At Bedtime 6)  Synthroid 125 Mcg Tabs (Levothyroxine Sodium) .Marland Kitchen.. 1 By Mouth Once Daily 7)  Fosamax 70 Mg Tabs (Alendronate Sodium) .Marland Kitchen.. 1 By Mouth Once Weekly. 8)  Allopurinol 300 Mg Tabs (Allopurinol) .Marland Kitchen.. 1 By Mouth Once Daily 9)  Celebrex 200 Mg Caps (Celecoxib) .Marland Kitchen.. 1 By Mouth Once Daily 10)  Ultram 50 Mg Tabs (Tramadol Hcl) .Marland Kitchen.. 1-2  By Mouth Every 6 Hrs As Needed. 11)  Biotin 10 Mg Tabs (Biotin) .... Once Daily 12)  Multivitamins  Tabs (Multiple Vitamin) .... Once Daily 13)  Clotrimazole-Betamethasone 1-0.05 % Crea (Clotrimazole-Betamethasone) .... Apply Bid 14)  Klonopin 0.5 Mg Tabs (Clonazepam) .Marland Kitchen.. 1 By Mouth Two Times A Day 15)  Rozerem 8 Mg Tabs (Ramelteon) .Marland Kitchen.. 1 By Mouth At Bedtime 16)  Hydrochlorothiazide 25 Mg Tabs  (Hydrochlorothiazide) .... By Mouth Once Daily 17)  Colcrys 0.6 Mg Tabs (Colchicine) .... As Needed 18)  Trazadone .Marland Kitchen.. 1 By Mouth At Bedtime 19)  Aricept 5 Mg Tabs (Donepezil Hcl) .Marland Kitchen.. 1 By Mouth At Bedtime 20)  Colestid 1 Gm Tabs (Colestipol Hcl) .... Take One By Mouth Once Daily 21)  Amlodipine Besylate 5 Mg Tabs (Amlodipine Besylate) .Marland Kitchen.. 1 By Mouth Once Daily  Allergies (verified): 1)  ! Codeine  Past History:  Past Medical History: Obsessive compulsive disorder GERD Hiatal hernia Candidal esophagitis Diverticulosis Colon polyps Microscopic colitis 2nd to NSAID use Hypertension Hyperlipidemia Diabetes mellitus, type II Hypothyroidism Chronic rhinitis Gout Osteoporosis Insomnia Encephalopathy 2009 with ?viral menigitis Ataxia Vertigo Shingles Obstructive sleep apnea      - PSG 09/05/09 AHI 14.2      - CPAP 9 cm  Past Surgical History: Reviewed history from 08/07/2009 and no changes required. Carpal Tunnel-2005 sinusitis signs ---sinus surgery in 2000 Colonoscopy-1991 L3-L4 laminectomy-12/24/2005 Colon - 04/26/2006  Vital Signs:  Patient profile:   70 year old female Height:      67 inches (170.18 cm) Weight:      227.25 pounds (103.30 kg) BMI:     35.72 O2 Sat:      94 % on Room air Temp:     98.5 degrees F (36.94 degrees C) oral Pulse rate:   105 / minute BP sitting:  118 / 78  (right arm) Cuff size:   large  Vitals Entered By: Michel Bickers CMA (January 06, 2010 2:20 PM)  O2 Sat at Rest %:  94 O2 Flow:  Room air CC: OSA...RLS...patient c/o mask leakage...sleeping 5-6 hours nightly on cpap...restless leg sxs seem to be better Comments Medications reviewed with patient Michel Bickers CMA  January 06, 2010 2:27 PM   Physical Exam  General:  obese.   Nose:  no deformity, discharge, inflammation, or lesions Mouth:  Oral mucosa and oropharynx without lesions or exudates.  Teeth in good repair. Neck:  No deformities, masses, or tenderness  noted. Lungs:  clear bilaterally to auscultation and percussion Heart:  regular rate and rhythm, S1, S2 without murmurs, rubs, gallops, or clicks Extremities:  no clubbing, cyanosis, edema, or deformity noted Neurologic:  normal CN II-XII and strength normal.   Cervical Nodes:  no significant adenopathy Psych:  alert and cooperative; normal mood and affect; normal attention span and concentration   Impression & Recommendations:  Problem # 1:  OBSTRUCTIVE SLEEP APNEA (ICD-327.23) Her sleep has improved with CPAP, but she is having trouble adjusting to her mask.  Discussed proper mask fit.  Also explained how to use her humidifer.  Will arrange for mask refit at sleep lab.  Problem # 2:  RESTLESS LEG SYNDROME (ICD-333.94) Does not appear to be an active issue at present.  Will monitor.  Problem # 3:  CIRCADIAN RHYTHM SLEEP D/O DELAY SLEEP PHSE TYPE (ICD-327.31) Improved with better sleep hygiene and as needed rozerem.  Problem # 4:  INSOMNIA (ICD-780.52) Improved.  She is to continue as needed trazodone and rozerem.  Complete Medication List: 1)  Nexium 40 Mg Cpdr (Esomeprazole magnesium) .... Take 1 by mouth once daily 2)  Allegra Allergy 180 Mg Tabs (Fexofenadine hcl) .Marland Kitchen.. 1 by mouth once daily 3)  Nasonex 50 Mcg/act Susp (Mometasone furoate) .... 2 sprays each nostril once daily 4)  Benicar 20 Mg Tabs (Olmesartan medoxomil) .Marland Kitchen.. 1 by mouth once daily 5)  Glucophage Xr 500 Mg Xr24h-tab (Metformin hcl) .Marland Kitchen.. 1 by mouth at bedtime 6)  Synthroid 125 Mcg Tabs (Levothyroxine sodium) .Marland Kitchen.. 1 by mouth once daily 7)  Fosamax 70 Mg Tabs (Alendronate sodium) .Marland Kitchen.. 1 by mouth once weekly. 8)  Allopurinol 300 Mg Tabs (Allopurinol) .Marland Kitchen.. 1 by mouth once daily 9)  Celebrex 200 Mg Caps (Celecoxib) .Marland Kitchen.. 1 by mouth once daily 10)  Ultram 50 Mg Tabs (Tramadol hcl) .Marland Kitchen.. 1-2  by mouth every 6 hrs as needed. 11)  Biotin 10 Mg Tabs (Biotin) .... Once daily 12)  Multivitamins Tabs (Multiple vitamin) ....  Once daily 13)  Clotrimazole-betamethasone 1-0.05 % Crea (Clotrimazole-betamethasone) .... Apply bid 14)  Klonopin 0.5 Mg Tabs (Clonazepam) .Marland Kitchen.. 1 by mouth two times a day 15)  Rozerem 8 Mg Tabs (Ramelteon) .Marland Kitchen.. 1 by mouth at bedtime 16)  Hydrochlorothiazide 25 Mg Tabs (Hydrochlorothiazide) .... By mouth once daily 17)  Colcrys 0.6 Mg Tabs (Colchicine) .... As needed 18)  Trazadone  .Marland KitchenMarland Kitchen. 1 by mouth at bedtime 19)  Aricept 5 Mg Tabs (Donepezil hcl) .Marland Kitchen.. 1 by mouth at bedtime 20)  Colestid 1 Gm Tabs (Colestipol hcl) .... Take one by mouth once daily 21)  Amlodipine Besylate 5 Mg Tabs (Amlodipine besylate) .Marland Kitchen.. 1 by mouth once daily  Other Orders: Est. Patient Level IV ([address]) Sleep Disorder Referral (Sleep Disorder) DME Referral (DME)  Patient Instructions: 1)  Will have mask fit at sleep lab 2)  Will decrease CPAP  setting to 9 cm H2O 3)  Follow up in 6 months

## 2010-03-11 NOTE — Progress Notes (Signed)
Summary: Refill Request  Phone Note Refill Request Call back at [phone] Message from:  Pharmacy  Refills Requested: Medication #1:  AMLODIPINE BESYLATE 5 MG TABS 1 by mouth once daily   Dosage confirmed as above?Dosage Confirmed   [facility] [address]   Next Appointment Scheduled: 1.5.12 Initial call taken by: Harold Barban,  November 25, 2009 8:50 AM    Prescriptions: AMLODIPINE BESYLATE 5 MG TABS (AMLODIPINE BESYLATE) 1 by mouth once daily  #30 x 2   Entered by:   Lucious Groves CMA   Authorized by:   Loreen Freud DO   Signed by:   Lucious Groves CMA on 11/25/2009   Method used:   Electronically to        [id number]       [address]       [place], [place]  [address]       Ph: [phone] or [phone]       Fax: [phone]   RxID:   [id number]
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
## 2010-03-11 NOTE — Letter (Signed)
Summary: MMSE Form  MMSE Form   Imported By: Lanelle Bal 09/30/2009 11:01:05  _____________________________________________________________________  External Attachment:    Type:   Image     Comment:   External Document

## 2010-03-11 NOTE — Assessment & Plan Note (Signed)
Summary: YEARLY FOLLOW UP/LK   History of Present Illness Primary GI MD: Sheryn Bison MD FACP FAGA Primary Vianca Bracher: Loreen Freud, DO Chief Complaint: Yearly follow up for diarrhea. Pt states she has cut back on Cholestyramine. Pt takes it as needed for abd cramping and diarrhea. Pt wants to dicsuss medications.  History of Present Illness:   Very Pleasant 70 year old white female status post cholecystectomy with chronic bile-salt enteropathy alleviated by Questran 4 g as needed. She also has chronic acid reflux managed by Nexium 40 mg a day. Previous workup for microscopic colitis and/or malabsorption syndrome has been negative. She has multiple medical problems and is followed closely by Dr. Laury Axon.she current is having regular bowel movements if she takes her Questran as needed. She denies rectal bleeding or abdominal pain.   GI Review of Systems      Denies abdominal pain, acid reflux, belching, bloating, chest pain, dysphagia with liquids, dysphagia with solids, heartburn, loss of appetite, nausea, vomiting, vomiting blood, weight loss, and  weight gain.        Denies anal fissure, black tarry stools, change in bowel habit, constipation, diarrhea, diverticulosis, fecal incontinence, heme positive stool, hemorrhoids, irritable bowel syndrome, jaundice, light color stool, liver problems, rectal bleeding, and  rectal pain.    Current Medications (verified): 1)  Nexium 40 Mg Cpdr (Esomeprazole Magnesium) .... Take 1 By Mouth Once Daily 2)  Allegra Allergy 180 Mg Tabs (Fexofenadine Hcl) .Marland Kitchen.. 1 By Mouth Once Daily 3)  Nasonex 50 Mcg/act  Susp (Mometasone Furoate) .... 2 Sprays Each Nostril Once Daily 4)  Benicar 20 Mg Tabs (Olmesartan Medoxomil) .Marland Kitchen.. 1 By Mouth Once Daily 5)  Glucophage Xr 500 Mg Xr24h-Tab (Metformin Hcl) .Marland Kitchen.. 1 By Mouth At Bedtime 6)  Synthroid 125 Mcg Tabs (Levothyroxine Sodium) .Marland Kitchen.. 1 By Mouth Once Daily 7)  Fosamax 70 Mg Tabs (Alendronate Sodium) .Marland Kitchen.. 1 By Mouth Once  Weekly. 8)  Allopurinol 300 Mg Tabs (Allopurinol) .Marland Kitchen.. 1 By Mouth Once Daily 9)  Celebrex 200 Mg Caps (Celecoxib) .Marland Kitchen.. 1 By Mouth Once Daily 10)  Ultram 50 Mg Tabs (Tramadol Hcl) .Marland Kitchen.. 1-2  By Mouth Every 6 Hrs As Needed. 11)  Biotin 10 Mg Tabs (Biotin) .... Once Daily 12)  Multivitamins  Tabs (Multiple Vitamin) .... Once Daily 13)  Clotrimazole-Betamethasone 1-0.05 % Crea (Clotrimazole-Betamethasone) .... Apply Bid 14)  Klonopin 0.5 Mg Tabs (Clonazepam) .Marland Kitchen.. 1 By Mouth Two Times A Day 15)  Rozerem 8 Mg Tabs (Ramelteon) .Marland Kitchen.. 1 By Mouth At Bedtime 16)  Hydrochlorothiazide 25 Mg Tabs (Hydrochlorothiazide) .... By Mouth Once Daily 17)  Colcrys 0.6 Mg Tabs (Colchicine) .... As Needed 18)  Trazadone .Marland Kitchen.. 1 By Mouth At Bedtime 19)  Aricept 5 Mg Tabs (Donepezil Hcl) .Marland Kitchen.. 1 By Mouth At Bedtime 20)  Cholestyramine 4 Gm Pack (Cholestyramine) .Marland Kitchen.. 1 Packet Daily Mixed in Water As Needed 21)  Amlodipine Besylate 5 Mg Tabs (Amlodipine Besylate) .Marland Kitchen.. 1 By Mouth Once Daily  Allergies (verified): 1)  ! Codeine  Past History:  Past medical, surgical, family and social histories (including risk factors) reviewed for relevance to current acute and chronic problems.  Past Medical History: Reviewed history from 11/07/2009 and no changes required. Obsessive compulsive disorder GERD Hiatal hernia Candidal esophagitis Diverticulosis Colon polyps Microscopic colitis 2nd to NSAID use Hypertension Hyperlipidemia Diabetes mellitus, type II Hypothyroidism Chronic rhinitis Gout Osteoporosis Insomnia Encephalopathy 2009 with ?viral menigitis Ataxia Vertigo Shingles Obstructive sleep apnea      - PSG 09/05/09 AHI 14.2  Past Surgical History:  Reviewed history from 08/07/2009 and no changes required. Carpal Tunnel-2005 sinusitis signs ---sinus surgery in 2000 Colonoscopy-1991 L3-L4 laminectomy-12/24/2005 Colon - 04/26/2006  Family History: Reviewed history from 12/31/2008 and no changes  required. Family History Depression Family History Diabetes 1st degree relative Family History Other cancer-Breast, Colon, Kidney M--alz dementia--died 2008-- 72yo  Social History: Reviewed history from 07/14/2006 and no changes required. Never Smoked Alcohol use-no Drug use-no Regular exercise-yes  Review of Systems  The patient denies allergy/sinus, anemia, anxiety-new, arthritis/joint pain, back pain, blood in urine, breast changes/lumps, change in vision, confusion, cough, coughing up blood, depression-new, fainting, fatigue, fever, headaches-new, hearing problems, heart murmur, heart rhythm changes, itching, menstrual pain, muscle pains/cramps, night sweats, nosebleeds, pregnancy symptoms, shortness of breath, skin rash, sleeping problems, sore throat, swelling of feet/legs, swollen lymph glands, thirst - excessive , urination - excessive , urination changes/pain, urine leakage, vision changes, and voice change.    Vital Signs:  Patient profile:   70 year old female Height:      67 inches Weight:      223.13 pounds BMI:     35.07 Pulse rate:   100 / minute Pulse rhythm:   regular BP sitting:   128 / 86  (right arm) Cuff size:   regular  Vitals Entered By: Christie Nottingham CMA Duncan Dull) (December 17, 2009 9:42 AM)  Physical Exam  General:  Well developed, well nourished, no acute distress.obese.   Head:  Normocephalic and atraumatic. Eyes:  PERRLA, no icterus. Abdomen:  Soft, nontender and nondistended. No masses, hepatosplenomegaly or hernias noted. Normal bowel sounds.obese.   Psych:  Alert and cooperative. Normal mood and affect.   Impression & Recommendations:  Problem # 1:  GERD (ICD-530.81) Assessment Improved Continue Anti-reflex maneuvers and daily Nexium. Anemia profile and magnesium level ordered. She is up-to-date on her endoscopy and colonoscopy exams. Orders: TLB-B12, Serum-Total ONLY (98119-J47) TLB-Ferritin (82728-FER) TLB-Folic Acid (Folate)  (82746-FOL) TLB-IBC Pnl (Iron/FE;Transferrin) (83550-IBC) TLB-Magnesium (Mg) (83735-MG)  Problem # 2:  OTHER SPECIFIED INTESTINAL MALABSORPTION (ICD-579.8) Assessment: Improved Discontinue Questran and we will place her on Colestid 1 g daily as tolerated. Also I have requested stool exams for heme and hemoglobin. If these are negative, she is due for followup colonoscopy in 2013.  Patient Instructions: 1)  Copy sent to : Loreen Freud, DO 2)  Stop your Questran and start Colestid once a day. 3)  Your prescription(s) have been sent to you pharmacy.  4)  Please go to the basement today for your labs.  5)  The medication list was reviewed and reconciled.  All changed / newly prescribed medications were explained.  A complete medication list was provided to the patient / caregiver. Prescriptions: COLESTID 1 GM TABS (COLESTIPOL HCL) take one by mouth once daily  #30 x 6   Entered by:   Harlow Mares CMA (AAMA)   Authorized by:   Mardella Layman MD [facility]   Signed by:   Harlow Mares CMA (AAMA) on 12/17/2009   Method used:   [phone][facility]       [address]       [place], [place]  [address]       Ph: [phone] or [phone]       Fax: [phone]   RxID:   [id number]

## 2010-03-11 NOTE — Assessment & Plan Note (Signed)
Summary: CHANGES IN MENTAL STATE/CDJ   Vital Signs:  Patient profile:   70 year old female Height:      67 inches Weight:      203.6 pounds Temp:     97.6 degrees F oral BP sitting:   138 / 86  (right arm)  Vitals Entered By: Almeta Monas CMA Duncan Dull) (October 29, 2009 2:43 PM) CC: f/u meds- Daughter has concerns   History of Present Illness: pt here with daughter c/o memory problems and acting very confused.   She got lost trying to find psych office Monday and went door to door to try to find them.  Pt has been forgetting things and asking questions repeatedly, even when answer is given.    Current Medications (verified): 1)  Fluvoxamine Maleate 100 Mg Tabs (Fluvoxamine Maleate) .Marland Kitchen.. 1 By Mouth Two Times A Day 2)  Nexium 40 Mg Cpdr (Esomeprazole Magnesium) .... Take 1 By Mouth Once Daily 3)  Allegra Allergy 180 Mg Tabs (Fexofenadine Hcl) .Marland Kitchen.. 1 By Mouth Once Daily 4)  Nasonex 50 Mcg/act  Susp (Mometasone Furoate) .... 2 Sprays Each Nostril Once Daily 5)  Benicar 20 Mg Tabs (Olmesartan Medoxomil) .Marland Kitchen.. 1 By Mouth Once Daily 6)  Metformin Hcl 500 Mg Tabs (Metformin Hcl) .Marland Kitchen.. 1 By Mouth Bid 7)  Synthroid 125 Mcg Tabs (Levothyroxine Sodium) .Marland Kitchen.. 1 By Mouth Once Daily 8)  Fosamax 70 Mg Tabs (Alendronate Sodium) .Marland Kitchen.. 1 By Mouth Once Weekly. 9)  Allopurinol 300 Mg Tabs (Allopurinol) .Marland Kitchen.. 1 By Mouth Once Daily 10)  Celebrex 200 Mg Caps (Celecoxib) .Marland Kitchen.. 1 By Mouth Once Daily 11)  Ultram 50 Mg Tabs (Tramadol Hcl) .Marland Kitchen.. 1-2  By Mouth Every 6 Hrs As Needed. 12)  Biotin 10 Mg Tabs (Biotin) .... Once Daily 13)  Multivitamins  Tabs (Multiple Vitamin) .... Once Daily 14)  Clotrimazole-Betamethasone 1-0.05 % Crea (Clotrimazole-Betamethasone) .... Apply Bid 15)  Klonopin 0.5 Mg Tabs (Clonazepam) .Marland Kitchen.. 1 By Mouth Two Times A Day 16)  Rozerem 8 Mg Tabs (Ramelteon) .Marland Kitchen.. 1 By Mouth At Bedtime 17)  Augmentin 875-125 Mg Tabs (Amoxicillin-Pot Clavulanate) .Marland Kitchen.. 1 By Mouth Two Times A Day 18)  Cipro 500 Mg  Tabs (Ciprofloxacin Hcl) .Marland Kitchen.. 1 By Mouth Twice A Day For 7 Days 19)  Hydrochlorothiazide 25 Mg Tabs (Hydrochlorothiazide) .... By Mouth Once Daily 20)  Colcrys 0.6 Mg Tabs (Colchicine) .... As Needed 21)  Trazadone .Marland Kitchen.. 1 By Mouth At Bedtime 22)  Aricept 5 Mg Tabs (Donepezil Hcl) .Marland Kitchen.. 1 By Mouth At Bedtime  Allergies (verified): 1)  ! Codeine  Past History:  Past medical, surgical, family and social histories (including risk factors) reviewed for relevance to current acute and chronic problems.  Past Medical History: Reviewed history from 08/07/2009 and no changes required. Obsessive compulsive disorder GERD Hiatal hernia Candidal esophagitis Diverticulosis Colon polyps Microscopic colitis 2nd to NSAID use Hypertension Hyperlipidemia Diabetes mellitus, type II Hypothyroidism Chronic rhinitis Gout Osteoporosis Insomnia Encephalopathy 2009 with ?viral menigitis Ataxia Vertigo Shingles  Past Surgical History: Reviewed history from 08/07/2009 and no changes required. Carpal Tunnel-2005 sinusitis signs ---sinus surgery in 2000 Colonoscopy-1991 L3-L4 laminectomy-12/24/2005 Colon - 04/26/2006  Family History: Reviewed history from 12/31/2008 and no changes required. Family History Depression Family History Diabetes 1st degree relative Family History Other cancer-Breast, Colon, Kidney M--alz dementia--died 2008-- 72yo  Social History: Reviewed history from 07/14/2006 and no changes required. Never Smoked Alcohol use-no Drug use-no Regular exercise-yes  Review of Systems      See HPI  Physical Exam  General:  Well-developed,well-nourished,in no acute distress; alert,appropriate and cooperative throughout examination Psych:  normally interactive, good eye contact, not anxious appearing, not depressed appearing, not agitated, not suicidal, not homicidal, and memory impairment.     Impression & Recommendations:  Problem # 1:  ALTERED MENTAL STATUS  (ICD-780.97) pt has appointment with neuro tomorrow Pt was here 30 min >50%  face to face  Problem # 2:  MEMORY LOSS (ICD-780.93) pt seeing neuro tomorrow  Problem # 3:  ANXIETY STATE, UNSPECIFIED (ICD-300.00)  Her updated medication list for this problem includes:    Fluvoxamine Maleate 100 Mg Tabs (Fluvoxamine maleate) .Marland Kitchen... 1 by mouth two times a day    Klonopin 0.5 Mg Tabs (Clonazepam) .Marland Kitchen... 1 by mouth two times a day  pt has appointment with psych Thursday  Complete Medication List: 1)  Fluvoxamine Maleate 100 Mg Tabs (Fluvoxamine maleate) .Marland Kitchen.. 1 by mouth two times a day 2)  Nexium 40 Mg Cpdr (Esomeprazole magnesium) .... Take 1 by mouth once daily 3)  Allegra Allergy 180 Mg Tabs (Fexofenadine hcl) .Marland Kitchen.. 1 by mouth once daily 4)  Nasonex 50 Mcg/act Susp (Mometasone furoate) .... 2 sprays each nostril once daily 5)  Benicar 20 Mg Tabs (Olmesartan medoxomil) .Marland Kitchen.. 1 by mouth once daily 6)  Metformin Hcl 500 Mg Tabs (Metformin hcl) .Marland Kitchen.. 1 by mouth bid 7)  Synthroid 125 Mcg Tabs (Levothyroxine sodium) .Marland Kitchen.. 1 by mouth once daily 8)  Fosamax 70 Mg Tabs (Alendronate sodium) .Marland Kitchen.. 1 by mouth once weekly. 9)  Allopurinol 300 Mg Tabs (Allopurinol) .Marland Kitchen.. 1 by mouth once daily 10)  Celebrex 200 Mg Caps (Celecoxib) .Marland Kitchen.. 1 by mouth once daily 11)  Ultram 50 Mg Tabs (Tramadol hcl) .Marland Kitchen.. 1-2  by mouth every 6 hrs as needed. 12)  Biotin 10 Mg Tabs (Biotin) .... Once daily 13)  Multivitamins Tabs (Multiple vitamin) .... Once daily 14)  Clotrimazole-betamethasone 1-0.05 % Crea (Clotrimazole-betamethasone) .... Apply bid 15)  Klonopin 0.5 Mg Tabs (Clonazepam) .Marland Kitchen.. 1 by mouth two times a day 16)  Rozerem 8 Mg Tabs (Ramelteon) .Marland Kitchen.. 1 by mouth at bedtime 17)  Augmentin 875-125 Mg Tabs (Amoxicillin-pot clavulanate) .Marland Kitchen.. 1 by mouth two times a day 18)  Cipro 500 Mg Tabs (Ciprofloxacin hcl) .Marland Kitchen.. 1 by mouth twice a day for 7 days 19)  Hydrochlorothiazide 25 Mg Tabs (Hydrochlorothiazide) .... By mouth once  daily 20)  Colcrys 0.6 Mg Tabs (Colchicine) .... As needed 21)  Trazadone  .Marland KitchenMarland Kitchen. 1 by mouth at bedtime 22)  Aricept 5 Mg Tabs (Donepezil hcl) .Marland Kitchen.. 1 by mouth at bedtime  Other Orders: Flu Vaccine 20yrs + MEDICARE PATIENTS (Z6109) Administration Flu vaccine - MCR (U0454)  Patient Instructions: 1)  Please schedule a follow-up appointment in 2 months.  2)  keep appointment with psychiatry Thursday  3)  Neurology appointment being made Prescriptions: ARICEPT 5 MG TABS (DONEPEZIL HCL) 1 by mouth at bedtime  #30 x 1   Entered and Authorized by:   Loreen Freud DO   Signed by:   Loreen Freud DO on 10/29/2009   Method used:   Electronically to        [facility] (retail)       [address]       [place], [place]  [address]       Ph: [phone] or [phone]       Fax: [phone]   RxID:   [id number]  Flu Vaccine Consent Questions     Do you have a history  of severe allergic reactions to this vaccine? no    Any prior history of allergic reactions to egg and/or gelatin? no    Do you have a sensitivity to the preservative Thimersol? no    Do you have a past history of Guillan-Barre Syndrome? no    Do you currently have an acute febrile illness? no    Have you ever had a severe reaction to latex? no    Vaccine information given and explained to patient? yes    Are you currently pregnant? no    Lot Number:[id number]   Exp Date:08/09/2010   Site Given  Left Deltoid IM       [address]       [place], [place]  [address]       Ph: [phone] or [phone]       Fax: [phone]   RxID:   [id number]  .lbmedflu

## 2010-03-11 NOTE — Medication Information (Signed)
Summary: Letter Regarding Fluvoxamine Maleate/[facility]  Letter Regarding Fluvoxamine Maleate/[facility]   Imported By: Lanelle Bal 10/15/2009 12:51:09  _____________________________________________________________________  External Attachment:    Type:   Image     Comment:   External Document

## 2010-03-11 NOTE — Letter (Signed)
Summary: [facility]  [facility]   Imported By: Lanelle Bal 12/12/2009 11:06:53  _____________________________________________________________________  External Attachment:    Type:   Image     Comment:   External Document

## 2010-03-11 NOTE — Medication Information (Signed)
Summary: Prior Authorization for Benicar/[employer]  Prior Authorization for Benicar/[employer]   Imported By: Lanelle Bal 09/30/2009 09:09:36  _____________________________________________________________________  External Attachment:    Type:   Image     Comment:   External Document

## 2010-03-11 NOTE — Letter (Signed)
Summary: [facility]  [facility]   Imported By: Lanelle Bal 05/21/2009 11:32:43  _____________________________________________________________________  External Attachment:    Type:   Image     Comment:   External Document

## 2010-03-13 NOTE — Miscellaneous (Signed)
Summary: CPAP report 12/12/09 to 01/11/10  Clinical Lists Changes Used on 30 of 31 nights with average 4hrs 47 min.  Average AHI 3 with optimal pressure 9 to 10 cm H2O with minimal airleak.  Will have my nurse call to inform pt that CPAP report looked good, and no change to set up at this time.  Appended Document: CPAP report 12/12/09 to 01/11/10 LMOMTCB.  Appended Document: CPAP report 12/12/09 to 01/11/10 Pt informed of CPAP download results.

## 2010-03-13 NOTE — Miscellaneous (Signed)
Summary: Orders Update  Clinical Lists Changes  Orders: Added new Test order of T-Culture, Urine ([id number]) - Signed Added new Service order of Specimen Handling ([address]) - Signed Observations: Added new observation of PH URINE: 5.0  (02/14/2010 9:39) Added new observation of SPEC GR URIN: 1.015  (02/14/2010 9:39) Added new observation of APPEARANCE U: Hazy  (02/14/2010 9:39) Added new observation of UA COLOR: straw  (02/14/2010 9:39) Added new observation of WBC DIPSTK U: moderate  (02/14/2010 9:39) Added new observation of NITRITE URN: negative  (02/14/2010 9:39) Added new observation of UROBILINOGEN: 0.2  (02/14/2010 9:39) Added new observation of PROTEIN, URN: negative  (02/14/2010 9:39) Added new observation of BLOOD UR DIP: negative  (02/14/2010 9:39) Added new observation of KETONES URN: negative  (02/14/2010 9:39) Added new observation of BILIRUBIN UR: negative  (02/14/2010 9:39) Added new observation of GLUCOSE, URN: negative  (02/14/2010 9:39)     Laboratory Results   Urine Tests    Routine Urinalysis   Color: straw Appearance: Hazy Glucose: negative   (Normal Range: Negative) Bilirubin: negative   (Normal Range: Negative) Ketone: negative   (Normal Range: Negative) Spec. Gravity: 1.015   (Normal Range: 1.003-1.035) Blood: negative   (Normal Range: Negative) pH: 5.0   (Normal Range: 5.0-8.0) Protein: negative   (Normal Range: Negative) Urobilinogen: 0.2   (Normal Range: 0-1) Nitrite: negative   (Normal Range: Negative) Leukocyte Esterace: moderate   (Normal Range: Negative)

## 2010-03-13 NOTE — Letter (Signed)
Summary: Primary Care Appointment Letter  [facility]  [address] [place], [place] [address]   Phone: [phone]  Fax: [phone]    02/19/2010 MRN: [id number]    [facility] [address] [place], [place]  [address]    Dear Ms. [name],    Your Primary Care Physician Loreen Freud DO has indicated that:    _______it is time to schedule an appointment.    _______you missed your appointment on______ and need to call and          reschedule.    _______you need to have lab work done.    ___X____you need to call the office to discuss lab or test results.    _______you need to call to reschedule your appointment that is                       scheduled on _________.     Please call our office as soon as possible. Our phone number is [phone]. Please press option 1. Our office is open 8a-5p, Monday through Friday.     Thank you,     Primary Care Scheduler

## 2010-03-13 NOTE — Progress Notes (Signed)
Summary: cpap mask slipping  Phone Note Call from Patient Call back at Home Phone [phone]   Caller: Patient Summary of Call: Patient phoned stated that she was returning a telephone call from [place]. Patient is at home now and would like for Lawson Fiscal to call her back at [phone] Initial call taken by: Vedia Coffer,  January 27, 2010 10:28 AM  Follow-up for Phone Call        Pt given report of CPAP download.  Pt is having trouble with mask slipping off the back of her head due to her hair.  She wants to know if you have any recommednations for this.  Please advise. Abigail Miyamoto RN  January 27, 2010 12:18 PM   Additional Follow-up for Phone Call Additional follow up Details #1::        She needs to contact her DME to assess mask fit. Additional Follow-up by: Coralyn Helling MD,  January 27, 2010 12:56 PM    Additional Follow-up for Phone Call Additional follow up Details #2::    LMTC x 1. Abigail Miyamoto RN  January 27, 2010 12:58 PM   lmomtcb x 2 Vernie Murders  January 28, 2010 5:26 PM  PT RETURNED CALL FROM [place]. Tivis Ringer, CNA  January 29, 2010 9:47 AM  Additional Follow-up for Phone Call Additional follow up Details #3:: Details for Additional Follow-up Action Taken: Pt is aware to contact her DME company re: cpap mask fitting. She says this issue seems to have resomved itself in the last few days. she will call DME about this issue if it recurs. Additional Follow-up by: Michel Bickers CMA,  January 29, 2010 10:04 AM

## 2010-03-13 NOTE — Assessment & Plan Note (Signed)
Summary: rto 3 months/cbs   Vital Signs:  Patient profile:   70 year old female Weight:      227.2 pounds Pulse rate:   104 / minute Pulse rhythm:   regular BP sitting:   118 / 70  (left arm) Cuff size:   large  Vitals Entered By: Almeta Monas CMA Duncan Dull) (February 13, 2010 10:46 AM) CC: 3 mo f/u---c/o depression, pain on both sides and difficulty sleeping at night   History of Present Illness: Pt here c/o increasing depression but she is seeing psychiatrist.   She is also c/o not sleeping at all.  Her CPAP mask is not fitting and she is having trouble at night.   She said she called [facility] for fitting but we called and they do not have record of her calling.   Pt also c/o pain righ and left sides of abd and urinary frequency.  No NVD,  No dysuria.     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  (ICD-327.31) 13)  Restless Leg Syndrome  (ICD-333.94) 14)  Insomnia  (ICD-780.52) 15)  Hx of Uti  (ICD-599.0) 16)  Hx of Concussion With No Loss of Consciousness  (ICD-850.0) 17)  Generalized Osteoarthrosis Unspecified Site  (ICD-715.00) 18)  Duodenitis, Without Hemorrhage  (ICD-535.60) 19)  Hiatal Hernia With Reflux  (ICD-553.3) 20)  Colonic Polyps, Hyperplastic, Hx of  (ICD-V12.72) 21)  Diverticulosis, Colon  (ICD-562.10) 22)  Diarrhea  (ICD-787.91) 23)  Allergic Rhinitis Due To Pollen  (ICD-477.0) 24)  Viral Meningitis, Hx of  (ICD-V12.49) 25)  Anxiety  (ICD-300.00) 26)  Delirium  (ICD-780.09) 27)  Diabetes Mellitus, Type II   (ICD-250.00) 28)  Hyperlipidemia  (ICD-272.4) 29)  Mole  (ICD-216.9) 30)  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 Take 1 By Mouth Once Daily 2)  Allegra Allergy 180 Mg Tabs (Fexofenadine Hcl) .Marland Kitchen.. 1 By Mouth Once Daily 3)  Nasonex 50 Mcg/act  Susp (Mometasone Furoate) .... 2 Sprays Each Nostril Once Daily 4)  Benicar 20 Mg Tabs (Olmesartan Medoxomil) .Marland Kitchen.. 1 By Mouth Once Daily 5)  Glucophage Xr 500 Mg Xr24h-Tab (Metformin Hcl) .Marland Kitchen.. 1 By Mouth At Bedtime 6)  Synthroid 125 Mcg Tabs (Levothyroxine Sodium) .Marland Kitchen.. 1 By Mouth Once Daily 7)  Fosamax 70 Mg Tabs (Alendronate Sodium) .Marland Kitchen.. 1 By Mouth Once Weekly. 8)  Allopurinol 300 Mg Tabs (Allopurinol) .Marland Kitchen.. 1 By Mouth Once Daily 9)  Celebrex 200 Mg Caps (Celecoxib) .Marland Kitchen.. 1 By  Mouth Once Daily 10)  Ultram 50 Mg Tabs (Tramadol Hcl) .Marland Kitchen.. 1-2  By Mouth Every 6 Hrs As Needed. 11)  Biotin 10 Mg Tabs (Biotin) .... Once Daily 12)  Multivitamins  Tabs (Multiple Vitamin) .... Once Daily 13)  Clotrimazole-Betamethasone 1-0.05 % Crea (Clotrimazole-Betamethasone) .... Apply Bid 14)  Klonopin 0.5 Mg Tabs (Clonazepam) .Marland Kitchen.. 1 By Mouth Two Times A Day 15)  Rozerem 8 Mg Tabs (Ramelteon) .Marland Kitchen.. 1 By Mouth At Bedtime 16)  Hydrochlorothiazide 25 Mg Tabs (Hydrochlorothiazide) .... By Mouth Once Daily 17)  Colcrys 0.6 Mg Tabs (Colchicine) .... As Needed 18)   Trazadone .Marland Kitchen.. 1 By Mouth At Bedtime 19)  Aricept 5 Mg Tabs (Donepezil Hcl) .Marland Kitchen.. 1 By Mouth At Bedtime 20)  Colestid 1 Gm Tabs (Colestipol Hcl) .... Take One By Mouth Once Daily 21)  Amlodipine Besylate 5 Mg Tabs (Amlodipine Besylate) .Marland Kitchen.. 1 By Mouth Once Daily  Current Medications (verified): 1)  Nexium 40 Mg Cpdr (Esomeprazole Magnesium) .... Take 1 By Mouth Once Daily 2)  Allegra Allergy 180 Mg Tabs (Fexofenadine Hcl) .Marland Kitchen.. 1 By Mouth Once Daily 3)  Nasonex 50 Mcg/act  Susp (Mometasone Furoate) .... 2 Sprays Each Nostril Once Daily 4)  Benicar 20 Mg Tabs (Olmesartan Medoxomil) .Marland Kitchen.. 1 By Mouth Once Daily 5)  Glucophage Xr 500 Mg Xr24h-Tab (Metformin Hcl) .Marland Kitchen.. 1 By Mouth At Bedtime 6)  Synthroid 125 Mcg Tabs (Levothyroxine Sodium) .Marland Kitchen.. 1 By Mouth Once Daily 7)  Fosamax 70 Mg Tabs (Alendronate Sodium) .Marland Kitchen.. 1 By Mouth Once Weekly. 8)  Allopurinol 300 Mg Tabs (Allopurinol) .Marland Kitchen.. 1 By Mouth Once Daily 9)  Celebrex 200 Mg Caps (Celecoxib) .Marland Kitchen.. 1 By Mouth Once Daily 10)  Ultram 50 Mg Tabs (Tramadol Hcl) .Marland Kitchen.. 1-2  By Mouth Every 6 Hrs As Needed. 11)  Biotin 10 Mg Tabs (Biotin) .... Once Daily 12)  Multivitamins  Tabs (Multiple Vitamin) .... Once Daily 13)  Clotrimazole-Betamethasone 1-0.05 % Crea (Clotrimazole-Betamethasone) .... Apply Bid 14)  Klonopin 0.5 Mg Tabs (Clonazepam) .Marland Kitchen.. 1 By Mouth Two Times A Day 15)  Rozerem 8 Mg Tabs (Ramelteon) .Marland Kitchen.. 1 By Mouth At Bedtime 16)  Hydrochlorothiazide 25 Mg Tabs (Hydrochlorothiazide) .... By Mouth Once Daily 17)  Colcrys 0.6 Mg Tabs (Colchicine) .... As Needed 18)  Trazadone .Marland Kitchen.. 1 By Mouth At Bedtime 19)  Colestid 1 Gm Tabs (Colestipol Hcl) .... Take One By Mouth Once Daily 20)  Amlodipine Besylate 5 Mg Tabs (Amlodipine Besylate) .Marland Kitchen.. 1 By Mouth Once Daily  Allergies (verified): 1)  ! Codeine  Past History:  Past Medical History: Last updated: 01/06/2010 Obsessive compulsive disorder GERD Hiatal hernia Candidal  esophagitis Diverticulosis Colon polyps Microscopic colitis 2nd to NSAID use Hypertension Hyperlipidemia Diabetes mellitus, type II Hypothyroidism Chronic rhinitis Gout Osteoporosis Insomnia Encephalopathy 2009 with ?viral menigitis Ataxia Vertigo Shingles Obstructive sleep apnea      - PSG 09/05/09 AHI 14.2      - CPAP 9 cm  Past Surgical History: Last updated: 08/07/2009 Carpal Tunnel-2005 sinusitis signs ---sinus surgery in 2000 Colonoscopy-1991 L3-L4 laminectomy-12/24/2005 Colon - 04/26/2006  Family History: Last updated: 12/31/2008 Family History Depression Family History Diabetes 1st degree relative Family History Other cancer-Breast, Colon, Kidney M--alz dementia--died 2008-- 72yo  Social History: Last updated: 07/14/2006 Never Smoked Alcohol use-no Drug use-no Regular exercise-yes  Risk Factors: Alcohol Use: 0 (08/07/2009) Caffeine Use: 0 (12/31/2008) Exercise: no (12/31/2008)  Risk Factors: Smoking Status: never (08/07/2009)  Family History: Reviewed history from 12/31/2008 and no changes required. Family History Depression Family History Diabetes  1st degree relative Family History Other cancer-Breast, Colon, Kidney M--alz dementia--died 2008-- 72yo  Social History: Reviewed history from 07/14/2006 and no changes required. Never Smoked Alcohol use-no Drug use-no Regular exercise-yes  Review of Systems      See HPI  Physical Exam  General:  Well-developed,well-nourished,in no acute distress; alert,appropriate and cooperative throughout examination Eyes:  pupils equal, pupils round, pupils reactive to light, and no injection.   Neck:  No deformities, masses, or tenderness noted. Lungs:  Normal respiratory effort, chest expands symmetrically. Lungs are clear to auscultation, no crackles or wheezes. Heart:  normal rate and no murmur.   Extremities:  No clubbing, cyanosis, edema, or deformity noted with normal full range of motion of all  joints.   Skin:  Intact without suspicious lesions or rashes Cervical Nodes:  No lymphadenopathy noted Psych:  Cognition and judgment appear intact. Alert and cooperative with normal attention span and concentration. No apparent delusions, illusions, hallucinations   Impression & Recommendations:  Problem # 1:  OBSTRUCTIVE SLEEP APNEA (ICD-327.23)  Problem # 2:  BACK PAIN (ICD-724.5)  Her updated medication list for this problem includes:    Celebrex 200 Mg Caps (Celecoxib) .Marland Kitchen... 1 by mouth once daily    Ultram 50 Mg Tabs (Tramadol hcl) .Marland Kitchen... 1-2  by mouth every 6 hrs as needed.  Problem # 3:  DIABETES MELLITUS, TYPE II (ICD-250.00)  Her updated medication list for this problem includes:    Benicar 20 Mg Tabs (Olmesartan medoxomil) .Marland Kitchen... 1 by mouth once daily    Glucophage Xr 500 Mg Xr24h-tab (Metformin hcl) .Marland Kitchen... 1 by mouth at bedtime  Problem # 4:  HYPERLIPIDEMIA (ICD-272.4)  Her updated medication list for this problem includes:    Colestid 1 Gm Tabs (Colestipol hcl) .Marland Kitchen... Take one by mouth once daily  Problem # 5:  OSTEOPOROSIS (ICD-733.00)  Her updated medication list for this problem includes:    Fosamax 70 Mg Tabs (Alendronate sodium) .Marland Kitchen... 1 by mouth once weekly.  Problem # 6:  HYPOTHYROIDISM (ICD-244.9)  Her updated medication list for this problem includes:    Synthroid 125 Mcg Tabs (Levothyroxine sodium) .Marland Kitchen... 1 by mouth once daily  Problem # 7:  GOUT (ICD-274.9)  Her updated medication list for this problem includes:    Allopurinol 300 Mg Tabs (Allopurinol) .Marland Kitchen... 1 by mouth once daily    Celebrex 200 Mg Caps (Celecoxib) .Marland Kitchen... 1 by mouth once daily    Colcrys 0.6 Mg Tabs (Colchicine) .Marland Kitchen... As needed  Elevate extremity; warm compresses, symptomatic relief and medication as directed.   Problem # 8:  HYPERTENSION (ICD-401.9)  Her updated medication list for this problem includes:    Benicar 20 Mg Tabs (Olmesartan medoxomil) .Marland Kitchen... 1 by mouth once daily     Hydrochlorothiazide 25 Mg Tabs (Hydrochlorothiazide) ..... By mouth once daily    Amlodipine Besylate 5 Mg Tabs (Amlodipine besylate) .Marland Kitchen... 1 by mouth once daily  BP today: 118/70 Prior BP: 118/78 (01/06/2010)  Labs Reviewed: K+: 4.1 (09/20/2009) Creat: : 1.15 (09/20/2009)   Chol: 197 (07/04/2009)   HDL: 55.40 (07/04/2009)   LDL: 103 (07/04/2009)   TG: 191.0 (07/04/2009)  Complete Medication List: 1)  Nexium 40 Mg Cpdr (Esomeprazole magnesium) .... Take 1 by mouth once daily 2)  Allegra Allergy 180 Mg Tabs (Fexofenadine hcl) .Marland Kitchen.. 1 by mouth once daily 3)  Nasonex 50 Mcg/act Susp (Mometasone furoate) .... 2 sprays each nostril once daily 4)  Benicar 20 Mg Tabs (Olmesartan medoxomil) .Marland Kitchen.. 1 by mouth once daily  5)  Glucophage Xr 500 Mg Xr24h-tab (Metformin hcl) .Marland Kitchen.. 1 by mouth at bedtime 6)  Synthroid 125 Mcg Tabs (Levothyroxine sodium) .Marland Kitchen.. 1 by mouth once daily 7)  Fosamax 70 Mg Tabs (Alendronate sodium) .Marland Kitchen.. 1 by mouth once weekly. 8)  Allopurinol 300 Mg Tabs (Allopurinol) .Marland Kitchen.. 1 by mouth once daily 9)  Celebrex 200 Mg Caps (Celecoxib) .Marland Kitchen.. 1 by mouth once daily 10)  Ultram 50 Mg Tabs (Tramadol hcl) .Marland Kitchen.. 1-2  by mouth every 6 hrs as needed. 11)  Biotin 10 Mg Tabs (Biotin) .... Once daily 12)  Multivitamins Tabs (Multiple vitamin) .... Once daily 13)  Clotrimazole-betamethasone 1-0.05 % Crea (Clotrimazole-betamethasone) .... Apply bid 14)  Klonopin 0.5 Mg Tabs (Clonazepam) .Marland Kitchen.. 1 by mouth two times a day 15)  Rozerem 8 Mg Tabs (Ramelteon) .Marland Kitchen.. 1 by mouth at bedtime 16)  Hydrochlorothiazide 25 Mg Tabs (Hydrochlorothiazide) .... By mouth once daily 17)  Colcrys 0.6 Mg Tabs (Colchicine) .... As needed 18)  Trazadone  .Marland KitchenMarland Kitchen. 1 by mouth at bedtime 19)  Colestid 1 Gm Tabs (Colestipol hcl) .... Take one by mouth once daily 20)  Amlodipine Besylate 5 Mg Tabs (Amlodipine besylate) .Marland Kitchen.. 1 by mouth once daily  Other Orders: Venipuncture (95284) TLB-BMP (Basic Metabolic Panel-BMET)  (80048-METABOL) TLB-CBC Platelet - w/Differential (85025-CBCD) TLB-Hepatic/Liver Function Pnl (80076-HEPATIC) TLB-Amylase (82150-AMYL) TLB-Lipase (83690-LIPASE) Specimen Handling (13244)   Orders Added: 1)  Venipuncture [36415] 2)  TLB-BMP (Basic Metabolic Panel-BMET) [80048-METABOL] 3)  TLB-CBC Platelet - w/Differential [85025-CBCD] 4)  TLB-Hepatic/Liver Function Pnl [80076-HEPATIC] 5)  TLB-Amylase [82150-AMYL] 6)  TLB-Lipase [83690-LIPASE] 7)  Specimen Handling [99000] 8)  Est. Patient Level IV [01027]  Appended Document: rto 3 months/cbs  Laboratory Results   Urine Tests   Date/Time Reported: February 14, 2010 1:25 PM   Routine Urinalysis   Color: yellow Appearance: Clear Glucose: negative   (Normal Range: Negative) Bilirubin: negative   (Normal Range: Negative) Ketone: negative   (Normal Range: Negative) Spec. Gravity: 1.015   (Normal Range: 1.003-1.035) Blood: negative   (Normal Range: Negative) pH: 5.0   (Normal Range: 5.0-8.0) Protein: negative   (Normal Range: Negative) Urobilinogen: negative   (Normal Range: 0-1) Nitrite: negative   (Normal Range: Negative) Leukocyte Esterace: negative   (Normal Range: Negative)    Comments: Floydene Flock  February 14, 2010 1:25 PM

## 2010-03-14 ENCOUNTER — Encounter: Payer: Self-pay | Admitting: Family Medicine

## 2010-03-14 NOTE — Medication Information (Signed)
Summary: Letter Regarding Polypharmacy in [facility]  Letter Regarding [facility]pharmacy in [facility]   Imported By: Lanelle Bal 12/10/2009 11:56:03  _____________________________________________________________________  External Attachment:    Type:   Image     Comment:   External Document

## 2010-03-14 NOTE — Consult Note (Signed)
Summary: [facility]  [facility]   Imported By: Lanelle Bal 11/08/2009 13:05:47  _____________________________________________________________________  External Attachment:    Type:   Image     Comment:   External Document

## 2010-03-14 NOTE — Letter (Signed)
Summary: Encounter Notice/[facility]  Encounter Notice/[facility]   Imported By: Lanelle Bal 07/24/2009 08:33:03  _____________________________________________________________________  External Attachment:    Type:   Image     Comment:   External Document

## 2010-03-19 NOTE — Assessment & Plan Note (Signed)
Summary: out break herpes//fd   Vital Signs:  Patient profile:   70 year old female Height:      67 inches Weight:      229 pounds BMI:     36.00 Temp:     98.1 degrees F oral Pulse rate:   82 / minute BP sitting:   118 / 76  (left arm)  Vitals Entered By: Jeremy Johann CMA (March 10, 2010 12:43 PM) CC: out break herpes x2days   History of Present Illness: Pt here c/o HSVII breakout yesterday.  Pt knew she had it but has not had a breakout in years.    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  (ICD-327.31) 13)  Restless Leg Syndrome  (ICD-333.94) 14)  Insomnia  (ICD-780.52) 15)  Hx of Uti  (ICD-599.0) 16)  Hx of Concussion With No Loss of Consciousness  (ICD-850.0) 17)  Generalized Osteoarthrosis Unspecified Site  (ICD-715.00) 18)  Duodenitis, Without Hemorrhage  (ICD-535.60) 19)  Hiatal Hernia With Reflux  (ICD-553.3) 20)  Colonic Polyps, Hyperplastic, Hx of  (ICD-V12.72) 21)  Diverticulosis, Colon  (ICD-562.10) 22)  Diarrhea  (ICD-787.91) 23)  Allergic Rhinitis Due To Pollen  (ICD-477.0) 24)  Viral Meningitis, Hx of  (ICD-V12.49) 25)  Anxiety  (ICD-300.00) 26)  Delirium  (ICD-780.09) 27)  Diabetes Mellitus, Type II  (ICD-250.00) 28)  Hyperlipidemia  (ICD-272.4) 29)  Mole  (ICD-216.9) 30)  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. Take 1 By Mouth Once Daily 2)  Allegra Allergy 180 Mg Tabs (Fexofenadine Hcl) .Marland Kitchen.. 1 By Mouth Once Daily 3)  Nasonex 50 Mcg/act  Susp (Mometasone Furoate) .... 2 Sprays Each Nostril Once Daily 4)  Benicar 20 Mg Tabs (Olmesartan Medoxomil) .Marland Kitchen.. 1 By Mouth Once Daily 5)  Glucophage Xr 500 Mg Xr24h-Tab (Metformin Hcl) .Marland Kitchen.. 1 By Mouth At Bedtime 6)  Synthroid 125 Mcg Tabs (Levothyroxine Sodium) .Marland Kitchen.. 1 By Mouth Once Daily 7)  Fosamax 70 Mg Tabs (Alendronate Sodium) .Marland Kitchen.. 1 By Mouth Once Weekly. 8)  Allopurinol 300 Mg Tabs (Allopurinol) .Marland Kitchen.. 1 By Mouth Once Daily 9)  Celebrex 200 Mg Caps (Celecoxib) .Marland Kitchen.. 1 By Mouth Once Daily 10)  Ultram 50 Mg Tabs (Tramadol Hcl) .Marland Kitchen.. 1-2  By Mouth Every 6 Hrs As Needed. 11)  Biotin 10 Mg Tabs (Biotin) .... Once Daily 12)  Multivitamins  Tabs (Multiple Vitamin) .... Once Daily 13)  Clotrimazole-Betamethasone 1-0.05 % Crea (Clotrimazole-Betamethasone) .... Apply Bid 14)  Klonopin 0.5  Mg Tabs (Clonazepam) .Marland Kitchen.. 1 By Mouth Two Times A Day 15)  Rozerem 8 Mg Tabs (Ramelteon) .Marland Kitchen.. 1 By Mouth At Bedtime 16)  Hydrochlorothiazide 25 Mg Tabs (Hydrochlorothiazide) .... By Mouth Once Daily 17)  Colcrys 0.6 Mg Tabs (Colchicine) .... As Needed 18)  Trazadone .Marland Kitchen.. 1 By Mouth At Bedtime 19)  Colestid 1 Gm Tabs (Colestipol Hcl) .... Take One By Mouth Once Daily 20)  Amlodipine Besylate 5 Mg Tabs (Amlodipine Besylate) .Marland Kitchen.. 1 By Mouth Once Daily  Current Medications (verified): 1)  Nexium 40 Mg Cpdr (Esomeprazole Magnesium) .... Take 1 By Mouth  Once Daily 2)  Allegra Allergy 180 Mg Tabs (Fexofenadine Hcl) .Marland Kitchen.. 1 By Mouth Once Daily 3)  Nasonex 50 Mcg/act  Susp (Mometasone Furoate) .... 2 Sprays Each Nostril Once Daily 4)  Benicar 20 Mg Tabs (Olmesartan Medoxomil) .Marland Kitchen.. 1 By Mouth Once Daily 5)  Glucophage Xr 500 Mg Xr24h-Tab (Metformin Hcl) .Marland Kitchen.. 1 By Mouth At Bedtime 6)  Synthroid 125 Mcg Tabs (Levothyroxine Sodium) .Marland Kitchen.. 1 By Mouth Once Daily 7)  Fosamax 70 Mg Tabs (Alendronate Sodium) .Marland Kitchen.. 1 By Mouth Once Weekly. 8)  Allopurinol 300 Mg Tabs (Allopurinol) .Marland Kitchen.. 1 By Mouth Once Daily 9)  Celebrex 200 Mg Caps (Celecoxib) .Marland Kitchen.. 1 By Mouth Once Daily 10)  Ultram 50 Mg Tabs (Tramadol Hcl) .Marland Kitchen.. 1-2  By Mouth Every 6 Hrs As Needed. 11)  Biotin 10 Mg Tabs (Biotin) .... Once Daily 12)  Multivitamins  Tabs (Multiple Vitamin) .... Once Daily 13)  Clotrimazole-Betamethasone 1-0.05 % Crea (Clotrimazole-Betamethasone) .... Apply Bid 14)  Klonopin 0.5 Mg Tabs (Clonazepam) .Marland Kitchen.. 1 By Mouth Two Times A Day 15)  Rozerem 8 Mg Tabs (Ramelteon) .Marland Kitchen.. 1 By Mouth At Bedtime 16)  Hydrochlorothiazide 25 Mg Tabs (Hydrochlorothiazide) .... By Mouth Once Daily 17)  Colcrys 0.6 Mg Tabs (Colchicine) .... As Needed 18)  Trazadone .Marland Kitchen.. 1 By Mouth At Bedtime 19)  Colestid 1 Gm Tabs (Colestipol Hcl) .... Take One By Mouth Once Daily 20)  Amlodipine Besylate 5 Mg Tabs (Amlodipine Besylate) .Marland Kitchen.. 1 By Mouth Once Daily 21)  Valtrex 1 Gm Tabs (Valacyclovir Hcl) .Marland Kitchen.. 1 By Mouth Three Times A Day  Allergies (verified): 1)  ! Codeine  Past History:  Past Medical History: Last updated: 01/06/2010 Obsessive compulsive disorder GERD Hiatal hernia Candidal esophagitis Diverticulosis Colon polyps Microscopic colitis 2nd to NSAID use Hypertension Hyperlipidemia Diabetes mellitus, type II Hypothyroidism Chronic rhinitis Gout Osteoporosis Insomnia Encephalopathy 2009 with ?viral menigitis Ataxia Vertigo Shingles Obstructive sleep apnea      - PSG 09/05/09 AHI  14.2      - CPAP 9 cm  Past Surgical History: Last updated: 08/07/2009 Carpal Tunnel-2005 sinusitis signs ---sinus surgery in 2000 Colonoscopy-1991 L3-L4 laminectomy-12/24/2005 Colon - 04/26/2006  Family History: Last updated: 12/31/2008 Family History Depression Family History Diabetes 1st degree relative Family History Other cancer-Breast, Colon, Kidney M--alz dementia--died 2008-- 72yo  Social History: Last updated: 07/14/2006 Never Smoked Alcohol use-no Drug use-no Regular exercise-yes  Risk Factors: Alcohol Use: 0 (08/07/2009) Caffeine Use: 0 (12/31/2008) Exercise: no (12/31/2008)  Risk Factors: Smoking Status: never (08/07/2009)  Family History: Reviewed history from 12/31/2008 and no changes required. Family History Depression Family History Diabetes 1st degree relative Family History Other cancer-Breast, Colon, Kidney M--alz dementia--died 2008-- 72yo  Social History: Reviewed history from 07/14/2006 and no changes required. Never Smoked Alcohol use-no Drug use-no Regular exercise-yes  Review of Systems      See HPI  Physical Exam  General:  Well-developed,well-nourished,in no acute distress;  alert,appropriate and cooperative throughout examination Genitalia:  + vesicles labia  no vaginal discharge.   Psych:  Oriented X3 and normally interactive.     Impression & Recommendations:  Problem # 1:  GENITAL HERPES (ICD-054.10)  valtrex for 10 days rto as needed   Orders: Prescription Created Electronically [id number])  Complete Medication List: 1)  Nexium 40 Mg Cpdr (Esomeprazole magnesium) .... Take 1 by mouth once daily 2)  Allegra Allergy 180 Mg Tabs (Fexofenadine hcl) .Marland Kitchen.. 1 by mouth once daily 3)  Nasonex 50 Mcg/act Susp (Mometasone furoate) .... 2 sprays each nostril once daily 4)  Benicar 20 Mg Tabs (Olmesartan medoxomil) .Marland Kitchen.. 1 by mouth once daily 5)  Glucophage Xr 500 Mg Xr24h-tab (Metformin hcl) .Marland Kitchen.. 1 by mouth at bedtime 6)  Synthroid  125 Mcg Tabs (Levothyroxine sodium) .Marland Kitchen.. 1 by mouth once daily 7)  Fosamax 70 Mg Tabs (Alendronate sodium) .Marland Kitchen.. 1 by mouth once weekly. 8)  Allopurinol 300 Mg Tabs (Allopurinol) .Marland Kitchen.. 1 by mouth once daily 9)  Celebrex 200 Mg Caps (Celecoxib) .Marland Kitchen.. 1 by mouth once daily 10)  Ultram 50 Mg Tabs (Tramadol hcl) .Marland Kitchen.. 1-2  by mouth every 6 hrs as needed. 11)  Biotin 10 Mg Tabs (Biotin) .... Once daily 12)  Multivitamins Tabs (Multiple vitamin) .... Once daily 13)  Clotrimazole-betamethasone 1-0.05 % Crea (Clotrimazole-betamethasone) .... Apply bid 14)  Klonopin 0.5 Mg Tabs (Clonazepam) .Marland Kitchen.. 1 by mouth two times a day 15)  Rozerem 8 Mg Tabs (Ramelteon) .Marland Kitchen.. 1 by mouth at bedtime 16)  Hydrochlorothiazide 25 Mg Tabs (Hydrochlorothiazide) .... By mouth once daily 17)  Colcrys 0.6 Mg Tabs (Colchicine) .... As needed 18)  Trazadone  .Marland KitchenMarland Kitchen. 1 by mouth at bedtime 19)  Colestid 1 Gm Tabs (Colestipol hcl) .... Take one by mouth once daily 20)  Amlodipine Besylate 5 Mg Tabs (Amlodipine besylate) .Marland Kitchen.. 1 by mouth once daily 21)  Valtrex 1 Gm Tabs (Valacyclovir hcl) .Marland Kitchen.. 1 by mouth three times a day Prescriptions: VALTREX 1 GM TABS (VALACYCLOVIR HCL) 1 by mouth three times a day  #30 x 2   Entered and Authorized by:   Loreen Freud DO   Signed by:   Loreen Freud DO on 03/10/2010   Method used:   Electronically to        [id number] ([facility]       [address]       [place], [place]  [address]       Ph: [phone] or [phone]       Fax: [phone]   RxID:   [id number]    Orders Added: 1)  Est. Patient Level III [47425] 2)  Prescription Created Electronically [phone]

## 2010-03-19 NOTE — Progress Notes (Signed)
Summary: refill  Phone Note Refill Request Call back at [phone] Message from:  Patient dauhgter  Refills Requested: Medication #1:  KLONOPIN 0.5 MG TABS 1 by mouth two times a day last filled 11-08-09 #60 1, last ov 03-10-10.[facility]. [facility] [address]...Marland KitchenMarland KitchenFelecia Deloach CMA  March 10, 2010 4:32 PM   Initial call taken by: Jeremy Johann CMA,  March 10, 2010 4:32 PM  Follow-up for Phone Call        refill x1 Follow-up by: Loreen Freud DO,  March 10, 2010 5:01 PM    Prescriptions: KLONOPIN 0.5 MG TABS (CLONAZEPAM) 1 by mouth two times a day  #60 x 1   Entered by:   Doristine Devoid CMA   Authorized by:   Loreen Freud DO   Signed by:   Doristine Devoid CMA on 03/11/2010   Method used:   Printed then faxed to ...       [id number] ([facility]       [address]       [place], [place]  [address]       Ph: [phone] or [phone]       Fax: [phone]   RxID:   [id number]

## 2010-03-27 NOTE — Medication Information (Signed)
Summary: Order for CPAP Supplies/[facility]  Order for CPAP Supplies/[facility]   Imported By: Maryln Gottron 03/21/2010 15:38:44  _____________________________________________________________________  External Attachment:    Type:   Image     Comment:   External Document

## 2010-04-04 ENCOUNTER — Other Ambulatory Visit: Payer: Self-pay | Admitting: Family Medicine

## 2010-04-04 ENCOUNTER — Other Ambulatory Visit (INDEPENDENT_AMBULATORY_CARE_PROVIDER_SITE_OTHER): Payer: Medicare Other

## 2010-04-04 ENCOUNTER — Encounter (INDEPENDENT_AMBULATORY_CARE_PROVIDER_SITE_OTHER): Payer: Self-pay | Admitting: *Deleted

## 2010-04-04 DIAGNOSIS — E119 Type 2 diabetes mellitus without complications: Secondary | ICD-10-CM

## 2010-04-04 DIAGNOSIS — E039 Hypothyroidism, unspecified: Secondary | ICD-10-CM

## 2010-04-04 DIAGNOSIS — E785 Hyperlipidemia, unspecified: Secondary | ICD-10-CM

## 2010-04-04 LAB — LIPID PANEL
HDL: 48.9 mg/dL (ref >39.00)
Total CHOL/HDL Ratio: 6
Triglycerides: 249 mg/dL — ABNORMAL HIGH (ref 0.0–149.0)

## 2010-04-04 LAB — BASIC METABOLIC PANEL
CO2: 26 mEq/L (ref 19–32)
GFR: 69.88 mL/min (ref >60.00)
Glucose, Bld: 145 mg/dL — ABNORMAL HIGH (ref 70–99)
Potassium: 3.8 mEq/L (ref 3.5–5.1)
Sodium: 139 mEq/L (ref 135–145)

## 2010-04-04 LAB — MICROALBUMIN / CREATININE URINE RATIO
Creatinine,U: 147.7 mg/dL
Microalb Creat Ratio: 2.7 mg/g (ref 0.0–30.0)
Microalb, Ur: 4 mg/dL — ABNORMAL HIGH (ref 0.0–1.9)

## 2010-04-04 LAB — TSH: TSH: 2.25 u[IU]/mL (ref 0.35–5.50)

## 2010-04-04 LAB — HEPATIC FUNCTION PANEL
ALT: 29 U/L (ref 0–35)
Albumin: 4.2 g/dL (ref 3.5–5.2)
Total Bilirubin: 0.7 mg/dL (ref 0.3–1.2)

## 2010-04-09 ENCOUNTER — Telehealth: Payer: Self-pay | Admitting: Family Medicine

## 2010-04-11 ENCOUNTER — Telehealth (INDEPENDENT_AMBULATORY_CARE_PROVIDER_SITE_OTHER): Payer: Self-pay | Admitting: *Deleted

## 2010-04-17 NOTE — Progress Notes (Signed)
Summary: DME request  Phone Note Call from Patient   Caller: Daughter--Mrs Weston Anna # [phone] or [phone] Summary of Call: Patient daughter called noting that she has secured the patient an [facility] [address]. She notes that the patient has terrible balance and will not be able to push or pull her items to the laundry room. She needs an prescription for a cart that doubles as a walker that will enable her to take her own items to the laundry room or downstairs in the elevator. Patient will not be able to do it without it, due to not being able to use her cane in these instances.   Does this require office visit or it is ok to enter DME? Please advise.  Initial call taken by: Lucious Groves CMA,  April 09, 2010 1:54 PM  Follow-up for Phone Call        ok to order dme but if paperwork needs to be filled out for Assisted Living she will need ov. Follow-up by: Loreen Freud DO,  April 09, 2010 2:42 PM  Additional Follow-up for Phone Call Additional follow up Details #1::        I made daughter aware of above, Rx left at check in.... Almeta Monas CMA Duncan Dull)  April 09, 2010 5:03 PM     New/Updated Medications: HUGO ROLLING WALKER  MISC (MISC. DEVICES) use as needed Prescriptions: HUGO ROLLING WALKER  MISC (MISC. DEVICES) use as needed  #1 x 0   Entered by:   Almeta Monas CMA (AAMA)   Authorized by:   Loreen Freud DO   Signed by:   Almeta Monas CMA (AAMA) on 04/09/2010   Method used:   Print then Give to Patient   RxID:   [id number]

## 2010-04-17 NOTE — Progress Notes (Signed)
Summary: Return CPAP  Phone Note Call from Patient Call back at Home Phone [phone] Call back at [phone]   Caller: Patient Summary of Call: Patient needs prescription to return CPAP to [facility] home health.  Hasn't been using because it will not stay together.  Please call patient. Initial call taken by: Leonette Monarch,  April 11, 2010 9:03 AM  Follow-up for Phone Call        Pt continues to have trouble with mask.  Pt reports that she has been to [facility] 3 times to have mask worked on and she continues to have problems.  PT has not used CPAP in last 2 weeks and she said she is sleeping better because she is not having to deal with the mask.  Pt  does not feel like trying to use CPAP anymore. Abigail Miyamoto RN  April 11, 2010 9:23 AM   Additional Follow-up for Phone Call Additional follow up Details #1::        Please schedule for ROV to discuss options for treating her sleep apnea. Additional Follow-up by: Coralyn Helling MD,  April 11, 2010 4:44 PM    Additional Follow-up for Phone Call Additional follow up Details #2::    Spoke with pt and sched appt with [facility] for 04/30/10 at 12 noon.  Follow-up by: Vernie Murders,  April 11, 2010 4:49 PM

## 2010-04-18 ENCOUNTER — Encounter: Payer: Self-pay | Admitting: Pulmonary Disease

## 2010-04-21 ENCOUNTER — Ambulatory Visit (INDEPENDENT_AMBULATORY_CARE_PROVIDER_SITE_OTHER): Payer: Medicare Other

## 2010-04-21 ENCOUNTER — Encounter: Payer: Self-pay | Admitting: Family Medicine

## 2010-04-21 DIAGNOSIS — E785 Hyperlipidemia, unspecified: Secondary | ICD-10-CM

## 2010-04-21 DIAGNOSIS — Z79899 Other long term (current) drug therapy: Secondary | ICD-10-CM

## 2010-04-26 ENCOUNTER — Encounter: Payer: Self-pay | Admitting: Family Medicine

## 2010-04-28 ENCOUNTER — Telehealth: Payer: Self-pay | Admitting: Family Medicine

## 2010-04-28 LAB — DIFFERENTIAL
Basophils Absolute: 0 10*3/uL (ref 0.0–0.1)
Basophils Relative: 0 % (ref 0–1)
Eosinophils Absolute: 0.2 10*3/uL (ref 0.0–0.7)
Eosinophils Relative: 2 % (ref 0–5)
Lymphocytes Relative: 27 % (ref 12–46)
Lymphs Abs: 3.2 K/uL (ref 0.7–4.0)
Monocytes Absolute: 0.9 K/uL (ref 0.1–1.0)
Monocytes Relative: 8 % (ref 3–12)
Neutro Abs: 7.3 10*3/uL (ref 1.7–7.7)
Neutrophils Relative %: 63 % (ref 43–77)

## 2010-04-28 LAB — CBC
HCT: 43.8 % (ref 36.0–46.0)
Hemoglobin: 15 g/dL (ref 12.0–15.0)
MCHC: 34.3 g/dL (ref 30.0–36.0)
MCV: 95.7 fL (ref 78.0–100.0)
MCV: 96.1 fL (ref 78.0–100.0)
Platelets: 244 10*3/uL (ref 150–400)
Platelets: 303 10*3/uL (ref 150–400)
RBC: 4.58 MIL/uL (ref 3.87–5.11)
RDW: 13.4 % (ref 11.5–15.5)
WBC: 11.7 K/uL — ABNORMAL HIGH (ref 4.0–10.5)
WBC: 9.1 10*3/uL (ref 4.0–10.5)

## 2010-04-28 LAB — URINE MICROSCOPIC-ADD ON

## 2010-04-28 LAB — BASIC METABOLIC PANEL WITH GFR
GFR calc Af Amer: >60 mL/min (ref >60)
GFR calc non Af Amer: 53 mL/min — ABNORMAL LOW (ref >60)
Potassium: 4.6 meq/L (ref 3.5–5.1)
Sodium: 138 meq/L (ref 135–145)

## 2010-04-28 LAB — URINALYSIS, ROUTINE W REFLEX MICROSCOPIC
Bilirubin Urine: NEGATIVE
Glucose, UA: NEGATIVE mg/dL
Hgb urine dipstick: NEGATIVE
Ketones, ur: NEGATIVE mg/dL
Nitrite: NEGATIVE
Protein, ur: 30 mg/dL — AB
Specific Gravity, Urine: 1.018 (ref 1.005–1.030)
Urobilinogen, UA: 0.2 mg/dL (ref 0.0–1.0)
pH: 6 (ref 5.0–8.0)

## 2010-04-28 LAB — TSH: TSH: 0.018 u[IU]/mL — ABNORMAL LOW (ref 0.350–4.500)

## 2010-04-28 LAB — BASIC METABOLIC PANEL
BUN: 14 mg/dL (ref 6–23)
BUN: 17 mg/dL (ref 6–23)
CO2: 30 mEq/L (ref 19–32)
Calcium: 9.9 mg/dL (ref 8.4–10.5)
Chloride: 100 mEq/L (ref 96–112)
Creatinine, Ser: 0.87 mg/dL (ref 0.4–1.2)
Creatinine, Ser: 1.03 mg/dL (ref 0.4–1.2)
GFR calc non Af Amer: >60 mL/min (ref >60)
Glucose, Bld: 111 mg/dL — ABNORMAL HIGH (ref 70–99)

## 2010-04-28 LAB — PROTIME-INR
INR: 1.07 (ref 0.00–1.49)
Prothrombin Time: 13.8 s (ref 11.6–15.2)

## 2010-04-28 LAB — APTT: aPTT: 29 s (ref 24–37)

## 2010-04-28 LAB — POCT CARDIAC MARKERS
Myoglobin, poc: 68.7 ng/mL (ref 12–200)
Troponin i, poc: <0.05 ng/mL (ref 0.00–0.09)

## 2010-04-28 LAB — D-DIMER, QUANTITATIVE: D-Dimer, Quant: 0.39 ug{FEU}/mL (ref 0.00–0.48)

## 2010-04-29 ENCOUNTER — Telehealth: Payer: Self-pay | Admitting: Internal Medicine

## 2010-04-29 NOTE — Assessment & Plan Note (Signed)
Summary: new /lipid per dr lowne/mt   Primary Provider:  Loreen Freud, DO   History of Present Illness: [name] presented to clinic with cane and was slightly confused why she was set up to see [place]. I clarified the reason was for her lipid control and she was ok with that. In her past she has taken crestor and lipitor, but is currently on colestipol only. She does seem to have some baseline confusion, but states she takes all of her meds consistently.   SEEN BY: Samantha Crimes, PharmD  Allergies: 1)  ! Codeine  Vital Signs:  Patient profile:   70 year old female Weight:      231 pounds Pulse rate:   75 / minute BP sitting:   122 / 82  (right arm)   Impression & Recommendations:  Problem # 1:  HYPERLIPIDEMIA (ICD-272.4) Latest lipid profile Total Chol: 272 TG: 249 HDL: 48.9 LDL: 204.1  [name] has taken lipitor and crestor in the past, but was stopped in 8/11 2/2 to pt stating she was not taking this medication. No other notes or contraindications were noted for statin therapy so I will restart lipitor 10 mg daily and re-check lipids in 6 weeks.   Her diet seems to be ok, but she does seem to eat large less frequent meals. I instructed her to try and eat more frequent smaller meals. This should help not only with her cholesterol, but also her diabetes. Exercise is for the most part non-existent. Will try our best to get her to begin walking or at least exercising some at home with soup cans. She does enjoy swimming so she stated she may look into trying to do some of that.    Her updated medication list for this problem includes:    Colestid 1 Gm Tabs (Colestipol hcl) .Marland Kitchen... Take one by mouth once daily    Lipitor 10 Mg Tabs (Atorvastatin calcium) .Marland Kitchen... Take one tablet at bedtime*** New medication added today  Patient Instructions: 1)  Lab appt (fasting): [address] location: 06/02/10 @ 9 am 2)  Lipid clinic appt: 4/30 @ 3:30 pm 3)  Start taking new medication  lipitor every night and try to spread your meals out throughout the day. Prescriptions: LIPITOR 10 MG TABS (ATORVASTATIN CALCIUM) take one tablet at bedtime  #30 x 3   Entered by:   Samantha Crimes PharmD   Authorized by:   Ronaldo Miyamoto, MD, [facility]   Signed by:   Samantha Crimes PharmD on 04/21/2010   Method used:   Electronically to        [id number]       [address]       [place], [place]  [address]       Ph: [phone] or [phone]       Fax: [phone]   RxID:   [id number]

## 2010-04-29 NOTE — Telephone Encounter (Signed)
error 

## 2010-04-30 ENCOUNTER — Ambulatory Visit: Payer: Medicare Other | Admitting: Pulmonary Disease

## 2010-05-01 ENCOUNTER — Emergency Department (HOSPITAL_BASED_OUTPATIENT_CLINIC_OR_DEPARTMENT_OTHER)
Admission: EM | Admit: 2010-05-01 | Discharge: 2010-05-01 | Disposition: A | Discharge status: Home / Self Care | Payer: Medicare Other | Attending: Emergency Medicine | Admitting: Emergency Medicine

## 2010-05-01 DIAGNOSIS — S139XXA Sprain of joints and ligaments of unspecified parts of neck, initial encounter: Secondary | ICD-10-CM | POA: Insufficient documentation

## 2010-05-01 DIAGNOSIS — Y92410 Unspecified street and highway as the place of occurrence of the external cause: Secondary | ICD-10-CM | POA: Insufficient documentation

## 2010-05-01 DIAGNOSIS — M79609 Pain in unspecified limb: Secondary | ICD-10-CM | POA: Insufficient documentation

## 2010-05-01 DIAGNOSIS — M7989 Other specified soft tissue disorders: Secondary | ICD-10-CM | POA: Insufficient documentation

## 2010-05-01 DIAGNOSIS — E785 Hyperlipidemia, unspecified: Secondary | ICD-10-CM | POA: Insufficient documentation

## 2010-05-01 DIAGNOSIS — Z79899 Other long term (current) drug therapy: Secondary | ICD-10-CM | POA: Insufficient documentation

## 2010-05-01 DIAGNOSIS — E119 Type 2 diabetes mellitus without complications: Secondary | ICD-10-CM | POA: Insufficient documentation

## 2010-05-01 DIAGNOSIS — I10 Essential (primary) hypertension: Secondary | ICD-10-CM | POA: Insufficient documentation

## 2010-05-01 LAB — URINALYSIS, ROUTINE W REFLEX MICROSCOPIC
Ketones, ur: NEGATIVE mg/dL
Nitrite: NEGATIVE
Specific Gravity, Urine: 1.025 (ref 1.005–1.030)
Urobilinogen, UA: 1 mg/dL (ref 0.0–1.0)
pH: 8 (ref 5.0–8.0)

## 2010-05-01 LAB — URINE MICROSCOPIC-ADD ON

## 2010-05-02 ENCOUNTER — Other Ambulatory Visit: Payer: Self-pay | Admitting: Family Medicine

## 2010-05-02 ENCOUNTER — Ambulatory Visit (INDEPENDENT_AMBULATORY_CARE_PROVIDER_SITE_OTHER): Payer: Medicare Other | Admitting: Family Medicine

## 2010-05-02 ENCOUNTER — Encounter: Payer: Self-pay | Admitting: Family Medicine

## 2010-05-02 VITALS — BP 130/80 | HR 100 | Wt 222.6 lb

## 2010-05-02 DIAGNOSIS — N390 Urinary tract infection, site not specified: Secondary | ICD-10-CM | POA: Insufficient documentation

## 2010-05-02 DIAGNOSIS — W19XXXA Unspecified fall, initial encounter: Secondary | ICD-10-CM

## 2010-05-02 DIAGNOSIS — R279 Unspecified lack of coordination: Secondary | ICD-10-CM

## 2010-05-02 DIAGNOSIS — F29 Unspecified psychosis not due to a substance or known physiological condition: Secondary | ICD-10-CM

## 2010-05-02 DIAGNOSIS — E119 Type 2 diabetes mellitus without complications: Secondary | ICD-10-CM

## 2010-05-02 DIAGNOSIS — R296 Repeated falls: Secondary | ICD-10-CM

## 2010-05-02 DIAGNOSIS — F4489 Other dissociative and conversion disorders: Secondary | ICD-10-CM | POA: Insufficient documentation

## 2010-05-02 DIAGNOSIS — E039 Hypothyroidism, unspecified: Secondary | ICD-10-CM

## 2010-05-02 LAB — CBC WITH DIFFERENTIAL/PLATELET
Basophils Absolute: 0 10*3/uL (ref 0.0–0.1)
Basophils Relative: 0 % (ref 0–1)
Eosinophils Absolute: 0.2 10*3/uL (ref 0.0–0.7)
Eosinophils Relative: 2 % (ref 0–5)
HCT: 43.4 % (ref 36.0–46.0)
MCH: 32.2 pg (ref 26.0–34.0)
MCHC: 35 g/dL (ref 30.0–36.0)
Monocytes Absolute: 0.9 10*3/uL (ref 0.1–1.0)
Neutro Abs: 5.4 10*3/uL (ref 1.7–7.7)
RDW: 13.4 % (ref 11.5–15.5)

## 2010-05-02 LAB — TSH: TSH: 19.816 u[IU]/mL — ABNORMAL HIGH (ref 0.350–4.500)
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
## 2010-05-03 LAB — HEPATIC FUNCTION PANEL
AST: 34 U/L (ref 0–37)
Albumin: 4.6 g/dL (ref 3.5–5.2)

## 2010-05-03 LAB — BASIC METABOLIC PANEL
BUN: 18 mg/dL (ref 6–23)
Calcium: 8 mg/dL — ABNORMAL LOW (ref 8.4–10.5)
Creat: 1.01 mg/dL (ref 0.40–1.20)
Glucose, Bld: 105 mg/dL — ABNORMAL HIGH (ref 70–99)
Potassium: >7.5 mEq/L (ref 3.5–5.3)

## 2010-05-03 LAB — HEMOGLOBIN A1C
Hgb A1c MFr Bld: 6.5 % — ABNORMAL HIGH (ref <5.7)
Mean Plasma Glucose: 140 mg/dL — ABNORMAL HIGH (ref <117)

## 2010-05-03 LAB — LIPID PANEL
Cholesterol: 175 mg/dL (ref 0–200)
Triglycerides: 194 mg/dL — ABNORMAL HIGH (ref <150)

## 2010-05-05 ENCOUNTER — Telehealth: Payer: Self-pay

## 2010-05-05 ENCOUNTER — Other Ambulatory Visit (INDEPENDENT_AMBULATORY_CARE_PROVIDER_SITE_OTHER): Payer: Medicare Other

## 2010-05-05 DIAGNOSIS — E785 Hyperlipidemia, unspecified: Secondary | ICD-10-CM

## 2010-05-05 LAB — BASIC METABOLIC PANEL
Calcium: 9.9 mg/dL (ref 8.4–10.5)
GFR: 57.26 mL/min — ABNORMAL LOW (ref >60.00)
Glucose, Bld: 138 mg/dL — ABNORMAL HIGH (ref 70–99)
Sodium: 138 mEq/L (ref 135–145)

## 2010-05-05 NOTE — Telephone Encounter (Signed)
Spoke with [place] and she is aware of the results. She stated the patient is still having blood in her Urine and the back pain and wanted to know if this is normal... The patient does not currently see a urologist and Zenaida Niece is concerned about her mothers discomfort..... Please advise KP

## 2010-05-05 NOTE — Progress Notes (Signed)
Spoke with [name] and she is aware of the results. She stated the patient is still having blood in her Urine and the back pain and wanted to know if this is normal... The patient does not currently see a urologist and [name] is concerned about her mothers discomfort..... Please advise KP  

## 2010-05-05 NOTE — Progress Notes (Signed)
Spoke with Joelynn(daughter) and advised if the results, she stated she had a repeat potassium done at [facility] on Saturday and was not given results... I checked E-chart no results, I advised to have patient come in to recheck today. She stated she will call [employer] because if she misses anymore time she will lose her job. I voiced understanding, I made her aware of all other results and advise to be sure to take Thyroid meds..She agreed. She will call me back and advise of the time the patient will come in to have potassium rechecked today..... KP

## 2010-05-05 NOTE — Telephone Encounter (Signed)
Message copied by Almeta Monas on Mon May 05, 2010  5:17 PM ------      Message from: Loreen Freud      Created: Mon May 05, 2010  3:28 PM       Potassium is fine---I think what happened is it sat too long and hemolyzed

## 2010-05-06 ENCOUNTER — Encounter: Payer: Self-pay | Admitting: *Deleted

## 2010-05-06 NOTE — Telephone Encounter (Signed)
If I knew that yesterday we could have check her urine too.  If she can come in we can check it and make appropriate referral.  Urine on 22nd had no blood.

## 2010-05-06 NOTE — Telephone Encounter (Signed)
Per Christella Noa for Urology referral      KP

## 2010-05-08 NOTE — Progress Notes (Signed)
Summary: rx issues  Phone Note Call from Patient   Caller: Mrs. Edwinna Areola   [phone] Summary of Call: Patient daughter called noting that she thinks the patient needs prescription for walker with seat and prescription for shower seat. She notes that the patient is not doing well with a cane. She also notes that she is having trouble with Syntroid and pharmacy recommends patient have labs done. TSH was last done 04/04/10. Please advise. Initial call taken by: Lucious Groves CMA,  April 28, 2010 4:02 PM  Follow-up for Phone Call        pt needs ov ---30 min visit fasting ok to send rx for shower seat and walker Follow-up by: Loreen Freud DO,  April 28, 2010 4:59 PM  Additional Follow-up for Phone Call Additional follow up Details #1::        Mrs. Weston Anna aware of the above.Marland [facility]Rx [facility], will call back to schedule appt after checking her schedule. Additional Follow-up by: Almeta Monas CMA Duncan Dull),  April 29, 2010 8:47 AM    New/Updated Medications: HUGO ROLLING WALKER ELITE  MISC (MISC. DEVICES) as directed * SHOWER SEAT as directed Prescriptions: SHOWER SEAT as directed  #1 x 0   Entered by:   Almeta Monas CMA (AAMA)   Authorized by:   Loreen Freud DO   Signed by:   Almeta Monas CMA (AAMA) on 04/29/2010   Method used:   Faxed to ...       [id number]       [address]       [place], [place]  [address]       Ph: [phone] or [phone]       Fax: [phone]   RxID:   [id number] HUGO ROLLING WALKER ELITE  MISC (MISC. DEVICES) as directed  #1 x 0   Entered by:   Almeta Monas CMA (AAMA)   Authorized by:   Loreen Freud DO   Signed by:   Almeta Monas CMA (AAMA) on 04/29/2010   Method used:   Faxed to ...       [id number]       [address]       [place], [place]  [address]       Ph: [phone] or [phone]       Fax: [phone]   RxID:   [id number]   Appended Document: rx  issues    Prescriptions: HUGO ROLLING WALKER ELITE  MISC (MISC. DEVICES) as directed  #1 x 0   Entered by:   Almeta Monas CMA (AAMA)   Authorized by:   Loreen Freud DO   Signed by:   Almeta Monas CMA (AAMA) on 04/29/2010   Method used:   Print then Give to Patient   RxID:   [id number] SHOWER SEAT as directed  #1 x 0   Entered by:   Almeta Monas CMA (AAMA)   Authorized by:   Loreen Freud DO   Signed by:   Almeta Monas CMA (AAMA) on 04/29/2010   Method used:   Print then Give to Patient   RxID:   [id number]

## 2010-05-09 ENCOUNTER — Telehealth: Payer: Self-pay | Admitting: *Deleted

## 2010-05-09 MED ORDER — CLOTRIMAZOLE-BETAMETHASONE 1-0.05 % EX CREA: TOPICAL_CREAM | Freq: Two times a day (BID) | CUTANEOUS | Status: DC

## 2010-05-09 MED ORDER — ALLOPURINOL 300 MG PO TABS: 300.0000 mg | ORAL_TABLET | Freq: Every day | ORAL | Status: DC

## 2010-05-09 NOTE — Telephone Encounter (Signed)
Daughter aware They never rcv'd Referral information so I re-faxed the documents to [facility] health care , refills sent to pharmacy   KP

## 2010-05-09 NOTE — Telephone Encounter (Signed)
Pt daughter would like to know status of home care referral.

## 2010-05-09 NOTE — Telephone Encounter (Signed)
Addended by: Almeta Monas on: 05/09/2010 03:31 PM   Modules accepted: Orders

## 2010-05-13 ENCOUNTER — Other Ambulatory Visit: Payer: Self-pay | Admitting: *Deleted

## 2010-05-13 MED ORDER — CLONAZEPAM 0.5 MG PO TABS: 0.5000 mg | ORAL_TABLET | Freq: Two times a day (BID) | ORAL | Status: DC

## 2010-05-13 NOTE — Telephone Encounter (Signed)
Rx faxed

## 2010-05-13 NOTE — Telephone Encounter (Signed)
If for Klonopin ok to refill

## 2010-05-13 NOTE — Telephone Encounter (Signed)
Last OV 05-02-10, last filled 03-11-10 #60 1

## 2010-06-02 ENCOUNTER — Other Ambulatory Visit: Payer: Medicare Other | Admitting: *Deleted

## 2010-06-04 ENCOUNTER — Other Ambulatory Visit (INDEPENDENT_AMBULATORY_CARE_PROVIDER_SITE_OTHER): Payer: Medicare Other | Admitting: *Deleted

## 2010-06-04 DIAGNOSIS — E7800 Pure hypercholesterolemia, unspecified: Secondary | ICD-10-CM

## 2010-06-04 DIAGNOSIS — Z79899 Other long term (current) drug therapy: Secondary | ICD-10-CM

## 2010-06-04 LAB — HEPATIC FUNCTION PANEL
ALT: 22 U/L (ref 0–35)
Bilirubin, Direct: 0.1 mg/dL (ref 0.0–0.3)
Total Protein: 7.2 g/dL (ref 6.0–8.3)

## 2010-06-04 LAB — LIPID PANEL: HDL: 62.7 mg/dL (ref >39.00)

## 2010-06-09 ENCOUNTER — Ambulatory Visit: Payer: Medicare Other

## 2010-06-11 ENCOUNTER — Other Ambulatory Visit: Payer: Self-pay

## 2010-06-11 ENCOUNTER — Other Ambulatory Visit: Payer: Self-pay | Admitting: Family Medicine

## 2010-06-11 MED ORDER — CLONAZEPAM 0.5 MG PO TABS: 0.5000 mg | ORAL_TABLET | Freq: Two times a day (BID) | ORAL | Status: DC

## 2010-06-11 NOTE — Telephone Encounter (Signed)
Last seen 04/21/10 and filled 05/13/10 please advise     KP

## 2010-06-11 NOTE — Telephone Encounter (Signed)
Faxed.   KP 

## 2010-06-12 NOTE — Telephone Encounter (Signed)
Rx faxed.    KP 

## 2010-06-12 NOTE — Telephone Encounter (Signed)
Last seen 05/02/10 and filled 04/14/10 #60 with 1 refill   Please advise    KP

## 2010-06-13 ENCOUNTER — Telehealth: Payer: Self-pay | Admitting: *Deleted

## 2010-06-13 NOTE — Telephone Encounter (Signed)
Message copied by Lisabeth Devoid on Fri Jun 13, 2010  1:11 PM ------      Message from: [place]      Created: Thu Jun 12, 2010  9:14 AM       Decrease carbs, otherwise ok

## 2010-06-13 NOTE — Telephone Encounter (Signed)
Pt aware of lab results and recommendations. Debbie Seven Marengo RN  

## 2010-06-20 ENCOUNTER — Telehealth: Payer: Self-pay | Admitting: *Deleted

## 2010-06-20 NOTE — Telephone Encounter (Addendum)
FYI FYI FYI  Nurse called to report that Pt has been c/o dizziness. Nurse note that Pt is suppose to having pending appt today and would like for Dr Laury Axon to review her meds for any that may be counteracting with each other . Pt is on at least 18 med according to their record. Pt is taking a med for DM but does not check BS at all.   Pt does not have appt until 07-04-10., Left message to call office to inform nurse.

## 2010-06-20 NOTE — Telephone Encounter (Signed)
Could be Bp or glucose---pt is supposed to be checking glucose bid  Was her bp ok?

## 2010-06-23 NOTE — Telephone Encounter (Signed)
Spoke with nurse Pt BP is fine. Per nurse will advise/instruct Pt on checking and monitoring BS. Nurse will monitor Pt as well for any cognitive changes as well.

## 2010-06-24 NOTE — Procedures (Signed)
EEG NUMBER:  [id number].   HISTORY:  This is a 70 year old patient with altered mental status,  headache, and lethargy.  The patient is being evaluated for altered  mental status.  This is a portable EEG recording.   MEDICATIONS:  Zovirax, Ventolin, atenolol, Lovenox, Levaquin, and  Tylenol.   EEG CLASSIFICATION:  Dysrhythmia grade 2, generalized.   DESCRIPTION OF RECORDING:  Background rhythm of this consists of a  somewhat poorly organized background activity of somewhat higher  amplitude of 6 Hz.  As the record progresses, the background slowing is  seen in a persistent nature throughout the recording.  Photic  stimulation and hyperventilation were not performed.  At no time during  the recording, did there appear to be no evidence of actual spikes,  spike wave discharges, or evidence of focal slowing.  EKG monitor shows  no evidence of cardiac rhythm abnormalities with heart rate of 120.   IMPRESSION:  This is an abnormal EEG recording due to generalized theta  frequency slowing.  This study is consistent with generalized neuronal  dysfunction.  This would be seen with a toxic or metabolic  encephalopathy.  No clear epileptiform discharges were seen.      Marlan Palau, M.D.  Electronically Signed     JXB:JYNW  D:  11/14/2007 15:44:20  T:  11/15/2007 03:09:51  Job #:  [id number]

## 2010-06-24 NOTE — H&P (Signed)
NAMETERESA, NICODEMUS            ACCOUNT NO.:  [id number]   MEDICAL RECORD NO.:  [id number]          PATIENT TYPE:  INP   LOCATION:  3112                         FACILITY:  [facility]   PHYSICIAN:  Therisa Doyne, MD    DATE OF BIRTH:  January 06, 1941   DATE OF ADMISSION:  11/13/2007  DATE OF DISCHARGE:                              HISTORY & PHYSICAL   No dictation.      Therisa Doyne, MD     SJT/MEDQ  D:  11/14/2007  T:  11/14/2007  Job:  [id number]

## 2010-06-24 NOTE — Consult Note (Signed)
[name], [name]:  [id number]   MEDICAL RECORD NO.:  [id number]          PATIENT TYPE:  INP   LOCATION:  3112                         FACILITY:  [facility]   PHYSICIAN:  Noel Christmas, MD    DATE OF BIRTH:  [birth year]   DATE OF CONSULTATION:  DATE OF DISCHARGE:                                 CONSULTATION   REFERRING PHYSICIAN:  Valerie A. Felicity Coyer, MD   REASON FOR CONSULTATION:  Altered mental status.   This is a 70 year old lady who was brought to the hospital yesterday by  family members, indicated that she had developed confusion.  She had  difficulty doing simple task at home as well as had speech abnormality  with wrong words coming out in addition to having tendency to  confabulate as well as repeat herself.  She complained of a headache as  well as nausea, vomiting, and diarrhea on November 12, 2007.  CT of her  head was unremarkable with no signs of an acute intracranial  abnormality.  Her exam showed no focal abnormalities.  She had a lumbar  puncture, which showed 2 white cells and 19 red cells.  Protein was  elevated at 100.  Glucose was normal.  Gram negative.  She was started  on Rocephin in the emergency room intravenously.  She was also  subsequently given acyclovir 10 mg IV q.8 h.  CSF cultures pending.  Viral screen was also pending.   Laboratory studies were otherwise unremarkable with no signs of  significant metabolic abnormality.  The patient's mental status has  remained essentially the same since admission.  There is no previous  history of mental status changes of this type.   Past medical history is remarkable for hypertension, hypothyroidism,  GERD, back problems, and genital herpes.   Family history is unavailable.   PHYSICAL EXAMINATION:  GENERAL:  Appearance is that of a late middle-  aged slightly elderly appearing lady and was moderately overweight.  She  was alert and cooperative and in no acute distress.  She had no  problems  following commands.  She was oriented to person and place.  She was  disoriented to day and date.  She made frequent paraphasic errors as  well as had frequent perseveration with verbal answers.  There was no  clear confabulation with my examination.  HEENT:  Her pupils were equal and reactive normally to light.  Extraocular movements were full and conjugate.  Visual fields were  intact and normal.  NEUROLOGIC:  There was no facial numbness.  She had equivocal right  lower facial weakness.  Hearing was normal.  Speech was normal with no  dysarthria.  Palate movement was normal and symmetrical.  Strength with  manual muscle testing was normal throughout.  She had mild right  pronator drift.  Deep tendon reflexes were normal and symmetrical  throughout.  Plantar responses were flexor.  Sensory exam was normal.  Cardiac auscultation was normal.   CLINICAL IMPRESSION:  1. Altered mental status of unclear etiology.  There is no indication      of infectious process per CSF findings so  far.  Etiology for      elevated protein is unclear.  Her mental status changes were      suggestive of expressive aphasia with features of conduction      aphasia as well.  Given the mild right motor changes in addition to      speech abnormalities, acute aphasia secondary to ischemic event      involving the left middle cerebral artery territory cannot be ruled      out.  2. No signs of significant metabolic abnormality.   RECOMMENDATIONS:  1. MRI of the brain without and with contrast this morning.  2. EEG as well this morning.  3. Aspirin 81 mg per day.  4. Continue antibiotic and antiviral coverage for now.   Thank you for asking me to evaluate Ms. [name].      Noel Christmas, MD  Electronically Signed     CS/MEDQ  D:  11/14/2007  T:  11/14/2007  Job:  [id number]

## 2010-06-24 NOTE — Consult Note (Signed)
[name], [name]            ACCOUNT NO.:  [id number]   MEDICAL RECORD NO.:  [id number]          PATIENT TYPE:  INP   LOCATION:  2027                         FACILITY:  [facility]   PHYSICIAN:  Antonietta Breach, M.D.  DATE OF BIRTH:  Apr 30, 1940   DATE OF CONSULTATION:  11/18/2007  DATE OF DISCHARGE:                                 CONSULTATION   REASON FOR CONSULTATION:  Psychosis.   HISTORY OF PRESENT ILLNESS:  [name] is a 70 year old female  admitted to the [facility] on November 13, 2007 due to acute mental  status changes.   She has been experiencing approximately 7 days of depressive lethargy,  incoherence, and auditory hallucinations. At times she saw a couple of  people last night that were going to kidnap her grandchild.  She had  hallucinations of this while in the hospital.  She also has visualized  birds outside when there are none.  She continues with hallucinations.  She also has been having a delusion that she is in a dungeon.  Her son  and daughter are very concerned about her.   She did have some combativeness during the week after sundown.  In less  than 24 hours she has required three doses of Ativan.   She has had some difficulty with orientation as well as memory. She has  not had significant lack of interest.   Efforts to socially and verbally correct and support her have been non  effective. Also, these symptoms have not responded to general medical  care in the hospital.   PAST PSYCHIATRIC HISTORY:  [name] does have a history of  anxiety. In review of the past medical record in November of 2007 she  was on Luvox at that time.  The son and daughter do not know any history  of specific obsessive-compulsive type symptoms; however, there is a  history of anxiety.   Also in November of 2007 [name] was placed on Seroquel 50 mg  q.h.s.   In July of 2009 she was prescribed Xanax.   FAMILY PSYCHIATRIC HISTORY:  [name]  mother had Alzheimer's  disease.   SOCIAL HISTORY:  [name] took care of her mother the last 4 years  of her mother's life. [name] is divorced.  She has a son and a  daughter who are very attentive to her care.  Her son has flown down  from [facility] to be with her this week.   [name] does not use any alcohol or illegal drugs.  She lives in  [place] alone.   Occupationally she is retired.   PAST MEDICAL HISTORY:  Gastroesophageal reflux disease, hypertension,  hypothyroidism, back pain, gout.   MEDICATIONS:  The MAR is reviewed.  [name] is on Synthroid 200  mcg daily, Ativan 0.5 mg q.3 h. p.r.n., Ativan 1 mg q.6 h. p.r.n.   ALLERGIES:  CODEINE.   LABORATORY DATA:  WBC 5.9, hemoglobin 14.1, platelet count 248.  HSV-1  DNA was negative, HSV-2 DNA was negative on CSF tests.  Her CSF culture  was also negative.  Hemoglobin A1c within normal  limits.  SGOT 21, SGPT  18.  Urine drug screen positive for benzodiazepines.  Her urine culture  was negative.  Sodium 140, BUN 11, creatinine 0.99, glucose 133.  HIV,  folate, B12, TSH, RPR, ammonia, aspirin and alcohol all negative.   A head CT without contrast showed no acute abnormalities.   An MRI of the brain without contrast showed no acute abnormalities.  There was mild atrophy and mild small vessel disease changes.   REVIEW OF SYSTEMS:  Constitutional, head, eyes, ears, nose, throat,  mouth, neurologic, psychiatric, cardiovascular, respiratory,  gastrointestinal, genitourinary, skin, musculoskeletal, hematologic,  lymphatic, endocrine, metabolic all unremarkable.   PHYSICAL EXAMINATION:  VITAL SIGNS:  Temperature 98.5, pulse 86  respiratory rate 20, blood pressure 132/88, O2 saturation on room air  94%.  GENERAL APPEARANCE:  [name] is an elderly female sitting up in  her hospital chair with no abnormal involuntary movements.   MENTAL STATUS EXAM:  [name] is alert.  Her eye contact is  good.  Her attention span is mildly decreased, concentration mildly decreased.  Her affect is anxious.  Her mood is anxious. On orientation testing she  does not know the year.  She does know the month.  She does not know the  day of the month.  She knows the day of the week and the place. On  memory testing she names 3 out of 3 words immediate and 1 out of 3 on  recall. With visual objects she names 3 out of 3 immediate and 2 out of  3 on recall.   Fund of knowledge and intelligence grossly within normal limits.   Speech involves a slightly flat prosody.  There is no dysarthria.  Thought process is logical, coherent, goal-directed.  No looseness of  associations.  Thought content, please see the history of present  illness.  She describes hallucinations that she has been having. There  are no delusions.  She has no thoughts of harming herself or others.   Insight is partial, judgment is impaired.   ASSESSMENT:  AXIS I:  293.00, delirium not otherwise specified.  Rule  out 293.82, psychotic disorder, not otherwise specified.  293.83, mood  disorder, not otherwise specified (idiopathic and potential general  medical factors).  293.84, anxiety disorder, not otherwise specified.  This may represent generalized anxiety disorder.  There is a possibility  of obsessive-compulsive symptoms. This is in reference to her past  idiopathic history and not in reference to her current psychosis and  agitation.  AXIS II:  None.  AXIS III:  See past medical history.  AXIS IV:  General medical.  AXIS V:  25.   The undersigned provided ego supportive psychotherapy and education. The  patient wanted to have her daughter and son involved for education and  further facilitation of support.   The indications, alternatives and adverse effects of Seroquel were  discussed with the son and daughter as well as the patient, including  the risk of a nonreversible movement disorder, hyperglycemia. They   understand and would like their mother to have Seroquel.   The indications, alternatives and adverse effects of Aricept were  discussed with the son, daughter and the patient.  They understand and  would like to start Aricept once the acute current symptoms have  cleared.   RECOMMENDATIONS:  Would start Seroquel at 25 mg p.o. q.h.s. with 25 mg  available 1 hour after the first dose if the patient is having insomnia.   Would then increase  the Seroquel by 25-50 mg per day to the initial  trial dose of 75 mg q.h.s.  Would check the EKG to QTc after starting  the Seroquel as well as after any increases to ensure that it is less  than 500 milliseconds, if it is not then stop Seroquel.   Would continue low stimulation ego support. Would continue to have  memory and orientation cues in the room.   We would also monitor for any stiffness or other extrapyramidal side  effects of Seroquel.   If Ms. Bassette's agitation and psychosis clears she will be a  candidate for outpatient psychiatric follow up at one of the clinics  attached to [facility], [facility] or [facility].  There  are also private psychiatrists that take geriatric patients in the  community.   Also she may have the opportunity to see a psychiatrist on site at her  place of physical therapy, skilled nursing facility. Some of them do  have psychiatrists that round on site.   Once her psychosis and agitation clear she will be ready for a skilled  nursing facility short rehab stay.      Antonietta Breach, M.D.  Electronically Signed     JW/MEDQ  D:  11/18/2007  T:  11/18/2007  Job:  [id number]

## 2010-06-24 NOTE — Consult Note (Signed)
NAMEGIAMARIE, BUECHE NO.:  [id number]   MEDICAL RECORD NO.:  [id number]          PATIENT TYPE:  INP   LOCATION:  3041                         FACILITY:  [facility]   PHYSICIAN:  Antonietta Breach, M.D.  DATE OF BIRTH:  [birth year]   DATE OF CONSULTATION:  11/22/2007  DATE OF DISCHARGE:                                 CONSULTATION   Ms. Russon forgets very easily the topic and elements of her  discourse as she is conversing.  She did require 50 mg of Seroquel last  night (25 initially then 25 again 1 hour later).  She he is not having  any current hallucinations or delusions.  She is cooperative with care.  She does have orientation intact.   REVIEW OF SYSTEMS:  NEUROLOGIC:  No stiffness or other extrapyramidal  side effects.   EXAMINATION:  VITAL SIGNS:  Temperature 96.9.  Pulse 76.  Respiratory  rate 18.  Blood pressure 100/64.  O2 saturation on room air 92%.   MENTAL STATUS EXAM:  Mrs. Ovitt has decreased attention.  Her eye  contact is good.  Affect is mildly flat.  Mood within normal limits.  She is oriented to all spheres, however, memory as above.  Speech is  showing a mildly flat prosody.  No dysarthria.  Thought process, please  see the above.  She does make coherent statements.  Thought content, no  thoughts of harming herself, no thoughts of harming others, no  delusions, no hallucinations.  Insight is poor.  Judgment is impaired.   Please see below.   ASSESSMENT:  1. 293.00, delirium, not otherwise specified, improved.  2. 293.82, psychotic disorder, not otherwise specified.  This appears      to be clearing.  3. 293.84, anxiety disorder, not otherwise specified, rule out      generalized anxiety disorder.  There are also some obsessive-      compulsive elements which resulted in her being placed on Luvox in      the past.   RECOMMENDATIONS:  1. Would increase the Seroquel to 75 mg q.h.s. for anti-agitation and      continue the  anti-psychosis.  2. Would consider Aricept and/or Namenda.  3. Would continue to monitor for Seroquel extrapyramidal side effects.  4. If the patient's anxiety symptoms return, such as excessive worry      or obsessions, compulsions, would start Celexa 10 mg q.a.m., which      she has never been tried on, and then would increase as tolerated      to the target dose of 40 mg q.a.m., a dosage which is effective for      anxiety symptoms with maximum benefit over 16 weeks.  5. The Celexa's profile with the cytochrome P450 enzyme system is      optimal when having other general medical medications.  6. Regarding the patient's capacity, she lacks capacity due to the      fact that she cannot appreciate her risk for      morbidity and mortality when outside of an institutional care or a      24-hour per 7-day  care.  She also does have significant memory      difficulty.  She cannot recall her general medical conditions nor      can she recall medications required to treat them.      Antonietta Breach, M.D.  Electronically Signed     JW/MEDQ  D:  11/23/2007  T:  11/23/2007  Job:  [id number]

## 2010-06-24 NOTE — H&P (Signed)
NAMEMAKEBA, DELCASTILLO            ACCOUNT NO.:  [id number]   MEDICAL RECORD NO.:  [id number]          PATIENT TYPE:  INP   LOCATION:  3112                         FACILITY:  [facility]   PHYSICIAN:  Therisa Doyne, MD    DATE OF BIRTH:  September 10, 1940   DATE OF ADMISSION:  11/13/2007  DATE OF DISCHARGE:                              HISTORY & PHYSICAL   CHIEF COMPLAINT:  Altered mental status.   HISTORY OF PRESENT ILLNESS:  A 70 year old white female with past  medical history significant for hypertension, hypothyroidism presents  emergency department altered mental status.  All of the history is  obtained from the patient's daughter.  Apparently the patient was in her  normal state of health until yesterday, on Saturday, when she developed  a severe headache.  This headache was present all day.  She subsequently  had episodes of vomiting and diarrhea.  Earlier today, on Sunday, the  patient had difficulty performing routine daily tasks.  She had  difficulty using the phone or writing with a pen.  Her daughter went  over to her house and found her to be extremely lethargic.  Additionally, she was speaking incoherently and inappropriately, and  having confabulations.  Because of these symptoms, the patient's  daughter brought the patient to the emergency department for further  evaluation.  Of note, the patient has been exposed to Streptococcus  pharyngitis as well as H1N1 influenza   In the emergency department, the patient underwent lumbar puncture as  well as received empiric Rocephin.   PAST MEDICAL HISTORY:  1. Gastroesophageal reflux disease.  2. Hypertension.  3. Hypothyroidism.  4. Back pain, status post spinal injection 6 weeks.  5. Gout.  6. History of genital herpes.   SOCIAL HISTORY:  The patient lives at home in [place].  Unable to  obtain an alcohol or tobacco history.   FAMILY HISTORY:  Unable to obtain secondary to mental status.   REVIEW OF SYSTEMS:  Unable to  obtain secondary to mental status.   MEDICATIONS:  Unable to obtain secondary to mental status.  The  patient's pharmacy will need to be called tomorrow.   ALLERGIES:  CODEINE.   PHYSICAL EXAMINATION:  VITAL SIGNS:  Temperature 98.9 on admission.  Current temperature of 102 degrees Fahrenheit, blood pressure 160/127,  pulse 100, respirations 20, oxygen 97% room air.  GENERAL:  No acute distress.  HEENT:  Normocephalic, atraumatic.  Pupils equal, round and reactive to  light and accommodation.  Extraocular movements are intact.  Oropharynx  reveals dry mucous membranes.  NECK:  Supple.  There is no meningismus.  CARDIOVASCULAR:  Regular rate and rhythm.  No murmurs, rubs or gallops.  CHEST:  Clear to auscultation bilaterally.  ABDOMEN:  Positive bowel sounds. Soft, nontender, nondistended.  EXTREMITIES:  1+ lower extremity edema, 2+ dorsalis pedis pulses.  NEUROLOGIC:  The patient is oriented to person only.  She is disoriented  to place and time.  She has no distinct focal deficits but her sensorium  is globally altered.  She is able to move all four extremities but  cannot cooperate with a detailed  musculoskeletal exam.   LABORATORY STUDIES:  CBC within normal limits.  CMP within normal  limits.  Coagulation studies normal.  Alcohol level undetectable.  Urinalysis normal.  Urine drug screen was positive for benzodiazepines.  Cerebral spinal fluid studies showed a white blood cell count of 3,  protein elevated at 101 and glucose normal at 71.   STUDIES:  1. CT of the head negative for acute intracranial abnormalities.  2. Chest x-ray:  No acute cardiopulmonary disease.   ASSESSMENT/PLAN:  A 70 year old  white female with multiple medical  problems presents the emergency room with fever and altered mental  status.   1. Admit the patient to ICU under the [facility] hospitalist care.   1. Fever and altered mental status.  At the time differentials broad.      The leading diagnosis  is viral meningitis versus      meningoencephalitis versus other infectious causes versus toxin      exposure versus hyperthyroidism.  I have discussed the case with      Dr. Roseanne Reno at [facility] who has agreed to consult on      the patient in the morning.  We will send viral encephalitis panel      including HSVPCR.  In the interim, we will  empirically treat the      patient with acyclovir 10 mg/kg IV q.8 h.  We will do this until      her HSVPCR  returns.  Of note, she does have a history of genital      herpes.  Will check MRI of the brain and EEG.  Check infectious      workup with blood cultures and urine cultures.  Check toxin screen      as well as a TSH.  Hold all sedating medications at this time.  We      will hold any other antibacterial medications.  However, should the      patient clinically deteriorate, then it will be necessary to      broaden our antimicrobial coverage.  Monitor in ICU for strict      neurological checks q.1 hour.   1. The patient has multiple medical problems including gout,      hypertension and hypothyroidism.  At this time the patient cannot      tell me a list of her medications.  It will be necessary to call      the pharmacy in the morning  to sort this out.   1. Fluids, electrolytes, and nutrition.  Normal saline at 100 m./hour      until electrolytes are stable.  Regular diet.   1. Deep venous thrombosis prophylaxis with Lovenox.      Therisa Doyne, MD  Electronically Signed     SJT/MEDQ  D:  11/14/2007  T:  11/14/2007  Job:  [id number]

## 2010-06-24 NOTE — Discharge Summary (Signed)
[name], [name]            ACCOUNT NO.:  [id number]   MEDICAL RECORD NO.:  [id number]          PATIENT TYPE:  INP   LOCATION:  3041                         FACILITY:  [facility]   PHYSICIAN:  Valerie A. Felicity Coyer, MDDATE OF BIRTH:  07-14-1940   DATE OF ADMISSION:  11/13/2007  DATE OF DISCHARGE:  11/23/2007                               DISCHARGE SUMMARY   PRIMARY CARE PHYSICIAN:  Lelon Perla, DO   DISCHARGE DIAGNOSES:  1. Altered mental status with questionable viral encephalopathy      resolved at time of discharge.  2. Mood disorder and delirium, not otherwise specified.   HISTORY OF PRESENT ILLNESS:  Ms. Aday is a 70 year old white female  with past medical history of hypertension and hypothyroidism.  The  patient was brought to [facility] emergency room on day of admission by  her daughter with reports of altered mental status.  According to the  daughter at time of admission the patient was in her normal state of  health until day prior to admission when the patient began complaining  of severe headache.  The daughter reported headache persisted all day  associated with vomiting and diarrhea then with altered mental status.  The daughter arrived at the patient's home to find her extremely  lethargic, speaking incoherently and inappropriately and therefore  brought the patient to the emergency room for further evaluation.  Upon  admission evaluation in the emergency room the patient was found to be  oriented to person only without any distinct motor or sensory deficits.  However, her sensorium was globally altered and the patient was admitted  at that time for further evaluation and treatment.   PAST MEDICAL HISTORY:  1. GERD.  2. Hypertension.  3. Hypothyroidism.  4. Chronic back pain status post spinal injection approximately 6      weeks prior to admission.  5. Gout.  6. History of genital herpes.   CONSULTATIONS DURING THIS ADMISSION:  1. [facility].  2. Antonietta Breach, M.D., psychiatry.   COURSE OF HOSPITALIZATION:  1. Altered mental status.  Again, a patient with no focal motor or      sensory deficits on exam.  However, her sensorium was quite altered      at time of admission.  CT of the head was obtained and negative for      any acute findings.  Lumbar puncture was performed in the ER and      was negative aside from slightly elevated protein at 100.  The      patient was started on empiric Rocephin at time of admission for      questionable bacterial etiology as she did have a low-grade      temperature early on in hospitalization.  Neurology was asked to      see the patient in consultation who recommended MRI/MRA of the      brain, both were negative for any acute findings.  Due to the      patient's history of genital herpes, herpes simplex virus      serologies were obtained which were negative.  In  addition blood      cultures x2 obtained at time of admission were negative for any      growth.  Also due to the patient's history of herpes the patient      was placed on empiric acyclovir early in hospitalization which has      since been discontinued per neurology.  EEG obtained during      hospitalization revealed generalized slowing.  At time of dictation      the patient's mental status has returned to baseline with no clear      etiology of the patient's symptoms.  At this time the patient has      completed a total of 3 days of acyclovir as well as 5 days of      Levaquin IV antibiotics of discharge greater than 24 hours.  At      time of dictation viral encephalopathy remains a concern as PCR is      still pending at time of dictation.  Of note HIV and RPR screenings      both nonreactive.  2. Unspecified mood disorder with delirium.  Again, etiology of      problem #1 unclear.  Neurology did recommend psych consult as son      did report the patient living quite reclusive lifestyle with no      outside  contact aside from daughter and grandson.  Dr. Jeanie Sewer      did see the patient in consultation and recommended starting the      patient on Seroquel which the patient has tolerated well.  Dose has      been increased just prior to discharge.  Psychiatry has also      recommended that the patient be started on low-dose Celexa should      she have any increased anxiety.  The patient has been found to lack      capacity and insight for medical decision making, therefore, the      family as well as the patient have decided on skilled nursing      facility at time of discharge as family unable to provide 24-hour      supervision.   DISCHARGE MEDICATIONS:  1. Synthroid 200 mcg p.o. daily.  2. Lomotil 1 tablet p.o. b.i.d.  3. Benicar/HCT 40/25 one tablet p.o. daily.  4. Mobic 15 mg p.o. daily.  5. AcipHex 20 mg p.o. daily  6. Atenolol 50 mg p.o. daily.  Note this is a new dose.  7. Seroquel 75 mg p.o. q.h.s.  8. Tylenol 650 mg p.o. q.4 h p.r.n. pain or fever.  9. Ativan 1 mg p.o. q.6 h p.r.n. agitation.   PERTINENT DISCHARGE LAB WORK:  White cell count 5.9, platelet count 248,  hemoglobin 14.1, hematocrit 41.4, sodium 140, potassium 3.9, BUN 11,  creatinine 0.98, blood cultures x2 obtained on November 13, 2007, were  negative for any growth.  HSV not detected.  CSF negative for any  infection.  Urine culture negative for any growth.  HIV antibody  nonreactive.  HSV 6.2.  Homocysteine 7.4, hemoglobin A1c 6.1, vitamin  B12 within normal limits.  TSH within normal limits.  RPR nonreactive.  Sed rate within normal limits.  Total cholesterol 190, triglycerides  115, HDL 43, LDL 124.   DISCHARGE PHYSICAL EXAMINATION:  VITAL SIGNS:  Blood pressure 109/76,  heart rate 84, respirations 20, temperature 98.2, O2 sat is 100% on room  air.  GENERAL:  In general this is  a white female awake and alert and in no  acute distress.  HEENT:  Head is normocephalic, atraumatic.  Pupils are equal, round,   reactive to light.  Extraocular movements are intact.  No scleral  icterus or injection.  Ears, nose and throat mucous membranes are moist.  No oral pharyngeal lesions.  NECK:  Neck is supple.  No thyromegaly or lymphadenopathy.  CHEST:  With symmetrical movement, nontender to palpation.  CARDIOVASCULAR:  S1-S2, regular rate and rhythm.  No murmurs, rubs or  gallops.  No lower extremity edema.  No JVD, no carotid bruits.  RESPIRATORY:  Lung sounds are clear to auscultation bilaterally.  No  wheezes, rales or crackles.  No increased work of breathing.  GI:  Abdomen is soft, nontender, nondistended with positive bowel  sounds.  No appreciated masses or hepatosplenomegaly.  NEUROLOGICAL:  The patient moves all extremities x4 without any focal  motor or sensory deficits on exam.  PSYCHIATRIC:  The patient is alert and oriented x3.  The patient does  exhibit poor attention span.  However, her eye contact is good.  Her  affect is mildly flat but within normal limits.   DISPOSITION:  The patient is felt medically stable for discharge to  skilled nursing facility at this time.  Again, the patient lacks the  capacity and insight for medical decision making.  Therefore, cannot  refuse placement at this time.  However, the patient is agreeable to  skilled nursing facility short-term stay at time of discharge.  Greater  than 30 minutes spent on discharge planning.      Cordelia Pen, NP      Raenette Rover. Felicity Coyer, MD  Electronically Signed    LE/MEDQ  D:  11/23/2007  T:  11/23/2007  Job:  [id number]   cc:   Lelon Perla, DO

## 2010-06-24 NOTE — Discharge Summary (Signed)
[name], [name]            ACCOUNT NO.:  [id number]   MEDICAL RECORD NO.:  [id number]          PATIENT TYPE:  INP   LOCATION:  5501                         FACILITY:  [facility]   PHYSICIAN:  Valerie A. Felicity Coyer, MDDATE OF BIRTH:  [birth year]   DATE OF ADMISSION:  08/14/2007  DATE OF DISCHARGE:  08/15/2007                               DISCHARGE SUMMARY   DISCHARGE DIAGNOSES:  1. Labile hypertension, currently stable on increased dose of      clonidine and labetalol.  2. Headache, resolved.  3. Upper respiratory illness symptoms, stable on Mucinex and Flonase.   HISTORY OF PRESENT ILLNESS:  Ms. Long is a 70 year old white female  who was admitted on July 15, 2007, with chief complaint of elevated blood  pressure.  She notes blood pressure as high as 200/125 and as low as 110-  140s/80s.  She saw her primary care Danamarie Minami twice recently.  Her  Norvasc was discontinued.  She was started on clonidine 0.1 mg twice  daily on August 04, 2007.  She also noted a frontal headache, mostly with  elevated blood pressure.  She did develop nausea for 24 to 36 hours  which she usually finds is associated with headache.  She was seen in  the emergency room on August 10, 2007, where a CT of the head was performed  and was negative.  Her blood pressure on the day of admission was  approximately 200 systolic per EMS.  In the emergency room, her blood  pressure was 176/95.  She received clonidine and labetalol.  Her blood  pressure dropped to 147/84.  She was admitted for further evaluation and  medication adjustment.   PAST MEDICAL HISTORY:  1. Gout.  2. Hypertension.  3. Hypothyroidism.  4. Dyslipidemia.  5. History of carpal tunnel surgery, 2005.  6. Sinus surgery.  7. Back surgery, 2007.  8. History of migraines.   COURSE OF HOSPITALIZATION:  Uncontrolled blood pressure:  The patient  was admitted.  Her atenolol was changed to labetalol and her clonidine  was increased to 0.2 mg p.o.  b.i.d.  She was continued on Benicar.  Her  blood pressure at the time of discharge is 112/60, and her headache and  nausea had resolved.  Serial cardiac enzymes were performed which were  negative.  LFTs are within normal limits.   MEDICATIONS AT TIME OF DISCHARGE:  1. Aciphex 20 mg p.o. daily.  2. Synthroid 137 mcg p.o. daily.  3. Benicar 40 mg p.o. daily.  4. Labetalol 100 mg p.o. b.i.d. in place of atenolol.  5. Klonopin 0.2 mg p.o. b.i.d.  6. Luvox 100 mg p.o. b.i.d.  7. Questran 4 g p.o. daily.  8. Mucinex DM 1 tab p.o. daily as needed.  9. Fosamax 70 mg p.o. weekly.  10.Darvocet-N 100 one tab p.o. 4 times daily as needed.  11.Celebrex 200 mg p.o. daily as needed.  12.Colchicine 0.6 mg 2 times daily as needed.  13.Nasonex 2 sprays each nostril daily.  14.Alprazolam 0.5 mg 1-2 tablets p.o. nightly as needed.   DISPOSITION:  She will be discharged to home.   FOLLOW  UP:  She is instructed to follow up with Dr. Loreen Freud on  Wednesday, August 31, 2007, at 11:30 a.m.  She is instructed to call Dr.  Laury Axon should she develop systolic blood pressures greater than 180.  She  is also instructed to continue a low-sodium, heart-healthy diet.   Greater than 30 minutes were spent on discharge planning.      Sandford Craze, NP      Raenette Rover. Felicity Coyer, MD  Electronically Signed    MO/MEDQ  D:  08/15/2007  T:  08/16/2007  Job:  [id number]   cc:   Lelon Perla, DO

## 2010-06-27 ENCOUNTER — Other Ambulatory Visit: Payer: Self-pay

## 2010-06-27 MED ORDER — OLMESARTAN MEDOXOMIL 20 MG PO TABS: 20.0000 mg | ORAL_TABLET | Freq: Every day | ORAL | Status: DC

## 2010-06-27 NOTE — Assessment & Plan Note (Signed)
[facility]                           GASTROENTEROLOGY OFFICE NOTE   NAME:[name], Bailey                     MRN:          [id number]  DATE:09/15/2005                            DOB:          Apr 07, 1940    [name] is doing well except for some dysphagia that has just recently come  on without any increased reflux symptoms. She is taking regular AcipHex.  Have cut her Entocort down to 6 mg a day without relapse of her microscopic  colitis. She is using her B12 supplements and is on multiple other  medications. This has been reviewed in her chart.   PHYSICAL EXAMINATION:  VITAL SIGNS:  All normal. She continues to keep her  weight under fairly good control at 246 pounds. Blood pressure 130/80.  HEENT:  Oropharynx was generally unremarkable.   ASSESSMENT:  1.  I think this patient most likely has an element of candida esophagitis      associated with PPI therapy and Entocort therapy. It certainly is      unlikely that she has esophageal stricture since she had endoscopy      within the last year that did not show such.  2.  History of chronic gastroesophageal reflux disease.  3.  History of microscopic colitis related to previous NSAID use which      appears to be resolving.  4.  Multiple medical issues with multiple medications.   RECOMMENDATIONS:  1.  Decrease Entocort to 3 mg a day as tolerated.  2.  Trial of Mycostatin mouth wash 5 cc four times a day for 10 days.  3.  Continue other medications that are listed and reviewed in her chart.  4.  GI followup in 6-8 weeks time.                                   Vania Rea. Jarold Motto, MD, Clementeen Graham, [place]   DRP/MedQ  DD:  09/15/2005  DT:  09/15/2005  Job #:  [id number]   cc:   Loreen Freud, MD

## 2010-06-27 NOTE — H&P (Signed)
[name], [facility]            ACCOUNT NO.:  [id number]   MEDICAL RECORD NO.:  [id number]          PATIENT TYPE:  INP   LOCATION:  NA                           FACILITY:  [facility]   PHYSICIAN:  Georges Lynch. Gioffre, M.D.DATE OF BIRTH:  08-Jan-1941   DATE OF ADMISSION:  12/23/2005  DATE OF DISCHARGE:                                HISTORY & PHYSICAL   CHIEF COMPLAINT:  Lower back and bilateral leg pain.   HISTORY OF PRESENT ILLNESS:  The patient is a 70 year old female here today  for evaluation of her lower back and back pain who has been evaluated by MRI  and CT with myelogram.  The patient was found to have a complete block at L3-  4, L4-5 with grade 1 pseudospondylolisthesis at L4-5.  Dr. Darrelyn Hillock feels she  needs a central decompression with laminectomy at L3-4 with a central  decompression laminectomy at L4-5.  There is a central disk protrusion at L4-  5 which he will evaluate intraoperatively.   ALLERGIES:  CODEINE.   CURRENT MEDICATIONS:  1. Entocort EC.  2. K-Dur.  3. Lomotil.  4. Atenolol.  5. Darvocet.  6. Synthroid.  7. Benicar.  8. __________.  9. Fluvoxamine.  10.Multivitamin.   PAST MEDICAL HISTORY:  1. Hypertension.  2. Hiatal hernia with reflux.  3. History of ulcers.  4. History of gastritis.  5. History of diarrhea.  6. History of borderline diabetes, currently being worked up.  7. Obesity.   PAST SURGICAL HISTORY:  1. Uterine polyp removal.  2. Carpal tunnel release.  3. Patient's only problems with anesthesia has been severe nausea and      vomiting.   FAMILY HISTORY:  Mother is alive at 32 years of age with a history of kidney  cancer, heart disease and arthritis.  Father is deceased from complications  of emphysema.   PRIMARY CARE PHYSICIAN:  Loreen Freud.   GASTROINTESTINAL PHYSICIAN:  Dr. Vonita Moss.   PHYSICAL EXAMINATION:  VITAL SIGNS:  Height 5 feet 7 inches, weight 235,  blood pressure 152/80, pulse 70 and regular, respirations 12,  afebrile.  GENERAL:  This is a healthy appearing female, conscious, alert and  appropriate.  Centrally obese.  Ambulates in and out of the exam room, on  and off the exam table without much difficulty or obvious distress.  HEENT:  Normocephalic, atraumatic.  Pupils equal, round and reactive.  Extraocular movements intact.  Oral buccal mucosa is pink and moist.  NECK:  Supple.  No palpable lymphadenopathy.  She had excellent range of  motion of her cervical spine without any difficulty or tenderness.  CHEST:  Lungs sounds were clear and equal bilaterally.  No wheezes, rales or  rhonchi.  HEART:  Regular rate and rhythm.  No murmurs, rubs or gallops.  ABDOMEN:  Round, obese-like, soft, bowel sounds were normoactive.  No CVA  region tenderness.  EXTREMITIES:  Upper extremities were symmetrically sized and shaped.  She  had good range of motion of her shoulders, elbows and wrists.  Motor  strength was 5/5.  Lower extremities were symmetrically sized and shaped.  She had good  range of motion with hips, knees and ankles without any  difficulty.  NEUROLOGICAL:  Examination of her lower extremities, intact to light touch  sensation and motor strength.  She has fairly good range of motion of her  lower back without any radicular type complaints down the extremities.   Peripheral vascular:  Carotid pulses were 2+, radial pulses were 2+,  dorsalis pedis pulses were 2+.  She had no lower extremity venostasis  changes.  BREAST/RECTAL/GU:  Deferred at this time.   IMPRESSION:  1. Spinal stenosis at L3-4, L4-5 with grade 1 pseudospondylolisthesis at      L4-5 with a central disk protrusion at L4-5.  2. History of hiatal hernia with reflux.  3. History of ulcers.  4. History of chronic diarrhea.  5. History of borderline diabetes, currently being worked up.  6. Obesity.   PLAN:  The patient will undergo all routine labs and tests prior to having a  central decompression at L3-4, L4-5 due to  significant spinal stenosis.  Also, disk protrusion evaluation at L4-5.      Jamelle Rushing, P.A.    ______________________________  Georges Lynch Darrelyn Hillock, M.D.    RWK/MEDQ  D:  12/10/2005  T:  12/10/2005  Job:  [id number]   cc:   Windy Fast A. Darrelyn Hillock, M.D.  Fax: [phone]

## 2010-06-27 NOTE — Op Note (Signed)
NAMESAFIRE, [name]            ACCOUNT NO.:  [id number]   MEDICAL RECORD NO.:  [id number]          PATIENT TYPE:  INP   LOCATION:  0008                         FACILITY:  [facility]   PHYSICIAN:  Georges Lynch. Gioffre, M.D.DATE OF BIRTH:  1941/01/18   DATE OF PROCEDURE:  12/23/2005  DATE OF DISCHARGE:                                 OPERATIVE REPORT   SURGEON:  Dr. Darrelyn Hillock   ASSISTANT:  Dr. Fayrene Fearing [place].   PREOPERATIVE DIAGNOSES:  1. Severe spinal stenosis at L3-4.  2. Severe spinal stenosis at L4-5 with a pseudo spondylolisthesis at L4-5.  3. Bulging disk L4-5.   PROCEDURE:  1. Complete decompressive lumbar laminectomy at L3-4.  2. A complete lumbar decompressive lumbar laminectomy at L4-5.  3. Bilateral foraminotomies at L3-4 and at L4-5.   PROCEDURE:  Under general anesthesia, routine orthopedic prep and drape of  the back was carried out.  The patient had 1 g of IV Ancef preop.  At this  time, 2 needles were placed back for localization purposes.  An x-ray was  taken.  Following this, we then went down and made an incision directly over  the L3-4, L4-5 interspace, removed the spinous processes of these areas as  well, and then another x-ray was taken prior to removing the spinous  processes.  We went down and did a complete central decompressive lumbar  laminectomy.  We tried to preserve the facettes as much we could at this  point.  We did extensive foraminotomies.  We removed the thickened  ligamentum flavum in that area bilaterally.  Note the most severe  constriction was at the L4-5 space.  We had a waist-like defect in the dura.  The dura was quite thin.  We did not violate the dura.  We were able to  easily and gently with the microscope remove the ligamentum flavum from the  dura as well as going out widely and decompressing the lateral recesses and  doing foraminotomies.  No diskectomy was necessary.  Thoroughly irrigated  out the area.  We then took the hockey-stick  and went proximally and  distally to make sure there were no other compressions on the dura.  There  were not; we were free proximally, distally, medially, and laterally.  Thoroughly irrigated out the area, loosely applied some thrombin-soaked  Gelfoam.  I inserted a quarter-inch Penrose drain and at this time, I closed  the wound in usual fashion except I left the a small portion of the distal  deep wound as well as the small portion of the deep proximal part of the  wound open for drainage purposes.  Remaining part of the wound was closed in  layers in usual fashion.  Sterile Neosporin dressings were applied.  The  patient left the operating room in satisfactory condition after 30 mg  Toradol was given IV.           ______________________________  Georges Lynch. Darrelyn Hillock, M.D.     RAG/MEDQ  D:  12/23/2005  T:  12/23/2005  Job:  [id number]

## 2010-06-27 NOTE — Discharge Summary (Signed)
[name], [name]            ACCOUNT NO.:  [id number]   MEDICAL RECORD NO.:  [id number]          PATIENT TYPE:  INP   LOCATION:  1421                         FACILITY:  [facility]   PHYSICIAN:  Georges Lynch. Gioffre, M.D.DATE OF BIRTH:  02-17-1940   DATE OF ADMISSION:  12/23/2005  DATE OF DISCHARGE:  12/25/2005                               DISCHARGE SUMMARY   ADMISSION DIAGNOSES:  __________  .   DISCHARGE DIAGNOSES:  __________  .   MEDICATIONS UPON ADMISSION:  1. Atenolol 100 mg a day.  2. Synthroid __________ mcg a day.  3. Benicar 40/25 mg a day.  4. Entocort EC 3 mg three tablets a day.  5. __________  100 mg a day.  6. Lomotil one tablet a day.  7. Klor-Con 20 mEq a day.  8. __________  20 mg a day.  9. Seroquel 25 mg two tabs at q.h.s.  10.Darvocet p.r.n.  11.Multivitamins.   SURGICAL PROCEDURE:  On December 23, 2005 the patient was taken to the  OR by Dr. Ranee Gosselin. Assisted by  Dr. Marlowe Kays. Under general anesthesia the patient underwent a  complete decompressive lumbar laminectomy done at 3-4 and bilateral  foraminotomies at L3, L4, and L4-L5 due to her spinal stenoses at L3-L4  and spinal stenosis L4-L5 with pseudospondylothesis at L4-L5 with  bulging disk at L4-L5. The patient tolerated the procedure well. There  were no complications. The patient was transferred to the recovery room  and then on to the orthopedic floor for routine postoperative care.   CONSULTS:  The following routine consults were __________  physical  therapy.   HOSPITAL COURSE:  On December 23, 2005 the patient was admitted to  [facility] under the care of  Dr. Worthy Rancher. The patient was taken to the OR where a decompressive  lumbar laminectomy and foraminotomies were performed at L3-L4, L4-L5 due  to her spinal stenosis. The patient tolerated the procedure well and was  transferred to the recovery room and then to orthopedic floor on IV pain  medicines,  antibiotics, and routine DVT prophylaxis. The patient then  __________  two days postoperative course which the patient did very  well. Her wound remained benign for any signs of infection. Her lower  legs remained neurologically intact. She had less discomfort. She had no  neurologic defects. The patient progressed with physical therapy as per  protocol, and it was felt on day #2 she was ready for discharge home for  routine outpatient care. The patient was able to be transitioned over to  p.o. meds while without any issues. The patient was discharged in good  condition with routine followup.   LABORATORY DATA:  CBC on admission:  WBC 12.4, hemoglobin 15.2,  hematocrit 43.2, platelets 386. Routine chemistries:  Sodium 137,  potassium 3.6, glucose 185, BUN 21, creatinine 1. Routine urinalysis on  admission was normal.   An EKG with normal sinus rhythm at 72 beats per minute on admission.   MEDICATIONS UPON DISCHARGE:  1. On orthopedics floor the patient was initially managed on the      Dilaudid PCA,  but was able to transition over to Percocet one or      two tablets every 4-6 hours p.r.n. pain.  2. Atenolol 100 mg a day.  3. Levothyroxine 200 mcg a day.  4. Avapro 300 mg a day.  5. Hydrochlorothiazide 25 mg a day.  6. Entocort 9 mg a day.  7. Luvox 100 mg a day.  8. Lomotil one tablet a day.  9. Potassium chloride 20 mEq a day.  10.Protonix 40 mg a day.  11.Seroquel 25 mg q.h.s.  12.Multivitamins with minerals one tablet a day.  13.Laxative enema of choice p.r.n.  14.Robaxin 500 mg q.6 h. p.r.n.  15.Reglan 10 mg p.o. q.6 h. p.r.n.  16.Phenergan 25 mg p.o. q.6 h. p.r.n.  17.Restoril 15 mg p.o. q.h.s. p.r.n.   DIET:  No restrictions.   ACTIVITY:  The patient may walk with the assistance of a walker. The  patient needs to change her dressing on a daily basis.   MEDICATIONS:  The patient is to resume routine home meds with the  addition of Percocet one or two tablets every  4-6 hours for pain and  Robaxin 500 mg one tablet every six hours for muscle spasms.   FOLLOWUP:  The patient needs a followup appointment with Dr. Darrelyn Hillock in  his office two weeks from discharge. The patient is to call for an  appointment.   Home health physical therapy evaluation as provided by [place].   CONDITION UPON DISCHARGE TO HOME:  Listed as good and improved.      Jamelle Rushing, P.A.    ______________________________  Georges Lynch Darrelyn Hillock, M.D.    RWK/MEDQ  D:  01/13/2006  T:  01/13/2006  Job:  [id number]

## 2010-06-27 NOTE — Assessment & Plan Note (Signed)
[facility]                         GASTROENTEROLOGY OFFICE NOTE   NAME:[name]                   MRN:          [id number]  DATE:03/11/2006                            DOB:          1940/03/23    [name] has had return of watery diarrhea, rectal urgency and spasm for  the last month.  She did not decrease her Entocort as previously thought  in August.  She did receive a treatment of Mycostatin mouthwash which  relieved her dysphasia.  She has not been on recent antibiotics, has had  no known infectious disease exposure or new medications.  She is on a  variety of medications listed and reviewed in her chart.  She recently  had repeat orthopedic surgery and is back on Indocin 75 mg a day.  She  has had no anorexia, weight loss, fever, chills, systemic complaints.  He has not had a cholecystectomy but denies hepatobiliary problems.  She  does have chronic GERD for which she takes AcipHex 20 mg a day.  She  also empirically takes Ultrase 20 mg t.i.d. for suspected chronic  pancreatic insufficiency for a history of steatorrhea.   PHYSICAL EXAMINATION:  VITAL SIGNS:  She weighs 253 pounds, her blood  pressure 126/80.  Pulse is 95 and regular.  GENERAL:  She is a non-toxic healthy-appearing white female in no acute  distress.  There is no stigmata of chronic liver disease noted.  ABDOMEN:  Showed no hepatosplenomegaly, or masses, or tenderness.  She  had very active high-pitched bowel sounds.  Inspection of the rectum was unremarkable as was rectal exam  There was  loose stool present.  It was guaiac negative.   ASSESSMENT:  Certainly positive [name] has bacterial overgrowth syndrome  associated with chronic pancreatitis and a chronic B-12 deficiency.  Her  previous response to Entocort, despite negative colon biopsies, negative  inflammatory bowel disease, serologies are somewhat surprising.  I did  give some support to possible microscopic vast  collagenous colitis.  I  am really at a loss this time to explain why she has had a relapse of  her problem, unless it is related to Indocin therapy.   RECOMMENDATIONS:  1. Repeat screening laboratory data and stool exams.  2. Continue Entocort at current dose.  3. Trial of Xifaxan 400 mg t.i.d. for 10 days, along with Align      probiotic therapy.  4. Consider repeat colonoscopy exam, depending on her clinical course.      This was last performed in May of 2006, was entirely normal.     [name]. Jarold Motto, MD, Caleen Essex, FAGA  Electronically Signed    DRP/MedQ  DD: 03/11/2006  DT: 03/11/2006  Job #: [id number]

## 2010-06-27 NOTE — Assessment & Plan Note (Signed)
 [facility]                         GASTROENTEROLOGY OFFICE NOTE   NAME:[name], Bailey BAEZ                   MRN:          [id number]  DATE:06/10/2006                            DOB:          07/27/1940    [name]'s colonoscopy was unremarkable except for some  hyperplastic polyps that were removed.  Colon biopsy showed no evidence  of microscopic colitis and she was taken off of Ventricor.  She was  placed on Questran 4 grams a day and has had complete resolution of her  symptoms.  She does complain of abdominal gas and bloating and I have  given her a combination of a digestive enzymes/Probiotic to use.  We  will see how she does symptomatically.  This patient's GI workup is well  detailed and otherwise has been unremarkable.  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  We  will continue to see her on a p.r.n. basis as needed.     Vania Rea. Jarold Motto, MD, Caleen Essex, FAGA  Electronically Signed    DRP/MedQ  DD: 06/10/2006  DT: 06/10/2006  Job #: [id number]

## 2010-06-27 NOTE — Assessment & Plan Note (Signed)
[facility]                         GASTROENTEROLOGY OFFICE NOTE   NAME:[name], STASIA SOMERO                   MRN:          [id number]  DATE:04/16/2006                            DOB:          September 04, 1940    CHIEF COMPLAINT:  Diarrhea.   HISTORY:  [name] is a 70 year old white female known to Dr. Jarold Motto,  who has been seen over the past couple of years because of problems with  chronic diarrhea.  She has had colonoscopy and colon biopsies done most  recently in October 2006, which were negative for any sort of colitis.  She had been treated for suspected pancreatic insufficiency, but really  was not benefited with the use of Ultrase.  She has been given a couple  of trials of antibiotics and most recently a trial of Xifaxan and Align  for potential bacterial overgrowth, which she did not feel helped her at  all.  She was placed on Entocort in the summer of 2007, which she feels  made a big difference in her diarrhea.  She is currently taking 6 mg a  day.  It is felt that she may have a microscopic or collagenous colitis  that was not picked up on her previous biopsies.  She also has been  fairly chronically on antiinflammatories because of back problems and  generally takes at least 1 Indocin per day.  Today, she seems a bit  confused about all of her medications.  She is not sure whether she is  still taking Indocin or not every day.  She says she occasionally has  some abdominal cramping, but primarily is just bothered by diarrhea and  urgency.  On an average day, has 4 to 5 loose stools per day.  Very  seldom has a nocturnal episode of diarrhea.  No fevers or chills.  Her  appetite is fine.  Her weight remains stable.  She has no problems with  nausea or vomiting.   CURRENT MEDICATIONS:  1. Benicar 40/25 daily.  2. Synthroid 200 mcg daily.  3. Fluvoxamine 100 mg daily.  4. Allegra 180 mg daily.  5. Atenolol 100 mg daily.  6. Hyoscyamine  daily p.r.n.  7. Indocin probably 1 daily.  8. AcipHex 20 mg p.o. daily.  9. Entocort 6 mg per day.   ALLERGIES:  Codeine and Celebrex.   EXAM:  Well-developed, obese white female in no acute distress.  Blood pressure 140/86, pulse 72, weight 257.  CARDIOVASCULAR:  Regular rate and rhythm with S1, S2.  No murmur, rub,  or gallop.  PULMONARY:  Clear to A and P.  ABDOMEN:  Large, soft, nontender.  There is no palpable mass or  hepatosplenomegaly.  Bowel sounds are active.   IMPRESSION:  A 70 year old white female with chronic diarrhea, suspect  she has underlying collagenous or microscopic colitis that was not  picked up on previous biopsies, as the only medication that has seemed  to benefit her is Entocort.   PLAN:  Schedule colonoscopy with random biopsies and continue Entocort  at current dose, which I believe is 6 mg per day.  Mike Gip, PA-C  Electronically Signed      Vania Rea. Jarold Motto, MD, Caleen Essex, FAGA  Electronically Signed   AE/MedQ  DD: 04/16/2006  DT: 04/16/2006  Job #: [id number]

## 2010-07-02 ENCOUNTER — Telehealth: Payer: Self-pay | Admitting: Family Medicine

## 2010-07-03 NOTE — Telephone Encounter (Signed)
Pt has appt 5/25

## 2010-07-04 ENCOUNTER — Ambulatory Visit (INDEPENDENT_AMBULATORY_CARE_PROVIDER_SITE_OTHER): Payer: Medicare Other | Admitting: Family Medicine

## 2010-07-04 ENCOUNTER — Encounter: Payer: Self-pay | Admitting: Family Medicine

## 2010-07-04 DIAGNOSIS — F3130 Bipolar disorder, current episode depressed, mild or moderate severity, unspecified: Secondary | ICD-10-CM

## 2010-07-04 DIAGNOSIS — F319 Bipolar disorder, unspecified: Secondary | ICD-10-CM

## 2010-07-04 DIAGNOSIS — R259 Unspecified abnormal involuntary movements: Secondary | ICD-10-CM

## 2010-07-04 DIAGNOSIS — R251 Tremor, unspecified: Secondary | ICD-10-CM

## 2010-07-04 DIAGNOSIS — R269 Unspecified abnormalities of gait and mobility: Secondary | ICD-10-CM | POA: Insufficient documentation

## 2010-07-04 MED ORDER — ARIPIPRAZOLE 2 MG PO TABS: 2.0000 mg | ORAL_TABLET | Freq: Every day | ORAL | Status: DC

## 2010-07-04 NOTE — Progress Notes (Signed)
  Subjective:    Patient ID: [name], female    DOB: [birth year], 70 y.o.   MRN: [id number]  HPI Pt here with daughter. C/o of recent fall and shuffling gait.  Pt also now with tremor.  PT felt she had signs of parkinsons'  Pt has not been back to neurologist.   Review of Systems    as above Objective:   Physical Exam  Constitutional: She appears well-developed and well-nourished.  Neurological:       Pt picking up feet well today Walking with cane No tremor now  Psychiatric: She has a normal mood and affect. Her speech is normal and behavior is normal. Thought content normal. Her mood appears not anxious. She does not exhibit a depressed mood.         Assessment & Plan:

## 2010-07-04 NOTE — Assessment & Plan Note (Signed)
Refill abilify  Con't all meds Psych has d/c pt --we can take over meds

## 2010-07-04 NOTE — Progress Notes (Signed)
Addended by: Lelon Perla on: 07/04/2010 03:25 PM   Modules accepted: Orders

## 2010-07-04 NOTE — Assessment & Plan Note (Signed)
?   parkinsons  F/u neuro

## 2010-07-09 NOTE — Telephone Encounter (Signed)
error 

## 2010-07-18 ENCOUNTER — Other Ambulatory Visit: Payer: Self-pay | Admitting: Family Medicine

## 2010-07-18 MED ORDER — CLONAZEPAM 0.5 MG PO TABS: 0.5000 mg | ORAL_TABLET | Freq: Two times a day (BID) | ORAL | Status: DC

## 2010-07-18 MED ORDER — LEVOTHYROXINE SODIUM 125 MCG PO TABS: 125.0000 ug | ORAL_TABLET | Freq: Every day | ORAL | Status: DC

## 2010-07-18 NOTE — Telephone Encounter (Signed)
Last seen 07/04/10 and filled 06/11/10    Please advise   KP

## 2010-07-18 NOTE — Telephone Encounter (Signed)
Refill x1 

## 2010-07-18 NOTE — Telephone Encounter (Signed)
Rx faxed.    KP 

## 2010-07-28 ENCOUNTER — Other Ambulatory Visit: Payer: Self-pay | Admitting: Family Medicine

## 2010-07-30 ENCOUNTER — Other Ambulatory Visit: Payer: Self-pay | Admitting: Family Medicine

## 2010-08-18 ENCOUNTER — Other Ambulatory Visit: Payer: Self-pay | Admitting: Family Medicine

## 2010-08-18 NOTE — Telephone Encounter (Signed)
Last seen 07/04/10 and filled 07/18/10 please advise    KP

## 2010-08-19 NOTE — Telephone Encounter (Signed)
Spoke with patient and she stated she does not see Psych anymore, she has no idea when the last time it was that she seen them. Stated she thought she was finished with the meds, and daughter may have requested the Rx for her. Patient seemed confused so I advised her to call daughter and have her call me, she agreed.     KP

## 2010-08-22 ENCOUNTER — Encounter: Payer: Self-pay | Admitting: Family Medicine

## 2010-08-22 MED ORDER — CLONAZEPAM 0.5 MG PO TABS: 0.5000 mg | ORAL_TABLET | Freq: Two times a day (BID) | ORAL | Status: DC

## 2010-08-22 NOTE — Telephone Encounter (Signed)
Discussed with [name] and she agreed to try Dr.Kaur, she stated she was also a patient of Dr.Kaur. Rx faxed to the pharmacy     KP

## 2010-08-22 NOTE — Telephone Encounter (Signed)
Spoke with [name] and she stated her mother was dismissed from Psych because she was unable to make it to her appointments weekly. She advised they did discuss keeping her on meds  Before she was released but advised the PCP would have to manage those meds. Please advise if you would like to fill Rx      KP

## 2010-08-22 NOTE — Telephone Encounter (Signed)
We will refill but she needs to find a psych because of all of the problems going on----she really needs a psychiatrist---Dr Evelene Croon has weekend and evening hours so that someone could take her

## 2010-08-22 NOTE — Telephone Encounter (Signed)
Patient's daughter Zenaida Niece (is listed on DPR form) called to ask about patient's prescription for Clonazepam--please call her at [phone]

## 2010-08-29 ENCOUNTER — Telehealth: Payer: Self-pay | Admitting: Family Medicine

## 2010-08-29 MED ORDER — ESOMEPRAZOLE MAGNESIUM 40 MG PO CPDR: DELAYED_RELEASE_CAPSULE | ORAL | Status: DC

## 2010-08-29 NOTE — Telephone Encounter (Signed)
Sent to pharm ....

## 2010-09-05 ENCOUNTER — Other Ambulatory Visit: Payer: Self-pay | Admitting: Family Medicine

## 2010-09-05 MED ORDER — ATORVASTATIN CALCIUM 10 MG PO TABS: 10.0000 mg | ORAL_TABLET | Freq: Every day | ORAL | Status: DC

## 2010-09-05 NOTE — Telephone Encounter (Signed)
Refill sent.

## 2010-09-19 ENCOUNTER — Other Ambulatory Visit: Payer: Self-pay | Admitting: Family Medicine

## 2010-09-19 NOTE — Telephone Encounter (Signed)
LOV 07/04/10 Last refill 06/11/10

## 2010-09-22 ENCOUNTER — Other Ambulatory Visit: Payer: Self-pay | Admitting: Family Medicine

## 2010-09-22 NOTE — Telephone Encounter (Signed)
LOV 07/04/10 Last refill- unsure

## 2010-09-24 ENCOUNTER — Other Ambulatory Visit: Payer: Self-pay | Admitting: Family Medicine

## 2010-09-24 NOTE — Telephone Encounter (Signed)
Last filled 08-22-10 #60, last ov 07-04-10

## 2010-09-24 NOTE — Telephone Encounter (Signed)
Refill x1 

## 2010-09-25 ENCOUNTER — Other Ambulatory Visit: Payer: Self-pay | Admitting: Family Medicine

## 2010-09-25 MED ORDER — CLONAZEPAM 0.5 MG PO TABS: 0.5000 mg | ORAL_TABLET | Freq: Two times a day (BID) | ORAL | Status: DC

## 2010-09-25 NOTE — Telephone Encounter (Signed)
Rx sent 

## 2010-09-26 ENCOUNTER — Other Ambulatory Visit: Payer: Self-pay | Admitting: Family Medicine

## 2010-09-26 MED ORDER — OLMESARTAN MEDOXOMIL 20 MG PO TABS: 20.0000 mg | ORAL_TABLET | Freq: Every day | ORAL | Status: DC

## 2010-09-26 NOTE — Telephone Encounter (Signed)
Rx sent 

## 2010-10-01 ENCOUNTER — Other Ambulatory Visit: Payer: Self-pay | Admitting: Family Medicine

## 2010-10-01 MED ORDER — METFORMIN HCL ER 500 MG PO TB24: 1000.0000 mg | ORAL_TABLET | Freq: Every evening | ORAL | Status: DC

## 2010-10-01 NOTE — Telephone Encounter (Signed)
Rx Faxed    KP 

## 2010-10-06 ENCOUNTER — Other Ambulatory Visit: Payer: Self-pay | Admitting: Family Medicine

## 2010-10-06 MED ORDER — HYDROCHLOROTHIAZIDE 25 MG PO TABS: 25.0000 mg | ORAL_TABLET | Freq: Every day | ORAL | Status: DC

## 2010-10-06 NOTE — Telephone Encounter (Signed)
Rx faxed.    KP 

## 2010-10-15 ENCOUNTER — Telehealth: Payer: Self-pay | Admitting: Family Medicine

## 2010-10-15 NOTE — Telephone Encounter (Signed)
Pt seeing neuro for falls----he needs to know she is falling---please let neuro know PT said she is falling.  Also psychiatrist needs note from Neuro from 08/2010.----I'm not sure who she is seeing now.

## 2010-10-15 NOTE — Telephone Encounter (Signed)
Scott - physical theripist - wanted to let dr Laury Axon know patient fell twice - he knows it isnt her balance - he checked her balance it is normal - patient is going to the beach for the weekend - fust an [place]

## 2010-10-15 NOTE — Telephone Encounter (Signed)
FYI

## 2010-10-20 NOTE — Telephone Encounter (Signed)
msgs left to call the office    KP

## 2010-10-22 ENCOUNTER — Other Ambulatory Visit: Payer: Self-pay | Admitting: Family Medicine

## 2010-10-22 NOTE — Telephone Encounter (Signed)
Discussed with patient and she stated she had appointments on Thursday and Friday and with Psych an Neuro and she is aware to have office notes sent to the Psych.     KP

## 2010-10-22 NOTE — Telephone Encounter (Signed)
Last seen 07/04/10 and filled 09/25/10 please advise    KP

## 2010-10-23 MED ORDER — CLONAZEPAM 0.5 MG PO TABS: 0.5000 mg | ORAL_TABLET | Freq: Two times a day (BID) | ORAL | Status: DC

## 2010-10-23 NOTE — Telephone Encounter (Signed)
Faxed.   KP 

## 2010-11-06 LAB — POCT I-STAT, CHEM 8
BUN: 12
Calcium, Ion: 1.13
Creatinine, Ser: 0.9
Glucose, Bld: 131 — ABNORMAL HIGH
HCT: 42
Hemoglobin: 14.3
Hemoglobin: 15
Potassium: 3.6
Sodium: 136
TCO2: 25

## 2010-11-06 LAB — HEPATIC FUNCTION PANEL
ALT: 18
Albumin: 3.6
Alkaline Phosphatase: 78
Total Protein: 6.3

## 2010-11-06 LAB — POCT CARDIAC MARKERS
CKMB, poc: <1 — ABNORMAL LOW
Troponin i, poc: <0.05

## 2010-11-06 LAB — BASIC METABOLIC PANEL
CO2: 28
Chloride: 102
Glucose, Bld: 128 — ABNORMAL HIGH
Potassium: 3.7
Sodium: 141

## 2010-11-06 LAB — URINALYSIS, ROUTINE W REFLEX MICROSCOPIC
Bilirubin Urine: NEGATIVE
Nitrite: NEGATIVE
Specific Gravity, Urine: 1.019
Urobilinogen, UA: 0.2

## 2010-11-06 LAB — URINE CULTURE

## 2010-11-06 LAB — CARDIAC PANEL(CRET KIN+CKTOT+MB+TROPI)
CK, MB: 0.5
Relative Index: INVALID
Total CK: 63
Total CK: 74
Troponin I: 0.01

## 2010-11-10 LAB — BASIC METABOLIC PANEL
CO2: 26
Chloride: 106
Creatinine, Ser: 0.98
GFR calc Af Amer: >60
Potassium: 3.9
Sodium: 140

## 2010-11-10 LAB — CSF CULTURE W GRAM STAIN: Culture: NO GROWTH

## 2010-11-10 LAB — URINALYSIS, ROUTINE W REFLEX MICROSCOPIC
Bilirubin Urine: NEGATIVE
Hgb urine dipstick: NEGATIVE
Ketones, ur: 15 — AB
Ketones, ur: NEGATIVE
Nitrite: NEGATIVE
Protein, ur: NEGATIVE
Urobilinogen, UA: 0.2
Urobilinogen, UA: 0.2

## 2010-11-10 LAB — GLUCOSE, CAPILLARY

## 2010-11-10 LAB — CULTURE, BLOOD (ROUTINE X 2): Culture: NO GROWTH

## 2010-11-10 LAB — RPR: RPR Ser Ql: NONREACTIVE

## 2010-11-10 LAB — LIPID PANEL
Cholesterol: 190
LDL Cholesterol: 124 — ABNORMAL HIGH
Triglycerides: 115

## 2010-11-10 LAB — COMPREHENSIVE METABOLIC PANEL
ALT: 23
AST: 21
Albumin: 3.7
CO2: 24
Calcium: 9
Creatinine, Ser: 0.89
GFR calc Af Amer: >60
GFR calc non Af Amer: >60
Sodium: 135
Total Protein: 6.1

## 2010-11-10 LAB — DIFFERENTIAL
Eosinophils Absolute: 0
Eosinophils Relative: 0
Lymphocytes Relative: 18
Lymphs Abs: 1.6
Monocytes Absolute: 1.4 — ABNORMAL HIGH
Monocytes Relative: 16 — ABNORMAL HIGH

## 2010-11-10 LAB — TSH: TSH: 1.712

## 2010-11-10 LAB — HSV PCR: HSV, PCR: NOT DETECTED

## 2010-11-10 LAB — CBC
HCT: 41.4
HCT: 45.1
Hemoglobin: 14.1
Hemoglobin: 15.2 — ABNORMAL HIGH
MCHC: 33.5
MCHC: 33.7
MCV: 97.3
MCV: 97.8
Platelets: 248
Platelets: 301
RBC: 4.58
RBC: 4.61
RDW: 14.4

## 2010-11-10 LAB — CSF CELL COUNT WITH DIFFERENTIAL: Tube #: 3

## 2010-11-10 LAB — URINE CULTURE
Colony Count: NO GROWTH
Culture: NO GROWTH

## 2010-11-10 LAB — RAPID URINE DRUG SCREEN, HOSP PERFORMED
Amphetamines: NOT DETECTED
Barbiturates: NOT DETECTED
Benzodiazepines: POSITIVE — AB
Cocaine: NOT DETECTED

## 2010-11-10 LAB — GRAM STAIN

## 2010-11-10 LAB — FOLATE RBC: RBC Folate: 1381 — ABNORMAL HIGH

## 2010-11-10 LAB — ETHANOL: Alcohol, Ethyl (B): <5

## 2010-11-10 LAB — URINE MICROSCOPIC-ADD ON

## 2010-11-10 LAB — HIV ANTIBODY (ROUTINE TESTING W REFLEX): HIV: NONREACTIVE

## 2010-11-16 ENCOUNTER — Other Ambulatory Visit: Payer: Self-pay | Admitting: Family Medicine

## 2010-11-17 ENCOUNTER — Other Ambulatory Visit: Payer: Self-pay

## 2010-11-17 MED ORDER — LEVOTHYROXINE SODIUM 125 MCG PO TABS: ORAL_TABLET | ORAL | Status: DC

## 2010-11-22 ENCOUNTER — Other Ambulatory Visit: Payer: Self-pay | Admitting: Family Medicine

## 2010-11-24 ENCOUNTER — Other Ambulatory Visit: Payer: Self-pay | Admitting: Family Medicine

## 2010-11-24 LAB — D-DIMER, QUANTITATIVE: D-Dimer, Quant: 0.35

## 2010-11-24 NOTE — Telephone Encounter (Signed)
Ok to Rf? Last OV 06/2010 and no exiting appt.

## 2010-12-03 ENCOUNTER — Other Ambulatory Visit: Payer: Self-pay | Admitting: Family Medicine

## 2010-12-03 MED ORDER — METFORMIN HCL ER 500 MG PO TB24: 1000.0000 mg | ORAL_TABLET | Freq: Every evening | ORAL | Status: DC

## 2010-12-03 NOTE — Telephone Encounter (Signed)
Faxed     Kp 

## 2010-12-15 ENCOUNTER — Other Ambulatory Visit: Payer: Self-pay | Admitting: Family Medicine

## 2010-12-15 ENCOUNTER — Emergency Department (INDEPENDENT_AMBULATORY_CARE_PROVIDER_SITE_OTHER): Payer: Medicare Other

## 2010-12-15 ENCOUNTER — Emergency Department (HOSPITAL_BASED_OUTPATIENT_CLINIC_OR_DEPARTMENT_OTHER)
Admission: EM | Admit: 2010-12-15 | Discharge: 2010-12-15 | Disposition: A | Discharge status: Home / Self Care | Payer: Medicare Other | Attending: Emergency Medicine | Admitting: Emergency Medicine

## 2010-12-15 ENCOUNTER — Encounter (HOSPITAL_BASED_OUTPATIENT_CLINIC_OR_DEPARTMENT_OTHER): Payer: Self-pay | Admitting: *Deleted

## 2010-12-15 DIAGNOSIS — G319 Degenerative disease of nervous system, unspecified: Secondary | ICD-10-CM

## 2010-12-15 DIAGNOSIS — R4182 Altered mental status, unspecified: Secondary | ICD-10-CM

## 2010-12-15 DIAGNOSIS — I10 Essential (primary) hypertension: Secondary | ICD-10-CM | POA: Insufficient documentation

## 2010-12-15 DIAGNOSIS — K219 Gastro-esophageal reflux disease without esophagitis: Secondary | ICD-10-CM | POA: Insufficient documentation

## 2010-12-15 DIAGNOSIS — R300 Dysuria: Secondary | ICD-10-CM

## 2010-12-15 DIAGNOSIS — Z79899 Other long term (current) drug therapy: Secondary | ICD-10-CM | POA: Insufficient documentation

## 2010-12-15 DIAGNOSIS — R609 Edema, unspecified: Secondary | ICD-10-CM | POA: Insufficient documentation

## 2010-12-15 DIAGNOSIS — M810 Age-related osteoporosis without current pathological fracture: Secondary | ICD-10-CM | POA: Insufficient documentation

## 2010-12-15 DIAGNOSIS — I679 Cerebrovascular disease, unspecified: Secondary | ICD-10-CM

## 2010-12-15 LAB — COMPREHENSIVE METABOLIC PANEL
ALT: 24 U/L (ref 0–35)
Alkaline Phosphatase: 74 U/L (ref 39–117)
BUN: 18 mg/dL (ref 6–23)
CO2: 30 mEq/L (ref 19–32)
Chloride: 100 mEq/L (ref 96–112)
GFR calc Af Amer: 64 mL/min — ABNORMAL LOW (ref >90)
Glucose, Bld: 129 mg/dL — ABNORMAL HIGH (ref 70–99)
Potassium: 3.8 mEq/L (ref 3.5–5.1)
Sodium: 141 mEq/L (ref 135–145)
Total Bilirubin: 0.4 mg/dL (ref 0.3–1.2)

## 2010-12-15 LAB — CBC
HCT: 40.6 % (ref 36.0–46.0)
Hemoglobin: 14 g/dL (ref 12.0–15.0)
MCH: 31.9 pg (ref 26.0–34.0)
MCV: 92.5 fL (ref 78.0–100.0)
Platelets: 281 10*3/uL (ref 150–400)
RBC: 4.39 MIL/uL (ref 3.87–5.11)
WBC: 9.6 10*3/uL (ref 4.0–10.5)

## 2010-12-15 LAB — DIFFERENTIAL
Eosinophils Absolute: 0.2 10*3/uL (ref 0.0–0.7)
Eosinophils Relative: 2 % (ref 0–5)
Lymphocytes Relative: 31 % (ref 12–46)
Lymphs Abs: 3 10*3/uL (ref 0.7–4.0)
Monocytes Absolute: 1.1 10*3/uL — ABNORMAL HIGH (ref 0.1–1.0)
Monocytes Relative: 12 % (ref 3–12)

## 2010-12-15 LAB — URINALYSIS, ROUTINE W REFLEX MICROSCOPIC
Glucose, UA: NEGATIVE mg/dL
Specific Gravity, Urine: 1.025 (ref 1.005–1.030)
Urobilinogen, UA: 0.2 mg/dL (ref 0.0–1.0)
pH: 5.5 (ref 5.0–8.0)

## 2010-12-15 LAB — URINE MICROSCOPIC-ADD ON

## 2010-12-15 MED ORDER — SODIUM CHLORIDE 0.9 % IV BOLUS (SEPSIS)
500.0000 mL | Freq: Once | INTRAVENOUS | Status: AC
  Administered 2010-12-15: 500 mL via INTRAVENOUS

## 2010-12-15 NOTE — ED Notes (Signed)
I/O cath to obtain UA.  Patient tolerated well.  Bed returned to the lowest position and rails up; call light within reach.

## 2010-12-15 NOTE — ED Notes (Signed)
Attempted in/out cath without success. Pt had no urine output. Pt states she does not feel as if she needs to urinate. Abdomen nondistended

## 2010-12-15 NOTE — ED Provider Notes (Signed)
History    Scribed for Geoffery Lyons, MD, the patient was seen in room MH06/MH06. This chart was scribed by Katha Cabal.   CSN: [id number] Arrival date & time: 12/15/2010  6:31 PM   First MD Initiated Contact with Patient 12/15/10 1900      Chief Complaint  Patient presents with  . Altered Mental Status  . Dysuria    (Consider location/radiation/quality/duration/timing/severity/associated sxs/prior treatment) HPI [name] is a 70 y.o. female who presents to the Emergency Department complaining of persistent moderate difficulty voiding with associated urinary frequency, intermittent back pain and dizziness today.  Episode is unchanged.  Patient states urine "dribbles".  Patient takes tramadol for back pain.  Daughter reports baseline confusion and unsteady gait but states worsening of unsteady gait and confusion today.  Daughter states that SNF called her with concerns of patient confusion.  Patient resides at [facility] in [place].  Patient pulled fire alarm today at SNF. Symptoms relieved by nothing.  Daughter reports patient averages one fall a week.  Patient in 2006/07/20 had swelling of brain and was evaluated in step down intensive care at [facility].  Patient was discharged about a month later. Patient has hx of chronic back pain, AMS, DM, and UTI.     PCP Loreen Freud, DO, DO   Past Medical History  Diagnosis Date  . Genital herpes, unspecified   . Abdominal pain, unspecified site   . Other specified intestinal malabsorption   . Esophageal reflux   . Obstructive sleep apnea (adult) (pediatric)      - PSG 09/05/09 AHI 14.2  . Memory loss   . Backache, unspecified   . Benign neoplasm of skin, site unspecified   . Tachycardia, unspecified   . Altered mental status   . Sinus tachycardia   . Anxiety   . Circadian rhythm sleep disorder, delayed sleep phase type   . Restless legs syndrome (RLS)   . Insomnia, unspecified   . Urinary tract infection, site not  specified   . Concussion with no loss of consciousness   . Generalized osteoarthrosis, unspecified site   . Duodenitis without mention of hemorrhage   . Diaphragmatic hernia without mention of obstruction or gangrene   . Personal history of colonic polyps   . Diverticulosis of colon (without mention of hemorrhage)   . Diarrhea   . Allergic rhinitis due to pollen   . Viral meningitis   . Delirium   . DM II (diabetes mellitus, type II), controlled   . Other and unspecified hyperlipidemia   . Mole (skin)   . 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 Osteoporosis, unspecified   . Shingles   . Routine general medical examination at a health care facility   . Impacted cerumen   . Unspecified fall   . Other abnormal blood chemistry   . Family history of diabetes mellitus   . Family history of psychiatric condition   . Shortness of breath   . Bilateral leg edema   . Obsessive-compulsive disorders   . Unspecified hypothyroidism   . Unspecified essential hypertension   . Gout, unspecified     Past Surgical History  Procedure Date  . Carpal tunnel release 07-20-03  . Nasal sinus surgery 2000  . Colonoscopy 1991, 3.17.2008  . Laminectomy 11.15.07    L3-L4    Family History  Problem Relation Age of Onset  . Alzheimer's disease Mother 18    died 2006-07-20  History  Substance Use Topics  . Smoking status: Never Smoker   . Smokeless tobacco: Never Used  . Alcohol Use: No    OB History    Grav Para Term Preterm Abortions TAB SAB Ect Mult Living                  Review of Systems  All other systems reviewed and are negative.    Allergies  Codeine  Home Medications   Current Outpatient Rx  Name Route Sig Dispense Refill  . ALENDRONATE SODIUM 70 MG PO TABS       . ALLOPURINOL 100 MG PO TABS Oral Take 100 mg by mouth daily.      Marland Kitchen AMLODIPINE BESYLATE 5 MG PO TABS       . ARIPIPRAZOLE 2 MG PO TABS Oral Take 2 mg by mouth at bedtime.      . ASPIRIN EC 81 MG  PO TBEC Oral Take 81 mg by mouth daily.      . ATORVASTATIN CALCIUM 10 MG PO TABS Oral Take 1 tablet (10 mg total) by mouth daily. 30 tablet 3  . BIOTIN 10 MG PO TABS  Take 1 tablet by mouth once daily     . CELEBREX 200 MG PO CAPS  TAKE 1 CAPSULE EVERY DAY 30 capsule 5  . CHOLESTYRAMINE 4 G PO PACK Oral Take 1 packet by mouth daily.      Marland Kitchen CLONAZEPAM 0.5 MG PO TABS Oral Take 1 tablet (0.5 mg total) by mouth 2 (two) times daily. 60 tablet 0  . CLOTRIMAZOLE-BETAMETHASONE 1-0.05 % EX CREA  APLLY TWICE A DAY AS DIRECTED 30 g 0  . ESOMEPRAZOLE MAGNESIUM 40 MG PO CPDR  1 capsule by mouth 30 minutes before first meal of the day 30 capsule 4  . FEXOFENADINE HCL 180 MG PO TABS Oral Take 180 mg by mouth daily.      Marland Kitchen HYDROCHLOROTHIAZIDE 25 MG PO TABS Oral Take 1 tablet (25 mg total) by mouth daily. 100 tablet 0    Physical Exam due now  . LEVOTHYROXINE SODIUM 125 MCG PO TABS  1 TABLET BY MOUTH DAILY---REPEAT LABS ARE DUE NOW 30 tablet 0    DO NOT DENY BLOOD WORK TO FOLLOW FRI  . LEXAPRO 20 MG PO TABS Oral Take 30 mg by mouth daily.     Marland Kitchen METFORMIN HCL ER 500 MG PO TB24 Oral Take 2 tablets (1,000 mg total) by mouth every evening. 60 tablet 0    Repeat labs are due  . MOMETASONE FUROATE 50 MCG/ACT NA SUSP  2 spray in each nostril once daily     . MULTIVITAMINS PO TABS Oral Take 1 tablet by mouth daily.      Marland Kitchen OLMESARTAN MEDOXOMIL 20 MG PO TABS Oral Take 1 tablet (20 mg total) by mouth daily. 30 tablet 2  . TRAMADOL HCL 50 MG PO TABS Oral Take 50-100 mg by mouth every 6 (six) hours as needed. For pain    . VITAMIN E 400 UNITS PO CAPS Oral Take 400 Units by mouth daily.      . ALLOPURINOL 300 MG PO TABS Oral Take 1 tablet (300 mg total) by mouth daily. 30 tablet 1  . COLCHICINE 0.6 MG PO TABS Oral Take 0.6 mg by mouth daily as needed. For gout    . COLESTIPOL HCL 1 G PO TABS Oral Take 1 g by mouth daily.      Marland Kitchen DIAZEPAM 5 MG PO TABS Oral  Take 5 mg by mouth every 6 (six) hours as needed.      .  OXYCODONE-ACETAMINOPHEN 5-325 MG PO TABS Oral Take 1 tablet by mouth every 4 (four) hours as needed.        BP 115/75  Pulse 108  Temp(Src) 98.8 F (37.1 C) (Oral)  Resp 18  SpO2 94%  Physical Exam  Constitutional: She is oriented to person, place, and time. She appears well-developed and well-nourished.  HENT:  Head: Normocephalic and atraumatic.  Eyes: Conjunctivae and EOM are normal. Pupils are equal, round, and reactive to light.  Neck: Normal range of motion. Neck supple.  Cardiovascular: Normal rate and regular rhythm.   Pulmonary/Chest: Effort normal and breath sounds normal. No respiratory distress.  Abdominal: Soft. There is no tenderness. There is no rebound and no guarding.  Musculoskeletal: Normal range of motion. She exhibits no edema.  Neurological: She is alert and oriented to person, place, and time. No cranial nerve deficit.       Oriented x4  Skin: Skin is warm and dry. No rash noted.  Psychiatric: She has a normal mood and affect. Her behavior is normal.    ED Course  Procedures (including critical care time)   DIAGNOSTIC STUDIES: Oxygen Saturation is 94% on room air, adequate by my interpretation.    COORDINATION OF CARE:  7:19 PM  Physical exam complete.  Will order Head CT and labs.    Orders Placed This Encounter  Procedures  . CT Head Wo Contrast  . Urinalysis with microscopic  . CBC  . Differential  . Comprehensive metabolic panel  . Urine microscopic-add on     LABS / RADIOLOGY:  Labs Reviewed  URINALYSIS, ROUTINE W REFLEX MICROSCOPIC - Abnormal; Notable for the following:    Color, Urine AMBER (*) BIOCHEMICALS MAY BE AFFECTED BY COLOR   Hgb urine dipstick MODERATE (*)    Bilirubin Urine SMALL (*)    Ketones, ur 15 (*)    All other components within normal limits  DIFFERENTIAL - Abnormal; Notable for the following:    Monocytes Absolute 1.1 (*)    All other components within normal limits  COMPREHENSIVE METABOLIC PANEL - Abnormal;  Notable for the following:    Glucose, Bld 129 (*)    GFR calc non Af Amer 55 (*)    GFR calc Af Amer 64 (*)    All other components within normal limits  CBC  URINE MICROSCOPIC-ADD ON   Ct Head Wo Contrast  12/15/2010  *RADIOLOGY REPORT*  Clinical Data: Altered mental status.  CT HEAD WITHOUT CONTRAST  Technique:  Contiguous axial images were obtained from the base of the skull through the vertex without contrast.  Comparison: 10/24/2009  Findings: Global atrophy and chronic ischemic changes are stable. No mass effect, midline shift, or acute intracranial hemorrhage. Mastoid air cells and visualized paranasal sinuses are clear. Calvarium is intact.  IMPRESSION: No acute intracranial pathology.  Chronic changes.  Original Report Authenticated By: Donavan Burnet, M.D.         MDM   MDM: No cause for mental status change found and no evidence of uti.  Will discharge, to return prn.     MEDICATIONS GIVEN IN THE E.D. Scheduled Meds:    . sodium chloride  500 mL Intravenous Once   Continuous Infusions:      IMPRESSION: No diagnosis found.     I personally performed the services described in this documentation, which was scribed in my presence. The recorded  information has been reviewed and considered.   Scribe            Geoffery Lyons, MD 12/15/10 [id number]

## 2010-12-15 NOTE — ED Notes (Addendum)
Daughter sts she received a call today that pt was more confused today than normal. Pt had set fire alarm off at the SNF when trying to open a door for a family member. Pt reports she has had urinary frequency today and has "been going every 5 minutes" and reports an odor to her urine. Pt has no slurred speech, no facial droop, equal grips and no pronator drift.

## 2010-12-18 ENCOUNTER — Encounter: Payer: Self-pay | Admitting: Family Medicine

## 2010-12-19 ENCOUNTER — Encounter: Payer: Self-pay | Admitting: Family Medicine

## 2010-12-19 ENCOUNTER — Ambulatory Visit (INDEPENDENT_AMBULATORY_CARE_PROVIDER_SITE_OTHER): Payer: Medicare Other | Admitting: Family Medicine

## 2010-12-19 VITALS — BP 118/80 | HR 94 | Temp 98.5°F | Wt 221.6 lb

## 2010-12-19 DIAGNOSIS — R279 Unspecified lack of coordination: Secondary | ICD-10-CM

## 2010-12-19 DIAGNOSIS — G4733 Obstructive sleep apnea (adult) (pediatric): Secondary | ICD-10-CM

## 2010-12-19 DIAGNOSIS — I10 Essential (primary) hypertension: Secondary | ICD-10-CM

## 2010-12-19 DIAGNOSIS — E039 Hypothyroidism, unspecified: Secondary | ICD-10-CM

## 2010-12-19 DIAGNOSIS — R413 Other amnesia: Secondary | ICD-10-CM

## 2010-12-19 DIAGNOSIS — E119 Type 2 diabetes mellitus without complications: Secondary | ICD-10-CM

## 2010-12-19 DIAGNOSIS — F0390 Unspecified dementia without behavioral disturbance: Secondary | ICD-10-CM

## 2010-12-19 DIAGNOSIS — E785 Hyperlipidemia, unspecified: Secondary | ICD-10-CM

## 2010-12-19 DIAGNOSIS — W19XXXA Unspecified fall, initial encounter: Secondary | ICD-10-CM

## 2010-12-19 DIAGNOSIS — R296 Repeated falls: Secondary | ICD-10-CM

## 2010-12-19 LAB — CBC WITH DIFFERENTIAL/PLATELET
Basophils Absolute: 0 10*3/uL (ref 0.0–0.1)
Eosinophils Absolute: 0.3 10*3/uL (ref 0.0–0.7)
HCT: 42.7 % (ref 36.0–46.0)
Hemoglobin: 14.4 g/dL (ref 12.0–15.0)
Lymphocytes Relative: 30.9 % (ref 12.0–46.0)
Lymphs Abs: 2.7 10*3/uL (ref 0.7–4.0)
MCHC: 33.7 g/dL (ref 30.0–36.0)
Monocytes Relative: 9.1 % (ref 3.0–12.0)
Neutro Abs: 4.9 10*3/uL (ref 1.4–7.7)
Platelets: 298 10*3/uL (ref 150.0–400.0)
RDW: 13.9 % (ref 11.5–14.6)

## 2010-12-19 LAB — HEPATIC FUNCTION PANEL
AST: 30 U/L (ref 0–37)
Albumin: 3.9 g/dL (ref 3.5–5.2)
Total Bilirubin: 0.6 mg/dL (ref 0.3–1.2)

## 2010-12-19 LAB — LIPID PANEL
Cholesterol: 154 mg/dL (ref 0–200)
HDL: 60.7 mg/dL (ref >39.00)
Total CHOL/HDL Ratio: 3
VLDL: 43.8 mg/dL — ABNORMAL HIGH (ref 0.0–40.0)

## 2010-12-19 LAB — LDL CHOLESTEROL, DIRECT: Direct LDL: 79.9 mg/dL

## 2010-12-19 LAB — POCT URINALYSIS DIPSTICK
Blood, UA: NEGATIVE
Nitrite, UA: NEGATIVE
Protein, UA: NEGATIVE
Spec Grav, UA: 1.025
Urobilinogen, UA: 0.2
pH, UA: 5

## 2010-12-19 LAB — BASIC METABOLIC PANEL
BUN: 14 mg/dL (ref 6–23)
CO2: 24 mEq/L (ref 19–32)
Calcium: 9 mg/dL (ref 8.4–10.5)
GFR: 58.49 mL/min — ABNORMAL LOW (ref >60.00)
Glucose, Bld: 117 mg/dL — ABNORMAL HIGH (ref 70–99)

## 2010-12-19 LAB — HEMOGLOBIN A1C: Hgb A1c MFr Bld: 6.5 % (ref 4.6–6.5)

## 2010-12-19 MED ORDER — CHOLESTYRAMINE 4 G PO PACK: 1.0000 | PACK | Freq: Every day | ORAL | Status: DC

## 2010-12-19 MED ORDER — RIVASTIGMINE 4.6 MG/24HR TD PT24: 1.0000 | MEDICATED_PATCH | Freq: Every day | TRANSDERMAL | Status: DC

## 2010-12-19 NOTE — Assessment & Plan Note (Signed)
Check labs 

## 2010-12-19 NOTE — Assessment & Plan Note (Addendum)
exelon patch  Refer to neuro con't psych I had long discussion with pt and her daughter---Pt lives alone---I recommended assisted living for her.  Pt is reluctant but is willing to look.  Her daughter feels relieved--her and her brother have been talking about it for a while.

## 2010-12-19 NOTE — Assessment & Plan Note (Signed)
Stable con't meds 

## 2010-12-19 NOTE — Progress Notes (Signed)
  Subjective:    Patient ID: [name], female    DOB: 04/23/1940, 70 y.o.   MRN: [id number]  HPI Pt here with her daughter c/o increased falling and confusion.  They would like a new neurologist because Pt did not like the one she saw previously.     Review of Systems    as above Objective:   Physical Exam  Constitutional: She appears well-developed and well-nourished.  Cardiovascular: Normal rate and regular rhythm.   Pulmonary/Chest: Effort normal and breath sounds normal. No respiratory distress. She has no wheezes. She has no rales. She exhibits no tenderness.  Musculoskeletal: Normal range of motion.  Neurological: She is alert. No cranial nerve deficit.       Pt did score 28/30 on MMSE but made up a false reason as to why she was in the ER last week,----- she has been falling a lot and was very confused so she went to ER and was d/c'd.     Psychiatric: She has a normal mood and affect. Her behavior is normal.  ext -- Sensory exam of the foot is normal, tested with the monofilament. Good pulses, no lesions or ulcers, good peripheral pulses.        Assessment & Plan:

## 2010-12-19 NOTE — Patient Instructions (Signed)
Home Safety and Preventing Falls °Falls are a leading cause of injury and while they affect all age groups, falls have greater short-term and long-term impact on older age groups. However, falls should not be a part of life or aging. It is possible for individuals and their families to use preventive measures to significantly decrease the likelihood that anyone, especially an older adult, will fall. °There are many simple measures which can make your home safer with respect to preventing falls. The following actions can help reduce falls among all members of your family and are especially important as you age, when your balance, lower limb strength, coordination, and eyesight may be declining. The use of preventive measures will help to reduce you and your family's risk of falls and serious medical consequences. °OUTDOORS °· Repair cracks and edges of walkways and driveways.  °· Remove high doorway thresholds and trim shrubbery on the main path into your home.  °· Ensure there is good outside lighting at main entrances and along main walkways.  °· Clear walkways of tools, rocks, debris, and clutter.  °· Check that handrails are not broken and are securely fastened. Both sides of steps should have handrails.  °· In the garage, be attentive to and clean up grease or oil spills on the cement. This can make the surface extremely slippery.  °· In winter, have leaves, snow, and ice cleared regularly.  °· Use sand or salt on walkways during winter months.  °BATHROOM °· Install grab bars by the toilet and in the tub and shower.  °· Use non-skid mats or decals in the tub or shower.  °· If unable to easily stand unsupported while showering, place a plastic non slip stool in the shower to sit on when needed.  °· Install night lights.  °· Keep floors dry and clean up all water on the floor immediately.  °· Remove soap buildup in tub or shower on a regular basis.  °· Secure bath mats with non-slip, double-sided rug tape.   °· Remove tripping hazards from the floors.  °BEDROOMS °· Install night lights.  °· Do not use oversized bedding.  °· Make sure a bedside light is easy to reach.  °· Keep a telephone by your bedside.  °· Make sure that you can get in and out of your bed easily.  °· Have a firm chair, with side arms, to use for getting dressed.  °· Remove clutter from around closets.  °· Store clothing, bed coverings, and other household items where you can reach them comfortably.  °· Remove tripping hazards from the floor.  °LIVING AREAS AND STAIRWAYS °· Turn on lights to avoid having to walk through dark areas.  °· Keep lighting uniform in each room. Place brighter lightbulbs in darker areas, including stairways.  °· Replace lightbulbs that burn out in stairways immediately.  °· Arrange furniture to provide for clear pathways.  °· Keep furniture in the same place.  °· Eliminate or tape down electrical cables in high traffic areas.  °· Place handrails on both sides of stairways. Use handrails when going up or down stairs.  °· Most falls occur on the top or bottom 3 steps.  °· Fix any loose handrails. Make sure handrails on both sides of the stairways are as long as the stairs.  °· Remove all walkway obstacles.  °· Coil or tape electrical cords off to the side of walking areas and out of the way. If using many extension cords, have an electrician   put in a new wall outlet to reduce or eliminate them.  °· Make sure spills are cleaned up quickly and allow time for drying before walking on freshly cleaned floors.  °· Firmly attach carpet with non-skid or two-sided tape.  °· Keep frequently used items within easy reach.  °· Remove tripping hazards such as throw rugs and clutter in walkways. Never leave objects on stairs.  °· Get rid of throw rugs elsewhere if possible.  °· Eliminate uneven floor surfaces.  °· Make sure couches and chairs are easy to get into and out of.  °· Check carpeting to make sure it is firmly attached along stairs.   °· Make repairs to worn or loose carpet promptly.  °· Select a carpet pattern that does not visually hide the edge of steps.  °· Avoid placing throw rugs or scatter rugs at the top or bottom of stairways, or properly secure with carpet tape to prevent slippage.  °· Have an electrician put in a light switch at the top and bottom of the stairs.  °· Get light switches that glow.  °· Avoid the following practices: hurrying, inattention, obscured vision, carrying large loads, and wearing slip-on shoes.  °· Be aware of all pets.  °KITCHEN °· Place items that are used frequently, such as dishes and food, within easy reach.  °· Keep handles on pots and pans toward the center of the stove. Use back burners when possible.  °· Make sure spills are cleaned up quickly and allow time for drying.  °· Avoid walking on wet floors.  °· Avoid hot utensils and knives.  °· Position shelves so they are not too high or low.  °· Place commonly used objects within easy reach.  °· If necessary, use a sturdy step stool with a grab bar when reaching.  °· Make sure electrical cables are out of the way.  °· Do not use floor polish or wax that makes floors slippery.  °OTHER HOME FALL PREVENTION STRATEGIES °· Wear low heel or rubber sole shoes that are supportive and fit well.  °· Wear closed toe shoes.  °· Know and watch for side effects of medications. Have your caregiver or pharmacist look at all your medicines, even over-the-counter medicines. Some medicines can make you sleepy or dizzy.  °· Exercise regularly. Exercise makes you stronger and improves your balance and coordination.  °· Limit use of alcohol.  °· Use eyeglasses if necessary and keep them clean. Have your vision checked every year.  °· Organize your household in a manner that minimizes the need to walk distances when hurried, or go up and down stairs unnecessarily. For example, have a phone placed on at least each floor of your home. If possible, have a phone beside each sitting  or lying area where you spend the most time at home. Keep emergency numbers posted at all phones.  °· Use non-skid floor wax.  °· When using a ladder, make sure:  °· The base is firm.  °· All ladder feet are on level ground.  °· The ladder is angled against the wall properly.  °· When climbing a ladder, face the ladder and hold the ladder rungs firmly.  °· If reaching, always keep your hips and body weight centered between the rails.  °· When using a stepladder, make sure it is fully opened and both spreaders are firmly locked.  °· Do not climb a closed stepladder.  °· Avoid climbing beyond the second step from the top   of a stepladder and the 4th rung from the top of an extension ladder.  °· Learn and use mobility aids as needed.  °· Change positions slowly. Arise slowly from sitting and lying positions. Sit on the edge of your bed before getting to your feet.  °· If you have a history of falls, ask someone to add color or contrast paint or tape to grab bars and handrails in your home.  °· If you have a history of falls, ask someone to place contrasting color strips on first and last steps.  °· Install an electrical emergency response system if you need one, and know how to use it.  °· If you have a medical or other condition that causes you to have limited physical strength, it is important that you reach out to family and friends for occasional help.  °FOR CHILDREN: °· If young children are in the home, use safety gates. At the top of stairs use screw-mounted gates; use pressure-mounted gates for the bottom of the stairs and doorways between rooms.  °· Young children should be taught to descend stairs on their stomachs, feet first, and later using the handrail.  °· Keep drawers fully closed to prevent them from being climbed on or pulled out entirely.  °· Move chairs, cribs, beds and other furniture away from windows.  °· Consider installing window guards on windows ground floor and up, unless they are emergency  fire exits. Make sure they have easy release mechanisms.  °· Consider installing special locks that only allow the window to be opened to a certain height.  °· Never rely on window screens to prevent falls.  °· Never leave babies alone on changing tables, beds or sofas. Use a changing table that has a restraining strap.  °· When a child can pull to a standing position, the crib mattress should be adjusted to its lowest position. There should be at least 26 inches between the top rails of the crib drop side and the mattress. Toys, bumper pads, and other objects that can be used as steps to climb out should be removed from the crib.  °· On bunk beds never allow a child under age 6 to sleep on the top bunk. For older children, if the upper bunk is not against a wall, use guard rails on both sides. No matter how old a child is, keep the guard rails in place on the top bunk since children roll during sleep. Do not permit horseplay on bunks.  °· Grass and soil surfaces beneath backyard playground equipment should be replaced with hardwood chips, shredded wood mulch, sand, pea gravel, rubber, crushed stone, or another safer material at depths of at least 9 to 12 inches.  °· When riding bikes or using skates, skateboards, skis, or snowboards, require children to wear helmets. Look for those that have stickers stating that they meet or exceed safety standards.  °· Vertical posts or pickets in deck, balcony, and stairway railings should be no more than 3 1/2 inches apart if a young baby will have access to the area. The space between horizontal rails or bars, and between the floor and the first horizontal rail or bar, should be no more than 3 1/2 inches.  °Document Released: 01/16/2002 Document Revised: 10/08/2010 Document Reviewed: 11/15/2008 °[employer]® Patient Information ©2012 [employer]. °

## 2010-12-19 NOTE — Assessment & Plan Note (Signed)
Check labs con't meds 

## 2010-12-19 NOTE — Assessment & Plan Note (Signed)
Refer to [facility] neuro at pt request

## 2010-12-19 NOTE — Assessment & Plan Note (Signed)
Per pulm Pt states she will work on being able to use cpap machine

## 2010-12-21 ENCOUNTER — Other Ambulatory Visit: Payer: Self-pay | Admitting: Family Medicine

## 2010-12-23 ENCOUNTER — Encounter: Payer: Self-pay | Admitting: Neurology

## 2010-12-23 ENCOUNTER — Other Ambulatory Visit: Payer: Self-pay

## 2010-12-23 DIAGNOSIS — F0390 Unspecified dementia without behavioral disturbance: Secondary | ICD-10-CM

## 2010-12-23 MED ORDER — RIVASTIGMINE 4.6 MG/24HR TD PT24: 1.0000 | MEDICATED_PATCH | Freq: Every day | TRANSDERMAL | Status: DC

## 2010-12-23 NOTE — Telephone Encounter (Signed)
Pt's daughter called to request that Rx for Exelon patch be sent to pharmacy. She states that pharmacy said they do not have it on file.  Chart listed Rx as having been printed on 12/19/10.  Rx resent to pharmacy

## 2010-12-26 ENCOUNTER — Other Ambulatory Visit: Payer: Self-pay | Admitting: Family Medicine

## 2010-12-26 MED ORDER — OLMESARTAN MEDOXOMIL 20 MG PO TABS: 20.0000 mg | ORAL_TABLET | Freq: Every day | ORAL | Status: DC

## 2010-12-26 NOTE — Telephone Encounter (Signed)
Faxed.   KP 

## 2011-01-05 ENCOUNTER — Other Ambulatory Visit: Payer: Self-pay | Admitting: Family Medicine

## 2011-01-05 NOTE — Telephone Encounter (Signed)
Last seen 12/19/10 and filled 11/22/10. please advise    KP

## 2011-01-06 MED ORDER — TRAMADOL HCL 50 MG PO TABS: 50.0000 mg | ORAL_TABLET | Freq: Four times a day (QID) | ORAL | Status: DC | PRN

## 2011-01-07 ENCOUNTER — Other Ambulatory Visit: Payer: Self-pay | Admitting: Family Medicine

## 2011-01-13 ENCOUNTER — Ambulatory Visit: Payer: Medicare Other | Admitting: Neurology

## 2011-01-16 ENCOUNTER — Other Ambulatory Visit: Payer: Self-pay | Admitting: Family Medicine

## 2011-01-16 MED ORDER — HYDROCHLOROTHIAZIDE 25 MG PO TABS: 25.0000 mg | ORAL_TABLET | Freq: Every day | ORAL | Status: DC

## 2011-01-16 NOTE — Telephone Encounter (Signed)
Faxed.   KP 

## 2011-01-19 ENCOUNTER — Other Ambulatory Visit: Payer: Self-pay | Admitting: Family Medicine

## 2011-01-21 ENCOUNTER — Other Ambulatory Visit: Payer: Self-pay | Admitting: Family Medicine

## 2011-01-22 ENCOUNTER — Telehealth: Payer: Self-pay | Admitting: Family Medicine

## 2011-01-22 NOTE — Telephone Encounter (Signed)
It has not been setup up because her daughter has not been able to get anymore days off yet, she will schedule at the first of the year. She has been given Dr.Wong's number to schedule. Any other suggestions. Please advise   KP

## 2011-01-22 NOTE — Telephone Encounter (Signed)
Yes it may be from patch--- when is neuro appointment?

## 2011-01-22 NOTE — Telephone Encounter (Signed)
Patient has not been able to slept in 3 days, feels like something is crawling on her ,restless and having headaches. Says her problems all started when she startedd using the Exelon patch. She is still on the lowest dose still has not seen Neurologist and Ames Coupe says she is going well on Exelon but has had these reactions and they are gradually getting worst. Last not was the last night she used the patch. Please advise    KP

## 2011-01-22 NOTE — Telephone Encounter (Signed)
No --- she really needs to see neuro-----can try namenda--starter pack

## 2011-01-23 MED ORDER — MEMANTINE HCL 28 X 5 MG & 21 X 10 MG PO TABS: ORAL_TABLET | ORAL | Status: DC

## 2011-01-23 NOTE — Telephone Encounter (Signed)
Addended by: Arnette Norris on: 01/23/2011 03:44 PM   Modules accepted: Orders

## 2011-01-23 NOTE — Telephone Encounter (Signed)
Discussed with Levada Schilling and she will pick up the Namenda on Monday and agreed to schedule and apt with Neuro.    KP

## 2011-02-06 ENCOUNTER — Other Ambulatory Visit: Payer: Self-pay | Admitting: Family Medicine

## 2011-02-10 HISTORY — PX: OTHER SURGICAL HISTORY: SHX169

## 2011-02-12 ENCOUNTER — Other Ambulatory Visit: Payer: Self-pay | Admitting: Family Medicine

## 2011-02-13 NOTE — Telephone Encounter (Signed)
Last seen 12/19/10 and filled 01/19/11.  Please advise     KP

## 2011-03-03 ENCOUNTER — Other Ambulatory Visit: Payer: Self-pay | Admitting: Family Medicine

## 2011-03-03 MED ORDER — TRAMADOL HCL 50 MG PO TABS: 50.0000 mg | ORAL_TABLET | Freq: Three times a day (TID) | ORAL | Status: DC | PRN

## 2011-03-03 NOTE — Telephone Encounter (Signed)
Last seen 12/19/10 and filled 02/11/10  #30. Please advise     KP

## 2011-03-05 DIAGNOSIS — M25569 Pain in unspecified knee: Secondary | ICD-10-CM | POA: Diagnosis not present

## 2011-03-05 DIAGNOSIS — Z79899 Other long term (current) drug therapy: Secondary | ICD-10-CM | POA: Diagnosis not present

## 2011-03-05 DIAGNOSIS — R5381 Other malaise: Secondary | ICD-10-CM | POA: Diagnosis not present

## 2011-03-05 DIAGNOSIS — M19049 Primary osteoarthritis, unspecified hand: Secondary | ICD-10-CM | POA: Diagnosis not present

## 2011-03-05 DIAGNOSIS — M545 Low back pain: Secondary | ICD-10-CM | POA: Diagnosis not present

## 2011-03-05 DIAGNOSIS — M109 Gout, unspecified: Secondary | ICD-10-CM | POA: Diagnosis not present

## 2011-03-05 DIAGNOSIS — M2550 Pain in unspecified joint: Secondary | ICD-10-CM | POA: Diagnosis not present

## 2011-03-13 ENCOUNTER — Other Ambulatory Visit: Payer: Self-pay | Admitting: Family Medicine

## 2011-03-13 DIAGNOSIS — H5200 Hypermetropia, unspecified eye: Secondary | ICD-10-CM | POA: Diagnosis not present

## 2011-03-13 DIAGNOSIS — F319 Bipolar disorder, unspecified: Secondary | ICD-10-CM

## 2011-03-13 DIAGNOSIS — H353190 Nonexudative age-related macular degeneration, unspecified eye, stage unspecified: Secondary | ICD-10-CM | POA: Diagnosis not present

## 2011-03-13 MED ORDER — ARIPIPRAZOLE 2 MG PO TABS: 2.0000 mg | ORAL_TABLET | Freq: Every day | ORAL | Status: DC

## 2011-03-13 NOTE — Telephone Encounter (Signed)
Last seen 12/19/10 .... Please advise   KP

## 2011-03-26 DIAGNOSIS — F331 Major depressive disorder, recurrent, moderate: Secondary | ICD-10-CM | POA: Diagnosis not present

## 2011-03-26 DIAGNOSIS — F0390 Unspecified dementia without behavioral disturbance: Secondary | ICD-10-CM | POA: Diagnosis not present

## 2011-03-30 ENCOUNTER — Telehealth: Payer: Self-pay | Admitting: Family Medicine

## 2011-03-30 ENCOUNTER — Other Ambulatory Visit: Payer: Self-pay | Admitting: Family Medicine

## 2011-03-30 MED ORDER — TRAMADOL HCL 50 MG PO TABS: 50.0000 mg | ORAL_TABLET | Freq: Three times a day (TID) | ORAL | Status: DC | PRN

## 2011-03-30 MED ORDER — ALENDRONATE SODIUM 70 MG PO TABS: 70.0000 mg | ORAL_TABLET | ORAL | Status: DC

## 2011-03-30 NOTE — Telephone Encounter (Signed)
Refill- tramadol hcl 50mg  tablet. Take one tablet (50mg  total) by mouth every 8 hours as needed for pain. Qty 3. Last fill 1.22.13

## 2011-03-30 NOTE — Telephone Encounter (Signed)
Rx faxed.    KP 

## 2011-04-07 ENCOUNTER — Other Ambulatory Visit: Payer: Self-pay | Admitting: Family Medicine

## 2011-04-16 ENCOUNTER — Ambulatory Visit (INDEPENDENT_AMBULATORY_CARE_PROVIDER_SITE_OTHER): Payer: Medicare Other | Admitting: Neurology

## 2011-04-16 ENCOUNTER — Encounter: Payer: Self-pay | Admitting: Neurology

## 2011-04-16 VITALS — BP 102/62 | HR 104 | Wt 223.0 lb

## 2011-04-16 DIAGNOSIS — R296 Repeated falls: Secondary | ICD-10-CM

## 2011-04-16 DIAGNOSIS — R269 Unspecified abnormalities of gait and mobility: Secondary | ICD-10-CM | POA: Diagnosis not present
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
## 2011-04-16 NOTE — Patient Instructions (Signed)
We will call with the appointment for the EEG it will be done at [facility].

## 2011-04-17 DIAGNOSIS — H18419 Arcus senilis, unspecified eye: Secondary | ICD-10-CM | POA: Diagnosis not present

## 2011-04-17 DIAGNOSIS — H2510 Age-related nuclear cataract, unspecified eye: Secondary | ICD-10-CM | POA: Diagnosis not present

## 2011-04-22 ENCOUNTER — Other Ambulatory Visit: Payer: Self-pay | Admitting: Family Medicine

## 2011-05-07 ENCOUNTER — Other Ambulatory Visit: Payer: Self-pay | Admitting: Family Medicine

## 2011-05-07 NOTE — Telephone Encounter (Signed)
Last seen 12/19/10 and filled 03/30/11 # 30. Please advise    KP

## 2011-05-12 ENCOUNTER — Ambulatory Visit (HOSPITAL_COMMUNITY)
Admission: RE | Admit: 2011-05-12 | Discharge: 2011-05-12 | Disposition: A | Discharge status: Home / Self Care | Payer: Medicare Other | Source: Ambulatory Visit | Attending: Neurology | Admitting: Neurology

## 2011-05-12 DIAGNOSIS — F29 Unspecified psychosis not due to a substance or known physiological condition: Secondary | ICD-10-CM | POA: Diagnosis not present

## 2011-05-12 DIAGNOSIS — R569 Unspecified convulsions: Secondary | ICD-10-CM | POA: Diagnosis not present

## 2011-05-12 DIAGNOSIS — R296 Repeated falls: Secondary | ICD-10-CM

## 2011-05-12 DIAGNOSIS — R4789 Other speech disturbances: Secondary | ICD-10-CM | POA: Diagnosis not present

## 2011-05-12 DIAGNOSIS — Z1389 Encounter for screening for other disorder: Secondary | ICD-10-CM | POA: Diagnosis not present

## 2011-05-12 DIAGNOSIS — R269 Unspecified abnormalities of gait and mobility: Secondary | ICD-10-CM | POA: Insufficient documentation

## 2011-05-12 NOTE — Progress Notes (Signed)
OP EEG, routine completed.

## 2011-05-13 NOTE — Procedures (Signed)
EEG NUMBER:  [id number].  This routine EEG was requested in this 71 year old woman with a history of falls, confusion, and chronic anxiety as well as depression.  She questioned whether she had viral encephalitis in 2009.  She has had progressive deterioration in her cognition, gait disturbance with slurred speech.  MEDICATIONS:  Clonazepam, Abilify, and Lexapro.  The EEG was done with the patient awake.  During periods of maximal wakefulness, there appeared to be a 7-8 cycle per second posterior dominant rhythm that attenuated with eye opening and was symmetric.  It was of low-to-medium amplitude.  It was moderately regulated and well sustained.  Background activities were composed of a mixture of low- frequency alpha and high-frequency theta activities.    Photic stimulation did not produce the driving response.  Hyperventilation was performed for 3 minutes with good effort.  It did not markedly change the tracing.  The EKG revealed a normal sinus rhythm, but with what appeared to be rare PVCs.  CLINICAL INTERPRETATION:  This routine EEG done with the patient in awake is abnormal.  Background activities are slightly too slow for her age and state suggesting a mild encephalopathy of nonspecific etiology.          ______________________________ Denton Meek, MD    JY:NWGN D:  05/13/2011 09:10:01  T:  05/13/2011 22:19:22  Job #:  [id number]

## 2011-05-14 DIAGNOSIS — H2510 Age-related nuclear cataract, unspecified eye: Secondary | ICD-10-CM | POA: Diagnosis not present

## 2011-05-14 DIAGNOSIS — H52229 Regular astigmatism, unspecified eye: Secondary | ICD-10-CM | POA: Diagnosis not present

## 2011-05-14 DIAGNOSIS — H00029 Hordeolum internum unspecified eye, unspecified eyelid: Secondary | ICD-10-CM | POA: Diagnosis not present

## 2011-05-14 DIAGNOSIS — H25019 Cortical age-related cataract, unspecified eye: Secondary | ICD-10-CM | POA: Diagnosis not present

## 2011-05-20 ENCOUNTER — Telehealth: Payer: Self-pay | Admitting: Family Medicine

## 2011-05-20 ENCOUNTER — Other Ambulatory Visit: Payer: Self-pay | Admitting: Family Medicine

## 2011-05-20 NOTE — Telephone Encounter (Signed)
Refill: Atorvastatin 10 mg tablet. Take 1 tablet by mouth every day.  Qty 30. Last fill 04-16-11

## 2011-05-21 MED ORDER — ATORVASTATIN CALCIUM 10 MG PO TABS: 10.0000 mg | ORAL_TABLET | Freq: Every day | ORAL | Status: DC

## 2011-05-26 DIAGNOSIS — H01009 Unspecified blepharitis unspecified eye, unspecified eyelid: Secondary | ICD-10-CM | POA: Diagnosis not present

## 2011-05-28 ENCOUNTER — Other Ambulatory Visit: Payer: Self-pay | Admitting: Family Medicine

## 2011-05-28 MED ORDER — AMLODIPINE BESYLATE 5 MG PO TABS: ORAL_TABLET | ORAL | Status: DC

## 2011-05-28 NOTE — Telephone Encounter (Signed)
Refill Amlodipine Besylate 5MG  Tab Qty 30, with 4-refills  Last filled 3.21.13  Last OV 11.09.12

## 2011-05-28 NOTE — Telephone Encounter (Signed)
Rx sent 

## 2011-06-01 ENCOUNTER — Telehealth: Payer: Self-pay | Admitting: Family Medicine

## 2011-06-01 DIAGNOSIS — E785 Hyperlipidemia, unspecified: Secondary | ICD-10-CM

## 2011-06-01 DIAGNOSIS — E119 Type 2 diabetes mellitus without complications: Secondary | ICD-10-CM

## 2011-06-01 NOTE — Telephone Encounter (Signed)
Coming in 5.3.13, per last lab letter for  Recheck 3 months---- 272.4 250.00 Lipid, hep, bmp, hgba1c  Can you please put in orders? Thanks

## 2011-06-03 ENCOUNTER — Other Ambulatory Visit: Payer: Self-pay | Admitting: Family Medicine

## 2011-06-12 ENCOUNTER — Other Ambulatory Visit: Payer: Medicare Other

## 2011-06-12 ENCOUNTER — Other Ambulatory Visit (INDEPENDENT_AMBULATORY_CARE_PROVIDER_SITE_OTHER): Payer: Medicare Other

## 2011-06-12 DIAGNOSIS — E119 Type 2 diabetes mellitus without complications: Secondary | ICD-10-CM

## 2011-06-12 DIAGNOSIS — E785 Hyperlipidemia, unspecified: Secondary | ICD-10-CM | POA: Diagnosis not present

## 2011-06-12 DIAGNOSIS — IMO0001 Reserved for inherently not codable concepts without codable children: Secondary | ICD-10-CM

## 2011-06-12 LAB — HEPATIC FUNCTION PANEL
AST: 22 U/L (ref 0–37)
Albumin: 4 g/dL (ref 3.5–5.2)
Alkaline Phosphatase: 71 U/L (ref 39–117)
Total Protein: 6.3 g/dL (ref 6.0–8.3)

## 2011-06-12 LAB — HEMOGLOBIN A1C: Hgb A1c MFr Bld: 6.4 % (ref 4.6–6.5)

## 2011-06-12 LAB — BASIC METABOLIC PANEL
Calcium: 9 mg/dL (ref 8.4–10.5)
Creatinine, Ser: 0.9 mg/dL (ref 0.4–1.2)
GFR: 64.38 mL/min (ref >60.00)

## 2011-06-12 LAB — LIPID PANEL
Cholesterol: 161 mg/dL (ref 0–200)
Triglycerides: 258 mg/dL — ABNORMAL HIGH (ref 0.0–149.0)

## 2011-06-12 LAB — LDL CHOLESTEROL, DIRECT: Direct LDL: 85.2 mg/dL

## 2011-06-15 ENCOUNTER — Other Ambulatory Visit: Payer: Self-pay | Admitting: Family Medicine

## 2011-06-15 NOTE — Telephone Encounter (Signed)
Last seen 12/19/10 and filled 05/07/11 # 30. Please advise    KP

## 2011-06-17 ENCOUNTER — Encounter: Payer: Self-pay | Admitting: Neurology

## 2011-06-17 ENCOUNTER — Other Ambulatory Visit: Payer: Medicare Other

## 2011-06-17 ENCOUNTER — Ambulatory Visit (INDEPENDENT_AMBULATORY_CARE_PROVIDER_SITE_OTHER): Payer: Medicare Other | Admitting: Neurology

## 2011-06-17 ENCOUNTER — Other Ambulatory Visit: Payer: Self-pay | Admitting: Neurology

## 2011-06-17 VITALS — BP 102/64 | HR 100 | Wt 224.0 lb

## 2011-06-17 DIAGNOSIS — G232 Striatonigral degeneration: Secondary | ICD-10-CM

## 2011-06-17 DIAGNOSIS — G238 Other specified degenerative diseases of basal ganglia: Secondary | ICD-10-CM

## 2011-06-17 DIAGNOSIS — G20C Parkinsonism, unspecified: Secondary | ICD-10-CM

## 2011-06-17 MED ORDER — CARBIDOPA-LEVODOPA 25-100 MG PO TABS: ORAL_TABLET | ORAL | Status: DC

## 2011-06-17 NOTE — Patient Instructions (Addendum)
Go to the basement to have your labs drawn today.  Your MRI is scheduled for Monday, May 13th at 5:00pm.   Please arrive to [facility] MRI by 4:45pm.  [phone].   Sinemet (levodopa/carbidopa) 100/25mg  tabs  take 1/2 tablet at breakfast for 5 days, then take 1/2 tablet at breakfast and dinner for 5 days, then take 1/2 tablet at breakfast, lunch and dinner for 5 days, then take 1 tablet at breakfast, 1/2 at lunch, 1/2 at dinner for 5 days then take 1 tablet at breakfast, 1/2 at lunch, 1 at dinner for 5 days,  then take 1 tablet at breakfast, lunch and dinner for 5 days.

## 2011-06-18 ENCOUNTER — Telehealth: Payer: Self-pay | Admitting: Neurology

## 2011-06-18 LAB — THYROGLOBULIN LEVEL: Thyroglobulin: 4.5 ng/mL (ref 0.0–55.0)

## 2011-06-18 NOTE — Telephone Encounter (Signed)
Pt's daughter would like to know how appointment went yesterday (06/18/2011). She can be reached before 12 pm at her office [phone], but you will have to ask for her. After 12 pm, she will have her cell phone [phone].
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
## 2011-06-22 ENCOUNTER — Ambulatory Visit (HOSPITAL_COMMUNITY)
Admission: RE | Admit: 2011-06-22 | Discharge: 2011-06-22 | Disposition: A | Discharge status: Home / Self Care | Payer: Medicare Other | Source: Ambulatory Visit | Attending: Neurology | Admitting: Neurology

## 2011-06-22 DIAGNOSIS — F29 Unspecified psychosis not due to a substance or known physiological condition: Secondary | ICD-10-CM | POA: Diagnosis not present

## 2011-06-22 DIAGNOSIS — F330 Major depressive disorder, recurrent, mild: Secondary | ICD-10-CM | POA: Diagnosis not present

## 2011-06-22 DIAGNOSIS — G319 Degenerative disease of nervous system, unspecified: Secondary | ICD-10-CM | POA: Diagnosis not present

## 2011-06-22 DIAGNOSIS — R4182 Altered mental status, unspecified: Secondary | ICD-10-CM | POA: Diagnosis not present

## 2011-06-22 DIAGNOSIS — Z9181 History of falling: Secondary | ICD-10-CM | POA: Diagnosis not present

## 2011-06-22 DIAGNOSIS — H01009 Unspecified blepharitis unspecified eye, unspecified eyelid: Secondary | ICD-10-CM | POA: Diagnosis not present

## 2011-06-22 DIAGNOSIS — G3184 Mild cognitive impairment, so stated: Secondary | ICD-10-CM | POA: Diagnosis not present

## 2011-06-22 DIAGNOSIS — R4789 Other speech disturbances: Secondary | ICD-10-CM | POA: Diagnosis not present

## 2011-06-22 DIAGNOSIS — G232 Striatonigral degeneration: Secondary | ICD-10-CM

## 2011-06-23 ENCOUNTER — Telehealth: Payer: Self-pay | Admitting: Neurology

## 2011-06-23 ENCOUNTER — Telehealth: Payer: Self-pay | Admitting: Family Medicine

## 2011-06-23 MED ORDER — METFORMIN HCL 500 MG PO TABS: 1000.0000 mg | ORAL_TABLET | Freq: Every day | ORAL | Status: DC

## 2011-06-23 MED ORDER — ATORVASTATIN CALCIUM 20 MG PO TABS: 10.0000 mg | ORAL_TABLET | Freq: Every day | ORAL | Status: DC

## 2011-06-23 NOTE — Telephone Encounter (Signed)
Message copied by Benay Spice on Tue Jun 23, 2011  9:19 AM ------      Message from: Milas Gain      Created: Thu Jun 18, 2011 10:46 PM       also, if you could check to see if Dr. Leonides Cave did NP testing on [place] that would be great -- and get the report of course.            Matt

## 2011-06-23 NOTE — Telephone Encounter (Signed)
Refill: Lipitor 10mg  Metformin 500mg   [facility] [phone]

## 2011-06-23 NOTE — Telephone Encounter (Signed)
Called and spoke with Angie at [facility]. She will ask Dr. Leonides Cave re: neuro psych testing for this patient and let us know.

## 2011-06-23 NOTE — Telephone Encounter (Signed)
Notes received and given to Dr. Modesto Charon.

## 2011-06-26 NOTE — Telephone Encounter (Signed)
had a long discussion with daughter Zenaida Niece.  It sounds like patient started Ablify around 03/2010.  Falls may have started sometime before then, although it could be around the same time.  MRI brain that I repeated did not show any major change except for som ex vacuo dilatation that is slightly worse.  NP testing results from 2011 did not show an signs of primary neurodegenerative disease.  Hashimoto's abs were negative, paraneoplastic panel is pending.  She does seem to be doing better on the Sinemet - although I am worried I am treating drug induced Parkinson's disease.    I am going to talk to Orion Modest who I think is an psychiatric NP about stopping the abilify.  I am waiting for the notes to come from her office which the daughter is going to arrange.  daughter will call back in 1 week to follow up.

## 2011-07-01 MED ORDER — ATORVASTATIN CALCIUM 20 MG PO TABS: 20.0000 mg | ORAL_TABLET | Freq: Every day | ORAL | Status: DC

## 2011-07-09 ENCOUNTER — Other Ambulatory Visit: Payer: Self-pay | Admitting: Family Medicine

## 2011-07-14 ENCOUNTER — Telehealth: Payer: Self-pay | Admitting: Neurology

## 2011-07-14 NOTE — Telephone Encounter (Signed)
Picked up a call from the patient's daughter. She was wondering if Dr. Modesto Charon had ever received the notes from the patient's psy md. I told her that Dr. Modesto Charon was out of the office until Monday and that I did not have access to his office. She was going to call the psy doctor's office again and have them fax the notes. I told her I would be looking for them.

## 2011-07-28 ENCOUNTER — Telehealth: Payer: Self-pay | Admitting: Family Medicine

## 2011-07-28 DIAGNOSIS — H00029 Hordeolum internum unspecified eye, unspecified eyelid: Secondary | ICD-10-CM | POA: Diagnosis not present

## 2011-07-28 DIAGNOSIS — H2510 Age-related nuclear cataract, unspecified eye: Secondary | ICD-10-CM | POA: Diagnosis not present

## 2011-07-28 MED ORDER — CELECOXIB 200 MG PO CAPS: 200.0000 mg | ORAL_CAPSULE | Freq: Every day | ORAL | Status: DC

## 2011-07-28 NOTE — Telephone Encounter (Signed)
Refill: Celebrex 200mg  capsule. Take 1 capsule every day. Qty 30. Last fill 06-15-11

## 2011-07-28 NOTE — Addendum Note (Signed)
Addended by: Arnette Norris on: 07/28/2011 03:34 PM   Modules accepted: Orders

## 2011-07-28 NOTE — Telephone Encounter (Addendum)
RX to [facility] [address] cancelled with Jonny Ruiz and sent to [facility] [address]    KP

## 2011-07-29 ENCOUNTER — Other Ambulatory Visit: Payer: Self-pay | Admitting: Family Medicine

## 2011-07-29 MED ORDER — AMLODIPINE BESYLATE 5 MG PO TABS: ORAL_TABLET | ORAL | Status: DC

## 2011-07-29 NOTE — Telephone Encounter (Signed)
refill Amlodipine Besylate 5 MG Tab #30 Last fill 5.14.13 Take one tablet every day Last ov 5.8.13

## 2011-08-04 ENCOUNTER — Other Ambulatory Visit: Payer: Self-pay | Admitting: Family Medicine

## 2011-08-04 MED ORDER — TRAMADOL HCL 50 MG PO TABS: 50.0000 mg | ORAL_TABLET | Freq: Three times a day (TID) | ORAL | Status: DC | PRN

## 2011-08-04 NOTE — Telephone Encounter (Signed)
Refill Tramadol HCL 50 MG #30, take one tablet by mouth every 8-hours as needed for pain, last fil 5.6.13, last ov 5.8.13

## 2011-08-04 NOTE — Telephone Encounter (Signed)
Last seen 12/19/10 and filled 06/15/11 # 30. Please advise     KP

## 2011-08-04 NOTE — Telephone Encounter (Signed)
Refill x1   1 refill 

## 2011-08-18 ENCOUNTER — Ambulatory Visit (INDEPENDENT_AMBULATORY_CARE_PROVIDER_SITE_OTHER): Payer: Medicare Other | Admitting: Family Medicine

## 2011-08-18 ENCOUNTER — Ambulatory Visit (INDEPENDENT_AMBULATORY_CARE_PROVIDER_SITE_OTHER)
Admission: RE | Admit: 2011-08-18 | Discharge: 2011-08-18 | Disposition: A | Discharge status: Home / Self Care | Payer: Medicare Other | Source: Ambulatory Visit | Attending: Family Medicine | Admitting: Family Medicine

## 2011-08-18 ENCOUNTER — Encounter: Payer: Self-pay | Admitting: Family Medicine

## 2011-08-18 VITALS — BP 116/80 | HR 100 | Temp 98.3°F | Wt 220.4 lb

## 2011-08-18 DIAGNOSIS — M25559 Pain in unspecified hip: Secondary | ICD-10-CM | POA: Diagnosis not present

## 2011-08-18 DIAGNOSIS — M5126 Other intervertebral disc displacement, lumbar region: Secondary | ICD-10-CM | POA: Diagnosis not present

## 2011-08-18 DIAGNOSIS — M5137 Other intervertebral disc degeneration, lumbosacral region: Secondary | ICD-10-CM | POA: Diagnosis not present

## 2011-08-18 DIAGNOSIS — M47817 Spondylosis without myelopathy or radiculopathy, lumbosacral region: Secondary | ICD-10-CM | POA: Diagnosis not present

## 2011-08-18 DIAGNOSIS — E669 Obesity, unspecified: Secondary | ICD-10-CM | POA: Diagnosis not present

## 2011-08-18 DIAGNOSIS — M479 Spondylosis, unspecified: Secondary | ICD-10-CM | POA: Diagnosis not present

## 2011-08-18 MED ORDER — TRAMADOL HCL 50 MG PO TABS: 50.0000 mg | ORAL_TABLET | Freq: Three times a day (TID) | ORAL | Status: DC | PRN

## 2011-08-18 NOTE — Patient Instructions (Addendum)
Hip Pain  The hips join the upper legs to the lower pelvis. The bones, cartilage, tendons, and muscles of the hip joint perform a lot of work each day holding your body weight and allowing you to move around.  Hip pain is a common symptom. It can range from a minor ache to severe pain on 1 or both hips. Pain may be felt on the inside of the hip joint near the groin, or the outside near the buttocks and upper thigh. There may be swelling or stiffness as well. It occurs more often when a person walks or performs activity. There are many reasons hip pain can develop.  CAUSES   It is important to work with your caregiver to identify the cause since many conditions can impact the bones, cartilage, muscles, and tendons of the hips. Causes for hip pain include:   Broken (fractured) bones.   Separation of the thighbone from the hip socket (dislocation).   Torn cartilage of the hip joint.   Swelling (inflammation) of a tendon (tendonitis), the sac within the hip joint (bursitis), or a joint.   A weakening in the abdominal wall (hernia), affecting the nerves to the hip.   Arthritis in the hip joint or lining of the hip joint.   Pinched nerves in the back, hip, or upper thigh.   A bulging disc in the spine (herniated disc).   Rarely, bone infection or cancer.  DIAGNOSIS   The location of your hip pain will help your caregiver understand what may be causing the pain. A diagnosis is based on your medical history, your symptoms, results from your physical exam, and results from diagnostic tests. Diagnostic tests may include X-ray exams, a computerized magnetic scan (magnetic resonance imaging, MRI), or bone scan.  TREATMENT   Treatment will depend on the cause of your hip pain. Treatment may include:   Limiting activities and resting until symptoms improve.   Crutches or other walking supports (a cane or brace).   Ice, elevation, and compression.   Physical therapy or home exercises.   Shoe inserts or special  shoes.   Losing weight.   Medications to reduce pain.   Undergoing surgery.  HOME CARE INSTRUCTIONS    Only take over-the-counter or prescription medicines for pain, discomfort, or fever as directed by your caregiver.   Put ice on the injured area:   Put ice in a plastic bag.   Place a towel between your skin and the bag.   Leave the ice on for 15 to 20 minutes at a time, 3 to 4 times a day.   Keep your leg raised (elevated) when possible to lessen swelling.   Avoid activities that cause pain.   Follow specific exercises as directed by your caregiver.   Sleep with a pillow between your legs on your most comfortable side.   Record how often you have hip pain, the location of the pain, and what it feels like. This information may be helpful to you and your caregiver.   Ask your caregiver about returning to work or sports and whether you should drive.   Follow up with your caregiver for further exams, therapy, or testing as directed.  SEEK MEDICAL CARE IF:    Your pain or swelling continues or worsens after 1 week.   You are feeling unwell or have chills.   You have increasing difficulty with walking.   You have a loss of sensation or other new symptoms.   You have questions   or concerns.  SEEK IMMEDIATE MEDICAL CARE IF:    You cannot put weight on the affected hip.   You have fallen.   You have a sudden increase in pain and swelling in your hip.   You have a fever.  MAKE SURE YOU:    Understand these instructions.   Will watch your condition.   Will get help right away if you are not doing well or get worse.  Document Released: 07/16/2009 Document Revised: 01/15/2011 Document Reviewed: 07/16/2009  [employer] Patient Information 2012 [employer].

## 2011-08-18 NOTE — Progress Notes (Signed)
  Subjective:    [name] is a 71 y.o. female who presents with right hip pain. Onset of the symptoms was 5 days ago. Inciting event: none. The patient reports the hip pain is aggravated by walking and radiates to R knee. Aggravating symptoms include: any weight bearing, rising after sitting, standing and walking. Patient has had no prior hip problems. Previous visits for this problem: none. Evaluation to date: none. Treatment to date: OTC analgesics, which have been somewhat effective and heat.  The following portions of the patient's history were reviewed and updated as appropriate: allergies, current medications, past family history, past medical history, past social history, past surgical history and problem list.   Review of Systems Pertinent items are noted in HPI.   Objective:    BP 116/80  Pulse 100  Temp 98.3 F (36.8 C) (Oral)  Wt 220 lb 6.4 oz (99.973 kg)  SpO2 96% Right hip: full painless range of motion, without tenderness and pain only with weight bearing  Left hip: normal   Imaging: X-ray the right hip: not available    Assessment:    R hip pain    Plan:    Educational materials distributed. X-rays per orders. Follow up in 2 weeks. ultram for pain  Further recommendations after xray

## 2011-08-20 ENCOUNTER — Encounter: Payer: Self-pay | Admitting: Neurology

## 2011-08-20 ENCOUNTER — Ambulatory Visit (INDEPENDENT_AMBULATORY_CARE_PROVIDER_SITE_OTHER): Payer: Medicare Other | Admitting: Neurology

## 2011-08-20 VITALS — BP 118/70 | HR 88 | Wt 225.0 lb

## 2011-08-20 DIAGNOSIS — G20A1 Parkinson's disease without dyskinesia, without mention of fluctuations: Secondary | ICD-10-CM

## 2011-08-20 DIAGNOSIS — G20C Parkinsonism, unspecified: Secondary | ICD-10-CM

## 2011-08-20 DIAGNOSIS — G20 Parkinson's disease: Secondary | ICD-10-CM
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
## 2011-08-21 ENCOUNTER — Telehealth: Payer: Self-pay | Admitting: Family Medicine

## 2011-08-21 NOTE — Telephone Encounter (Signed)
[name] made aware and will call when refills are needed.     KP

## 2011-08-21 NOTE — Telephone Encounter (Signed)
Spoke with Zenaida Niece and she stated that she will have her mother stop the Abilify, they do not want to see psych again because she has a hard time getting her there. She would like to know if you would manage the Klonopin and Lexapro. Please advise      KP

## 2011-08-21 NOTE — Telephone Encounter (Signed)
That is fine 

## 2011-08-21 NOTE — Telephone Encounter (Signed)
If she is only taking 2 mg she can just stop it-- do they want names of different psych?

## 2011-08-21 NOTE — Telephone Encounter (Signed)
Caller: Jalynne/Child; Phone Number: [phone]; Message from caller: Daughter states that Neurologist has recommended her mom get off of Ablify because it maybe making Parkinson s/s worse.  The Neurologist however is not comfortable taking her off of medication.  They are not happy with psychologist that she is seeing.  Daughter wants to know if Dr.  Laury Axon will help to get pt off of Abilify, she thinks Dr.  Laury Axon originally prescribed medication.

## 2011-08-24 DIAGNOSIS — M545 Low back pain: Secondary | ICD-10-CM | POA: Diagnosis not present

## 2011-08-24 DIAGNOSIS — I10 Essential (primary) hypertension: Secondary | ICD-10-CM | POA: Diagnosis not present

## 2011-08-24 DIAGNOSIS — F319 Bipolar disorder, unspecified: Secondary | ICD-10-CM | POA: Diagnosis not present

## 2011-08-24 DIAGNOSIS — E119 Type 2 diabetes mellitus without complications: Secondary | ICD-10-CM | POA: Diagnosis not present

## 2011-08-24 DIAGNOSIS — M25559 Pain in unspecified hip: Secondary | ICD-10-CM | POA: Diagnosis not present

## 2011-08-24 DIAGNOSIS — F429 Obsessive-compulsive disorder, unspecified: Secondary | ICD-10-CM | POA: Diagnosis not present

## 2011-08-26 ENCOUNTER — Telehealth: Payer: Self-pay | Admitting: Neurology

## 2011-08-26 NOTE — Telephone Encounter (Signed)
Pt's daughter returned your call. She says they have had no luck with psych, but that pt's pcp has given permission for pt to be off Abilify. She stopped taking med on Sunday. You can call daughter Ane Payment back at new work # [phone] or cell phone [phone].

## 2011-08-26 NOTE — Telephone Encounter (Signed)
Message copied by Benay Spice on Wed Aug 26, 2011  9:54 AM ------      Message from: Milas Gain      Created: Tue Aug 25, 2011  4:58 PM       Hi Jan,            Could you call Ms. Lhommedieu's daughter - should be in the system - and just run by if she is ok stopping Ms. Rys's Abilify.  I know she is in between psychiatrists but I think it may be causing some of her walking problems.  If she is ok with it, just tell her to stop the Abilify.  She should watch for worsening depressive symptoms.            Thx.             matt

## 2011-08-26 NOTE — Telephone Encounter (Signed)
Spoke with the patient's daughter, Zenaida Niece. She states the Abilify was stopped on Sunday. Information was given as per Dr. Modesto Charon re: watch for worsening depression. Zenaida Niece states she will.

## 2011-08-26 NOTE — Telephone Encounter (Signed)
Left a message for the patient's daughter, Zenaida Niece to return my call.

## 2011-08-26 NOTE — Telephone Encounter (Signed)
Spoke with the patient's daughter, Jalynne. She states the Abilify was stopped on Sunday. Information was given as per Dr. Wong re: watch for worsening depression. Jalynne states she will.  

## 2011-08-27 DIAGNOSIS — M25559 Pain in unspecified hip: Secondary | ICD-10-CM | POA: Diagnosis not present

## 2011-08-27 DIAGNOSIS — F429 Obsessive-compulsive disorder, unspecified: Secondary | ICD-10-CM | POA: Diagnosis not present

## 2011-08-27 DIAGNOSIS — M545 Low back pain: Secondary | ICD-10-CM | POA: Diagnosis not present

## 2011-08-27 DIAGNOSIS — E119 Type 2 diabetes mellitus without complications: Secondary | ICD-10-CM | POA: Diagnosis not present

## 2011-08-27 DIAGNOSIS — I10 Essential (primary) hypertension: Secondary | ICD-10-CM | POA: Diagnosis not present

## 2011-08-27 DIAGNOSIS — F319 Bipolar disorder, unspecified: Secondary | ICD-10-CM | POA: Diagnosis not present

## 2011-08-31 DIAGNOSIS — H269 Unspecified cataract: Secondary | ICD-10-CM | POA: Diagnosis not present

## 2011-08-31 DIAGNOSIS — H2510 Age-related nuclear cataract, unspecified eye: Secondary | ICD-10-CM | POA: Diagnosis not present

## 2011-09-01 ENCOUNTER — Other Ambulatory Visit: Payer: Self-pay | Admitting: Family Medicine

## 2011-09-01 DIAGNOSIS — H2510 Age-related nuclear cataract, unspecified eye: Secondary | ICD-10-CM | POA: Diagnosis not present

## 2011-09-01 NOTE — Telephone Encounter (Signed)
Rx was sent to [facility] 08/04/11 # 30 with 1 refill...   KP

## 2011-09-02 ENCOUNTER — Telehealth: Payer: Self-pay | Admitting: Family Medicine

## 2011-09-02 DIAGNOSIS — F319 Bipolar disorder, unspecified: Secondary | ICD-10-CM | POA: Diagnosis not present

## 2011-09-02 DIAGNOSIS — E119 Type 2 diabetes mellitus without complications: Secondary | ICD-10-CM | POA: Diagnosis not present

## 2011-09-02 DIAGNOSIS — F429 Obsessive-compulsive disorder, unspecified: Secondary | ICD-10-CM | POA: Diagnosis not present

## 2011-09-02 DIAGNOSIS — M25559 Pain in unspecified hip: Secondary | ICD-10-CM | POA: Diagnosis not present

## 2011-09-02 DIAGNOSIS — M545 Low back pain: Secondary | ICD-10-CM | POA: Diagnosis not present

## 2011-09-02 DIAGNOSIS — I10 Essential (primary) hypertension: Secondary | ICD-10-CM | POA: Diagnosis not present

## 2011-09-02 MED ORDER — HYDROCHLOROTHIAZIDE 25 MG PO TABS: 25.0000 mg | ORAL_TABLET | Freq: Every day | ORAL | Status: DC

## 2011-09-02 MED ORDER — AMLODIPINE BESYLATE 5 MG PO TABS: ORAL_TABLET | ORAL | Status: DC

## 2011-09-02 NOTE — Telephone Encounter (Signed)
Refill: Hydrochlorothiazide 25mg  tab. Take 1 tablet by mouth daily. Qty 100. Last fill 07-27-11 Amlodipine besylate 5mg  tab. Take 1 tablet every day. Qty 30. Last fill 07-29-11

## 2011-09-03 DIAGNOSIS — M545 Low back pain: Secondary | ICD-10-CM | POA: Diagnosis not present

## 2011-09-03 DIAGNOSIS — E119 Type 2 diabetes mellitus without complications: Secondary | ICD-10-CM | POA: Diagnosis not present

## 2011-09-03 DIAGNOSIS — F319 Bipolar disorder, unspecified: Secondary | ICD-10-CM | POA: Diagnosis not present

## 2011-09-03 DIAGNOSIS — I10 Essential (primary) hypertension: Secondary | ICD-10-CM | POA: Diagnosis not present

## 2011-09-03 DIAGNOSIS — M25559 Pain in unspecified hip: Secondary | ICD-10-CM | POA: Diagnosis not present

## 2011-09-03 DIAGNOSIS — F429 Obsessive-compulsive disorder, unspecified: Secondary | ICD-10-CM | POA: Diagnosis not present

## 2011-09-08 DIAGNOSIS — M25559 Pain in unspecified hip: Secondary | ICD-10-CM | POA: Diagnosis not present

## 2011-09-08 DIAGNOSIS — M545 Low back pain: Secondary | ICD-10-CM | POA: Diagnosis not present

## 2011-09-08 DIAGNOSIS — I10 Essential (primary) hypertension: Secondary | ICD-10-CM | POA: Diagnosis not present

## 2011-09-08 DIAGNOSIS — F429 Obsessive-compulsive disorder, unspecified: Secondary | ICD-10-CM | POA: Diagnosis not present

## 2011-09-08 DIAGNOSIS — F319 Bipolar disorder, unspecified: Secondary | ICD-10-CM | POA: Diagnosis not present

## 2011-09-08 DIAGNOSIS — E119 Type 2 diabetes mellitus without complications: Secondary | ICD-10-CM | POA: Diagnosis not present

## 2011-09-14 ENCOUNTER — Telehealth: Payer: Self-pay | Admitting: Family Medicine

## 2011-09-14 DIAGNOSIS — M25559 Pain in unspecified hip: Secondary | ICD-10-CM | POA: Diagnosis not present

## 2011-09-14 DIAGNOSIS — F429 Obsessive-compulsive disorder, unspecified: Secondary | ICD-10-CM | POA: Diagnosis not present

## 2011-09-14 DIAGNOSIS — E119 Type 2 diabetes mellitus without complications: Secondary | ICD-10-CM | POA: Diagnosis not present

## 2011-09-14 DIAGNOSIS — F319 Bipolar disorder, unspecified: Secondary | ICD-10-CM

## 2011-09-14 DIAGNOSIS — M545 Low back pain: Secondary | ICD-10-CM | POA: Diagnosis not present

## 2011-09-14 DIAGNOSIS — I10 Essential (primary) hypertension: Secondary | ICD-10-CM | POA: Diagnosis not present

## 2011-09-14 MED ORDER — ARIPIPRAZOLE 2 MG PO TABS: 2.0000 mg | ORAL_TABLET | Freq: Every day | ORAL | Status: DC

## 2011-09-14 NOTE — Telephone Encounter (Signed)
Refill: Abilify 2mg  tablet. Take 1 tablet by mouth at bedtime. Qty 30. Last fill 08-10-11

## 2011-09-16 DIAGNOSIS — F319 Bipolar disorder, unspecified: Secondary | ICD-10-CM | POA: Diagnosis not present

## 2011-09-16 DIAGNOSIS — F429 Obsessive-compulsive disorder, unspecified: Secondary | ICD-10-CM | POA: Diagnosis not present

## 2011-09-16 DIAGNOSIS — E119 Type 2 diabetes mellitus without complications: Secondary | ICD-10-CM | POA: Diagnosis not present

## 2011-09-16 DIAGNOSIS — M545 Low back pain: Secondary | ICD-10-CM | POA: Diagnosis not present

## 2011-09-16 DIAGNOSIS — M25559 Pain in unspecified hip: Secondary | ICD-10-CM | POA: Diagnosis not present

## 2011-09-16 DIAGNOSIS — I10 Essential (primary) hypertension: Secondary | ICD-10-CM | POA: Diagnosis not present

## 2011-09-21 ENCOUNTER — Other Ambulatory Visit: Payer: Self-pay | Admitting: Family Medicine

## 2011-09-21 DIAGNOSIS — F319 Bipolar disorder, unspecified: Secondary | ICD-10-CM | POA: Diagnosis not present

## 2011-09-21 DIAGNOSIS — M25559 Pain in unspecified hip: Secondary | ICD-10-CM | POA: Diagnosis not present

## 2011-09-21 DIAGNOSIS — M545 Low back pain: Secondary | ICD-10-CM | POA: Diagnosis not present

## 2011-09-21 DIAGNOSIS — E119 Type 2 diabetes mellitus without complications: Secondary | ICD-10-CM | POA: Diagnosis not present

## 2011-09-21 DIAGNOSIS — I10 Essential (primary) hypertension: Secondary | ICD-10-CM | POA: Diagnosis not present

## 2011-09-21 DIAGNOSIS — F429 Obsessive-compulsive disorder, unspecified: Secondary | ICD-10-CM | POA: Diagnosis not present

## 2011-09-24 ENCOUNTER — Encounter: Payer: Self-pay | Admitting: Neurology

## 2011-09-24 ENCOUNTER — Ambulatory Visit (INDEPENDENT_AMBULATORY_CARE_PROVIDER_SITE_OTHER): Payer: Medicare Other | Admitting: Neurology

## 2011-09-24 VITALS — BP 114/72 | HR 108 | Wt 225.0 lb

## 2011-09-24 DIAGNOSIS — E119 Type 2 diabetes mellitus without complications: Secondary | ICD-10-CM | POA: Diagnosis not present

## 2011-09-24 DIAGNOSIS — R413 Other amnesia: Secondary | ICD-10-CM | POA: Diagnosis not present

## 2011-09-24 DIAGNOSIS — M545 Low back pain: Secondary | ICD-10-CM | POA: Diagnosis not present

## 2011-09-24 DIAGNOSIS — F429 Obsessive-compulsive disorder, unspecified: Secondary | ICD-10-CM | POA: Diagnosis not present

## 2011-09-24 DIAGNOSIS — M25559 Pain in unspecified hip: Secondary | ICD-10-CM | POA: Diagnosis not present

## 2011-09-24 DIAGNOSIS — I10 Essential (primary) hypertension: Secondary | ICD-10-CM | POA: Diagnosis not present

## 2011-09-24 DIAGNOSIS — F319 Bipolar disorder, unspecified: Secondary | ICD-10-CM | POA: Diagnosis not present
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
## 2011-09-25 ENCOUNTER — Telehealth: Payer: Self-pay | Admitting: Neurology

## 2011-09-25 NOTE — Telephone Encounter (Signed)
Pt's daughter called regarding mother's appointment yesterday, 09/24/2011. She said her mother had a hard time explaining exactly what went on in the appointment and that she mentioned the possibility of a new drug, so daughter Precious Haws) would like to get a more clear understanding of situation. You can call her at her cell phone or her work number.

## 2011-09-29 DIAGNOSIS — F319 Bipolar disorder, unspecified: Secondary | ICD-10-CM | POA: Diagnosis not present

## 2011-09-29 DIAGNOSIS — M25559 Pain in unspecified hip: Secondary | ICD-10-CM | POA: Diagnosis not present

## 2011-09-29 DIAGNOSIS — M545 Low back pain: Secondary | ICD-10-CM | POA: Diagnosis not present

## 2011-09-29 DIAGNOSIS — F429 Obsessive-compulsive disorder, unspecified: Secondary | ICD-10-CM | POA: Diagnosis not present

## 2011-09-29 DIAGNOSIS — E119 Type 2 diabetes mellitus without complications: Secondary | ICD-10-CM | POA: Diagnosis not present

## 2011-09-29 DIAGNOSIS — I10 Essential (primary) hypertension: Secondary | ICD-10-CM | POA: Diagnosis not present

## 2011-10-01 DIAGNOSIS — M545 Low back pain: Secondary | ICD-10-CM | POA: Diagnosis not present

## 2011-10-01 DIAGNOSIS — M25559 Pain in unspecified hip: Secondary | ICD-10-CM | POA: Diagnosis not present

## 2011-10-01 DIAGNOSIS — F319 Bipolar disorder, unspecified: Secondary | ICD-10-CM | POA: Diagnosis not present

## 2011-10-01 DIAGNOSIS — F429 Obsessive-compulsive disorder, unspecified: Secondary | ICD-10-CM | POA: Diagnosis not present

## 2011-10-01 DIAGNOSIS — E119 Type 2 diabetes mellitus without complications: Secondary | ICD-10-CM | POA: Diagnosis not present

## 2011-10-01 DIAGNOSIS — I10 Essential (primary) hypertension: Secondary | ICD-10-CM | POA: Diagnosis not present

## 2011-10-05 DIAGNOSIS — F319 Bipolar disorder, unspecified: Secondary | ICD-10-CM | POA: Diagnosis not present

## 2011-10-05 DIAGNOSIS — E119 Type 2 diabetes mellitus without complications: Secondary | ICD-10-CM

## 2011-10-05 DIAGNOSIS — M545 Low back pain: Secondary | ICD-10-CM | POA: Diagnosis not present

## 2011-10-05 DIAGNOSIS — M25559 Pain in unspecified hip: Secondary | ICD-10-CM

## 2011-10-07 ENCOUNTER — Other Ambulatory Visit: Payer: Self-pay | Admitting: Neurology

## 2011-10-08 ENCOUNTER — Other Ambulatory Visit: Payer: Self-pay | Admitting: Neurology

## 2011-10-16 DIAGNOSIS — H2510 Age-related nuclear cataract, unspecified eye: Secondary | ICD-10-CM | POA: Diagnosis not present

## 2011-10-16 DIAGNOSIS — H269 Unspecified cataract: Secondary | ICD-10-CM | POA: Diagnosis not present

## 2011-10-22 DIAGNOSIS — Z23 Encounter for immunization: Secondary | ICD-10-CM | POA: Diagnosis not present

## 2011-10-23 ENCOUNTER — Other Ambulatory Visit: Payer: Self-pay | Admitting: Family Medicine

## 2011-11-07 ENCOUNTER — Other Ambulatory Visit: Payer: Self-pay | Admitting: Family Medicine

## 2011-11-10 ENCOUNTER — Other Ambulatory Visit: Payer: Self-pay | Admitting: Family Medicine

## 2011-11-10 DIAGNOSIS — Z1231 Encounter for screening mammogram for malignant neoplasm of breast: Secondary | ICD-10-CM

## 2011-11-13 ENCOUNTER — Ambulatory Visit (INDEPENDENT_AMBULATORY_CARE_PROVIDER_SITE_OTHER): Payer: Medicare Other | Admitting: Family Medicine

## 2011-11-13 ENCOUNTER — Encounter: Payer: Self-pay | Admitting: Family Medicine

## 2011-11-13 VITALS — BP 102/60 | HR 114 | Temp 98.8°F | Wt 211.0 lb

## 2011-11-13 DIAGNOSIS — F419 Anxiety disorder, unspecified: Secondary | ICD-10-CM

## 2011-11-13 DIAGNOSIS — M159 Polyosteoarthritis, unspecified: Secondary | ICD-10-CM

## 2011-11-13 DIAGNOSIS — E119 Type 2 diabetes mellitus without complications: Secondary | ICD-10-CM

## 2011-11-13 DIAGNOSIS — G20 Parkinson's disease: Secondary | ICD-10-CM | POA: Diagnosis not present

## 2011-11-13 DIAGNOSIS — E785 Hyperlipidemia, unspecified: Secondary | ICD-10-CM

## 2011-11-13 DIAGNOSIS — F411 Generalized anxiety disorder: Secondary | ICD-10-CM

## 2011-11-13 DIAGNOSIS — B370 Candidal stomatitis: Secondary | ICD-10-CM | POA: Diagnosis not present

## 2011-11-13 DIAGNOSIS — R279 Unspecified lack of coordination: Secondary | ICD-10-CM

## 2011-11-13 DIAGNOSIS — W19XXXA Unspecified fall, initial encounter: Secondary | ICD-10-CM

## 2011-11-13 DIAGNOSIS — I10 Essential (primary) hypertension: Secondary | ICD-10-CM

## 2011-11-13 DIAGNOSIS — R296 Repeated falls: Secondary | ICD-10-CM

## 2011-11-13 DIAGNOSIS — E039 Hypothyroidism, unspecified: Secondary | ICD-10-CM

## 2011-11-13 MED ORDER — FIRST-DUKES MOUTHWASH MT SUSP: OROMUCOSAL | Status: DC

## 2011-11-13 MED ORDER — CLONAZEPAM 0.5 MG PO TABS: 0.5000 mg | ORAL_TABLET | Freq: Two times a day (BID) | ORAL | Status: DC

## 2011-11-13 NOTE — Patient Instructions (Addendum)

## 2011-11-14 ENCOUNTER — Other Ambulatory Visit: Payer: Self-pay | Admitting: Family Medicine

## 2011-11-15 ENCOUNTER — Encounter: Payer: Self-pay | Admitting: Family Medicine

## 2011-11-15 NOTE — Assessment & Plan Note (Signed)
stable °

## 2011-11-15 NOTE — Progress Notes (Signed)
  Subjective:    Patient ID: [name], female    DOB: 1940-10-30, 71 y.o.   MRN: [id number]  HPI Pt here to have face to face paperwork filled out.  Home health was coming to the home for pt but that has finished.  Pt is doing very well.   No complaints. Pt is not driving and family had a driver take her in today.   Review of Systems As above    Objective:   Physical Exam  Constitutional: She is oriented to person, place, and time. She appears well-developed and well-nourished.  Neck: Normal range of motion. Neck supple.  Cardiovascular: Normal rate and regular rhythm.   Pulmonary/Chest: Effort normal and breath sounds normal.  Neurological: She is alert and oriented to person, place, and time.  Psychiatric: She has a normal mood and affect. Her behavior is normal. Judgment and thought content normal.          Assessment & Plan:

## 2011-11-15 NOTE — Assessment & Plan Note (Signed)
Pt completed Paperwork completed

## 2011-11-15 NOTE — Assessment & Plan Note (Signed)
Check labs con't meds 

## 2011-11-15 NOTE — Assessment & Plan Note (Signed)
con't meds stable 

## 2011-11-18 ENCOUNTER — Other Ambulatory Visit (INDEPENDENT_AMBULATORY_CARE_PROVIDER_SITE_OTHER): Payer: Medicare Other

## 2011-11-18 DIAGNOSIS — E039 Hypothyroidism, unspecified: Secondary | ICD-10-CM

## 2011-11-18 DIAGNOSIS — E119 Type 2 diabetes mellitus without complications: Secondary | ICD-10-CM

## 2011-11-18 LAB — BASIC METABOLIC PANEL
CO2: 28 mEq/L (ref 19–32)
Calcium: 9.3 mg/dL (ref 8.4–10.5)
GFR: 65.12 mL/min (ref >60.00)
Glucose, Bld: 123 mg/dL — ABNORMAL HIGH (ref 70–99)
Potassium: 4.1 mEq/L (ref 3.5–5.1)
Sodium: 141 mEq/L (ref 135–145)

## 2011-11-18 LAB — CBC WITH DIFFERENTIAL/PLATELET
Basophils Relative: 0.5 % (ref 0.0–3.0)
Eosinophils Relative: 2.6 % (ref 0.0–5.0)
HCT: 44.3 % (ref 36.0–46.0)
Hemoglobin: 14.5 g/dL (ref 12.0–15.0)
Lymphs Abs: 2.3 10*3/uL (ref 0.7–4.0)
MCV: 96.8 fl (ref 78.0–100.0)
Monocytes Absolute: 0.6 10*3/uL (ref 0.1–1.0)
Monocytes Relative: 7.5 % (ref 3.0–12.0)
Platelets: 300 10*3/uL (ref 150.0–400.0)
RBC: 4.58 Mil/uL (ref 3.87–5.11)
WBC: 7.7 10*3/uL (ref 4.5–10.5)

## 2011-11-18 LAB — HEPATIC FUNCTION PANEL
ALT: 6 U/L (ref 0–35)
AST: 34 U/L (ref 0–37)
Albumin: 3.9 g/dL (ref 3.5–5.2)
Total Bilirubin: 0.8 mg/dL (ref 0.3–1.2)

## 2011-11-18 LAB — LIPID PANEL
Cholesterol: 143 mg/dL (ref 0–200)
HDL: 46.9 mg/dL (ref >39.00)
Total CHOL/HDL Ratio: 3
Triglycerides: 243 mg/dL — ABNORMAL HIGH (ref 0.0–149.0)

## 2011-11-18 LAB — MICROALBUMIN / CREATININE URINE RATIO: Microalb, Ur: 3.2 mg/dL — ABNORMAL HIGH (ref 0.0–1.9)

## 2011-11-18 LAB — LDL CHOLESTEROL, DIRECT: Direct LDL: 70.8 mg/dL

## 2011-12-01 ENCOUNTER — Other Ambulatory Visit: Payer: Self-pay | Admitting: Family Medicine

## 2011-12-02 ENCOUNTER — Ambulatory Visit
Admission: RE | Admit: 2011-12-02 | Discharge: 2011-12-02 | Disposition: A | Discharge status: Home / Self Care | Payer: Medicare Other | Source: Ambulatory Visit | Attending: Family Medicine | Admitting: Family Medicine

## 2011-12-02 DIAGNOSIS — Z1231 Encounter for screening mammogram for malignant neoplasm of breast: Secondary | ICD-10-CM

## 2011-12-10 ENCOUNTER — Other Ambulatory Visit: Payer: Self-pay | Admitting: Family Medicine

## 2011-12-13 ENCOUNTER — Other Ambulatory Visit: Payer: Self-pay | Admitting: Family Medicine

## 2011-12-14 ENCOUNTER — Encounter: Payer: Self-pay | Admitting: Neurology

## 2011-12-14 ENCOUNTER — Ambulatory Visit (INDEPENDENT_AMBULATORY_CARE_PROVIDER_SITE_OTHER): Payer: Medicare Other | Admitting: Neurology

## 2011-12-14 VITALS — BP 90/60 | HR 84 | Temp 98.2°F | Resp 16 | Ht 65.5 in | Wt 220.0 lb

## 2011-12-14 DIAGNOSIS — T43505A Adverse effect of unspecified antipsychotics and neuroleptics, initial encounter: Secondary | ICD-10-CM

## 2011-12-14 DIAGNOSIS — T43591A Poisoning by other antipsychotics and neuroleptics, accidental (unintentional), initial encounter: Secondary | ICD-10-CM | POA: Diagnosis not present

## 2011-12-14 DIAGNOSIS — G4700 Insomnia, unspecified: Secondary | ICD-10-CM | POA: Diagnosis not present

## 2011-12-14 DIAGNOSIS — G2401 Drug induced subacute dyskinesia: Secondary | ICD-10-CM | POA: Diagnosis not present

## 2011-12-14 NOTE — Patient Instructions (Addendum)
1.  I will see you in 6 months 2.  Per your pharmacy records, it does not appear that you are on sinemet (carbidopa/levodopa) now.  Stay off of it. 3.  Call me if you have questions/concerns.
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
## 2011-12-16 ENCOUNTER — Other Ambulatory Visit: Payer: Self-pay | Admitting: Family Medicine

## 2011-12-16 MED ORDER — CLOTRIMAZOLE-BETAMETHASONE 1-0.05 % EX CREA: TOPICAL_CREAM | Freq: Two times a day (BID) | CUTANEOUS | Status: DC

## 2011-12-16 NOTE — Telephone Encounter (Signed)
refill  Clotrimazole-Betamethasone (Cream) 1-0.05 % APLLY TWICE A DAY AS DIRECTED #30--last fill 10.16.12--last ov 10.04.13

## 2011-12-17 ENCOUNTER — Encounter: Payer: Self-pay | Admitting: Gastroenterology

## 2012-01-04 DIAGNOSIS — H01009 Unspecified blepharitis unspecified eye, unspecified eyelid: Secondary | ICD-10-CM | POA: Diagnosis not present

## 2012-01-05 ENCOUNTER — Other Ambulatory Visit: Payer: Self-pay | Admitting: Family Medicine

## 2012-01-06 ENCOUNTER — Other Ambulatory Visit: Payer: Self-pay | Admitting: *Deleted

## 2012-01-06 MED ORDER — VALACYCLOVIR HCL 1 G PO TABS: 1000.0000 mg | ORAL_TABLET | Freq: Three times a day (TID) | ORAL | Status: DC

## 2012-01-06 NOTE — Telephone Encounter (Signed)
Please advise      KP 

## 2012-02-02 ENCOUNTER — Other Ambulatory Visit: Payer: Self-pay | Admitting: Family Medicine

## 2012-02-02 DIAGNOSIS — I10 Essential (primary) hypertension: Secondary | ICD-10-CM

## 2012-02-02 NOTE — Telephone Encounter (Signed)
refill Levothyroxine (Tab) D 125 MCG Take 1 tablet (125 mcg total) by mouth daily. #30 wt/6-refills last fill 11.26.13

## 2012-02-02 NOTE — Telephone Encounter (Signed)
Refill for Benicar sent to pharmacy 

## 2012-02-04 MED ORDER — LEVOTHYROXINE SODIUM 125 MCG PO TABS: 125.0000 ug | ORAL_TABLET | Freq: Every day | ORAL | Status: DC

## 2012-02-04 NOTE — Telephone Encounter (Signed)
Refill done.  

## 2012-02-07 ENCOUNTER — Other Ambulatory Visit: Payer: Self-pay | Admitting: Family Medicine

## 2012-02-08 ENCOUNTER — Telehealth: Payer: Self-pay

## 2012-02-08 NOTE — Telephone Encounter (Signed)
Error.     KP 

## 2012-02-16 DIAGNOSIS — H01009 Unspecified blepharitis unspecified eye, unspecified eyelid: Secondary | ICD-10-CM | POA: Diagnosis not present

## 2012-02-17 DIAGNOSIS — Z79899 Other long term (current) drug therapy: Secondary | ICD-10-CM | POA: Diagnosis not present

## 2012-02-28 DIAGNOSIS — I10 Essential (primary) hypertension: Secondary | ICD-10-CM | POA: Diagnosis not present

## 2012-02-28 DIAGNOSIS — K2970 Gastritis, unspecified, without bleeding: Secondary | ICD-10-CM | POA: Diagnosis not present

## 2012-02-28 DIAGNOSIS — R262 Difficulty in walking, not elsewhere classified: Secondary | ICD-10-CM | POA: Diagnosis not present

## 2012-02-28 DIAGNOSIS — K5900 Constipation, unspecified: Secondary | ICD-10-CM | POA: Diagnosis not present

## 2012-02-28 DIAGNOSIS — M549 Dorsalgia, unspecified: Secondary | ICD-10-CM | POA: Diagnosis not present

## 2012-02-28 DIAGNOSIS — E119 Type 2 diabetes mellitus without complications: Secondary | ICD-10-CM | POA: Diagnosis not present

## 2012-02-28 DIAGNOSIS — N201 Calculus of ureter: Secondary | ICD-10-CM | POA: Diagnosis not present

## 2012-02-28 DIAGNOSIS — R1031 Right lower quadrant pain: Secondary | ICD-10-CM | POA: Diagnosis not present

## 2012-02-28 DIAGNOSIS — R52 Pain, unspecified: Secondary | ICD-10-CM | POA: Diagnosis not present

## 2012-02-28 DIAGNOSIS — M79609 Pain in unspecified limb: Secondary | ICD-10-CM | POA: Diagnosis not present

## 2012-03-01 ENCOUNTER — Encounter: Payer: Self-pay | Admitting: Family Medicine

## 2012-03-01 ENCOUNTER — Ambulatory Visit (INDEPENDENT_AMBULATORY_CARE_PROVIDER_SITE_OTHER): Payer: Medicare Other | Admitting: Family Medicine

## 2012-03-01 ENCOUNTER — Ambulatory Visit (INDEPENDENT_AMBULATORY_CARE_PROVIDER_SITE_OTHER)
Admission: RE | Admit: 2012-03-01 | Discharge: 2012-03-01 | Disposition: A | Discharge status: Home / Self Care | Payer: Medicare Other | Source: Ambulatory Visit | Attending: Family Medicine | Admitting: Family Medicine

## 2012-03-01 ENCOUNTER — Telehealth: Payer: Self-pay | Admitting: Family Medicine

## 2012-03-01 VITALS — BP 138/86 | HR 79 | Temp 98.7°F | Wt 214.2 lb

## 2012-03-01 DIAGNOSIS — M25559 Pain in unspecified hip: Secondary | ICD-10-CM

## 2012-03-01 DIAGNOSIS — R911 Solitary pulmonary nodule: Secondary | ICD-10-CM

## 2012-03-01 DIAGNOSIS — M47817 Spondylosis without myelopathy or radiculopathy, lumbosacral region: Secondary | ICD-10-CM | POA: Diagnosis not present

## 2012-03-01 DIAGNOSIS — M4310 Spondylolisthesis, site unspecified: Secondary | ICD-10-CM | POA: Diagnosis not present

## 2012-03-01 DIAGNOSIS — M169 Osteoarthritis of hip, unspecified: Secondary | ICD-10-CM | POA: Diagnosis not present

## 2012-03-01 DIAGNOSIS — M5137 Other intervertebral disc degeneration, lumbosacral region: Secondary | ICD-10-CM | POA: Diagnosis not present

## 2012-03-01 MED ORDER — VALACYCLOVIR HCL 1 G PO TABS: 1000.0000 mg | ORAL_TABLET | Freq: Three times a day (TID) | ORAL | Status: DC

## 2012-03-01 MED ORDER — CYCLOBENZAPRINE HCL 10 MG PO TABS: 10.0000 mg | ORAL_TABLET | Freq: Three times a day (TID) | ORAL | Status: DC | PRN

## 2012-03-01 NOTE — Patient Instructions (Addendum)
Hip Pain  The hips join the upper legs to the lower pelvis. The bones, cartilage, tendons, and muscles of the hip joint perform a lot of work each day holding your body weight and allowing you to move around.  Hip pain is a common symptom. It can range from a minor ache to severe pain on 1 or both hips. Pain may be felt on the inside of the hip joint near the groin, or the outside near the buttocks and upper thigh. There may be swelling or stiffness as well. It occurs more often when a person walks or performs activity. There are many reasons hip pain can develop.  CAUSES   It is important to work with your caregiver to identify the cause since many conditions can impact the bones, cartilage, muscles, and tendons of the hips. Causes for hip pain include:   Broken (fractured) bones.   Separation of the thighbone from the hip socket (dislocation).   Torn cartilage of the hip joint.   Swelling (inflammation) of a tendon (tendonitis), the sac within the hip joint (bursitis), or a joint.   A weakening in the abdominal wall (hernia), affecting the nerves to the hip.   Arthritis in the hip joint or lining of the hip joint.   Pinched nerves in the back, hip, or upper thigh.   A bulging disc in the spine (herniated disc).   Rarely, bone infection or cancer.  DIAGNOSIS   The location of your hip pain will help your caregiver understand what may be causing the pain. A diagnosis is based on your medical history, your symptoms, results from your physical exam, and results from diagnostic tests. Diagnostic tests may include X-ray exams, a computerized magnetic scan (magnetic resonance imaging, MRI), or bone scan.  TREATMENT   Treatment will depend on the cause of your hip pain. Treatment may include:   Limiting activities and resting until symptoms improve.   Crutches or other walking supports (a cane or brace).   Ice, elevation, and compression.   Physical therapy or home exercises.    Shoe inserts or special shoes.   Losing weight.   Medications to reduce pain.   Undergoing surgery.  HOME CARE INSTRUCTIONS    Only take over-the-counter or prescription medicines for pain, discomfort, or fever as directed by your caregiver.   Put ice on the injured area:   Put ice in a plastic bag.   Place a towel between your skin and the bag.   Leave the ice on for 15 to 20 minutes at a time, 3 to 4 times a day.   Keep your leg raised (elevated) when possible to lessen swelling.   Avoid activities that cause pain.   Follow specific exercises as directed by your caregiver.   Sleep with a pillow between your legs on your most comfortable side.   Record how often you have hip pain, the location of the pain, and what it feels like. This information may be helpful to you and your caregiver.   Ask your caregiver about returning to work or sports and whether you should drive.   Follow up with your caregiver for further exams, therapy, or testing as directed.  SEEK MEDICAL CARE IF:    Your pain or swelling continues or worsens after 1 week.   You are feeling unwell or have chills.   You have increasing difficulty with walking.   You have a loss of sensation or other new symptoms.   You have questions   or concerns.  SEEK IMMEDIATE MEDICAL CARE IF:    You cannot put weight on the affected hip.   You have fallen.   You have a sudden increase in pain and swelling in your hip.   You have a fever.  MAKE SURE YOU:    Understand these instructions.   Will watch your condition.   Will get help right away if you are not doing well or get worse.  Document Released: 07/16/2009 Document Revised: 04/20/2011 Document Reviewed: 07/16/2009  [employer] Patient Information 2013 [employer].

## 2012-03-01 NOTE — Telephone Encounter (Signed)
Patient's daughter states there should be a dvd of the scans from the ED-hospital noted nodule on lower right lobe of lung and was told to bring this to the doctor's attention.

## 2012-03-01 NOTE — Telephone Encounter (Signed)
Patient did not bring the ED infomation with her for Dr.Lowne to review. I will make daughter aware    KP

## 2012-03-01 NOTE — Progress Notes (Signed)
  Subjective:    [name] is a 73 y.o. female who presents with right hip pain. Onset of the symptoms was a week ago. Inciting event: none. The patient reports the hip pain is worse with weight bearing and is aggravated by walking. Aggravating symptoms include: any weight bearing. Patient has had no prior hip problems. Previous visits for this problem: none. Evaluation to date: ER visit with MRI and labs , [place] leg per pt and pt daughter. All normal. Treatment to date: prescription analgesics, which have been temporarily effective.  The following portions of the patient's history were reviewed and updated as appropriate: allergies, current medications, past family history, past medical history, past social history, past surgical history and problem list.   Review of Systems Pertinent items are noted in HPI.   Objective:    BP 138/86  Pulse 79  Temp 98.7 F (37.1 C) (Oral)  Wt 214 lb 3.2 oz (97.16 kg)  SpO2 97% Right hip: positives: pain with movement of hip and palpation of hip and thigh on right and negatives: FROM good strength low ext and hip  Left hip: normal   Imaging: X-ray the right hip: not available    Assessment:    right hip pain    Plan:    Educational materials distributed. X-rays per orders. con't meds from ER in [place]

## 2012-03-01 NOTE — Telephone Encounter (Signed)
Dr.Lowne called and spoke with daughter----  KP

## 2012-03-01 NOTE — Telephone Encounter (Signed)
Pt has appt today with Dr. Laury Axon @ 330p.

## 2012-03-01 NOTE — Telephone Encounter (Signed)
Patient Information:  Caller Name: Paulene Floor  Phone: [phone]  Patient: [name], [name]  Gender: Female  DOB: 1940/03/01  Age: 72 Years  PCP: Lelon Perla.  Office Follow Up:  Does the office need to follow up with this patient?: Yes  Instructions For The Office: Please check with MD and advise pt if based on her sx she can wait until 3:30 or go to ED now. (RN unable to make decision based on ED disposition)   Symptoms  Reason For Call & Symptoms: Pt was scheduled an appt today at 3:30 - she is having excrutiating pain on lower right hand side of her abdomen. Pt was out of town on Sunday when she developed the pain and went to the hospital in [place]. She tells the RN that she has had the abdominal pain mildly x several days prior to Sunday but it was severe then and they took her to the hospital in an ambulance.  Pt had a MRI; x-ray and blood work all of which were normal but pt continues to have the pain which is worse today.  Reviewed Health History In EMR: Yes  Reviewed Medications In EMR: Yes  Reviewed Allergies In EMR: Yes  Reviewed Surgeries / Procedures: Yes  Date of Onset of Symptoms: 02/28/2012  Guideline(s) Used:  Abdominal Pain - Female  Disposition Per Guideline:   Go to ED Now  Reason For Disposition Reached:   Severe abdominal pain (e.g., excruciating)  Advice Given:  N/A  Patient Refused Recommendation:  Patient has an appt today at 3:30.  Pt wants to be seen in the office since she was already at a hospital for extensive testing.

## 2012-03-03 ENCOUNTER — Telehealth: Payer: Self-pay | Admitting: Family Medicine

## 2012-03-03 NOTE — Telephone Encounter (Signed)
Spoke with [name] wanted to know if the pain could be shingles without the rash or a pinched nerve. She is concerned about mother being on pain meds and becoming addicted again.    KP

## 2012-03-03 NOTE — Telephone Encounter (Signed)
She has had it for a while already so it should have broken out already.    xrays were neg.  We can refer her to ortho if hip still bothers her after trying ultram---- I did not give her a narcotic.  I'm waiting for records from hospital.

## 2012-03-03 NOTE — Telephone Encounter (Signed)
Pts Daughter called stated that she would like to speak with you regarding her moms pain -- Gwyneth Revels noted she has had some thoughts and just wants to review please call on cell# [phone] (note she will be unavailable from 12:45 to 1:45)

## 2012-03-04 MED ORDER — TIZANIDINE HCL 4 MG PO CAPS: 4.0000 mg | ORAL_CAPSULE | Freq: Four times a day (QID) | ORAL | Status: DC

## 2012-03-04 NOTE — Telephone Encounter (Signed)
Detailed msg left on [name] VM with Dr.Lowne's recommendations. Patient has been made aware the Rx has been faxed the to pharmacy and she voiced udnerstanding    KP

## 2012-03-04 NOTE — Telephone Encounter (Signed)
PA is pending--- Dr.Lowne has approved an alternative of Tizanidine 4 mg 1 every 6 hours #60 with no refills.

## 2012-03-06 ENCOUNTER — Emergency Department (HOSPITAL_COMMUNITY)
Admission: EM | Admit: 2012-03-06 | Discharge: 2012-03-06 | Disposition: A | Discharge status: Home / Self Care | Payer: Medicare Other | Attending: Emergency Medicine | Admitting: Emergency Medicine

## 2012-03-06 ENCOUNTER — Encounter (HOSPITAL_COMMUNITY): Payer: Self-pay | Admitting: Nurse Practitioner

## 2012-03-06 DIAGNOSIS — E119 Type 2 diabetes mellitus without complications: Secondary | ICD-10-CM | POA: Diagnosis not present

## 2012-03-06 DIAGNOSIS — F411 Generalized anxiety disorder: Secondary | ICD-10-CM | POA: Insufficient documentation

## 2012-03-06 DIAGNOSIS — Z8679 Personal history of other diseases of the circulatory system: Secondary | ICD-10-CM | POA: Diagnosis not present

## 2012-03-06 DIAGNOSIS — Z87891 Personal history of nicotine dependence: Secondary | ICD-10-CM | POA: Insufficient documentation

## 2012-03-06 DIAGNOSIS — E039 Hypothyroidism, unspecified: Secondary | ICD-10-CM | POA: Diagnosis not present

## 2012-03-06 DIAGNOSIS — Z7982 Long term (current) use of aspirin: Secondary | ICD-10-CM | POA: Insufficient documentation

## 2012-03-06 DIAGNOSIS — Z8669 Personal history of other diseases of the nervous system and sense organs: Secondary | ICD-10-CM | POA: Insufficient documentation

## 2012-03-06 DIAGNOSIS — Z862 Personal history of diseases of the blood and blood-forming organs and certain disorders involving the immune mechanism: Secondary | ICD-10-CM | POA: Insufficient documentation

## 2012-03-06 DIAGNOSIS — R6889 Other general symptoms and signs: Secondary | ICD-10-CM | POA: Diagnosis not present

## 2012-03-06 DIAGNOSIS — M545 Low back pain: Secondary | ICD-10-CM | POA: Diagnosis not present

## 2012-03-06 DIAGNOSIS — Z9181 History of falling: Secondary | ICD-10-CM | POA: Diagnosis not present

## 2012-03-06 DIAGNOSIS — M25559 Pain in unspecified hip: Secondary | ICD-10-CM

## 2012-03-06 DIAGNOSIS — Z87440 Personal history of urinary (tract) infections: Secondary | ICD-10-CM | POA: Insufficient documentation

## 2012-03-06 DIAGNOSIS — Z8719 Personal history of other diseases of the digestive system: Secondary | ICD-10-CM | POA: Insufficient documentation

## 2012-03-06 DIAGNOSIS — Z79899 Other long term (current) drug therapy: Secondary | ICD-10-CM | POA: Insufficient documentation

## 2012-03-06 DIAGNOSIS — Z8739 Personal history of other diseases of the musculoskeletal system and connective tissue: Secondary | ICD-10-CM | POA: Diagnosis not present

## 2012-03-06 DIAGNOSIS — Z872 Personal history of diseases of the skin and subcutaneous tissue: Secondary | ICD-10-CM | POA: Insufficient documentation

## 2012-03-06 DIAGNOSIS — E785 Hyperlipidemia, unspecified: Secondary | ICD-10-CM | POA: Insufficient documentation

## 2012-03-06 DIAGNOSIS — I10 Essential (primary) hypertension: Secondary | ICD-10-CM | POA: Insufficient documentation

## 2012-03-06 DIAGNOSIS — Z8659 Personal history of other mental and behavioral disorders: Secondary | ICD-10-CM | POA: Diagnosis not present

## 2012-03-06 DIAGNOSIS — K219 Gastro-esophageal reflux disease without esophagitis: Secondary | ICD-10-CM | POA: Diagnosis not present

## 2012-03-06 DIAGNOSIS — Z8709 Personal history of other diseases of the respiratory system: Secondary | ICD-10-CM | POA: Diagnosis not present

## 2012-03-06 DIAGNOSIS — Z8639 Personal history of other endocrine, nutritional and metabolic disease: Secondary | ICD-10-CM | POA: Insufficient documentation

## 2012-03-06 DIAGNOSIS — M79609 Pain in unspecified limb: Secondary | ICD-10-CM | POA: Diagnosis not present

## 2012-03-06 MED ORDER — HYDROMORPHONE HCL 4 MG PO TABS: 4.0000 mg | ORAL_TABLET | Freq: Four times a day (QID) | ORAL | Status: DC | PRN

## 2012-03-06 MED ORDER — HYDROMORPHONE HCL PF 1 MG/ML IJ SOLN
1.0000 mg | Freq: Once | INTRAMUSCULAR | Status: AC
  Administered 2012-03-06: 1 mg via INTRAMUSCULAR
  Filled 2012-03-06: qty 1

## 2012-03-06 NOTE — ED Notes (Signed)
Per ems: pt called for back pain radiating into R hip and down R leg for past 2 weeks. Seen at [facility] ER and PCP for same with no diagnosis. Pt took muscle relaxer this afternoon but didn't help the pain and she ran out of percocet. vss en route. ambulatory

## 2012-03-06 NOTE — ED Provider Notes (Signed)
History     CSN: [id number]  Arrival date & time 03/06/12  Jul 16, 1500   First MD Initiated Contact with Patient 03/06/12 1504      Chief Complaint  Patient presents with  . Back Pain    (Consider location/radiation/quality/duration/timing/severity/associated sxs/prior treatment) Patient is a 72 y.o. female presenting with back pain. The history is provided by the patient (pt complains of right hip and back pain). No language interpreter was used.  Back Pain  This is a recurrent problem. The current episode started 2 days ago. The problem occurs constantly. The problem has not changed since onset.The pain is associated with no known injury. The pain is present in the lumbar spine. The quality of the pain is described as stabbing. The pain does not radiate. The pain is at a severity of 5/10. The pain is moderate. The symptoms are aggravated by bending. Pertinent negatives include no chest pain, no headaches and no abdominal pain.    Past Medical History  Diagnosis Date  . Genital herpes, unspecified   . Abdominal pain, unspecified site   . Other specified intestinal malabsorption   . Esophageal reflux   . Obstructive sleep apnea (adult) (pediatric)      - PSG 09/05/09 AHI 14.2  . Memory loss   . Backache, unspecified   . Benign neoplasm of skin, site unspecified   . Tachycardia, unspecified   . Altered mental status   . Sinus tachycardia   . Anxiety   . Circadian rhythm sleep disorder, delayed sleep phase type   . Restless legs syndrome (RLS)   . Insomnia, unspecified   . Urinary tract infection, site not specified   . Concussion with no loss of consciousness   . Generalized osteoarthrosis, unspecified site   . Duodenitis without mention of hemorrhage   . Diaphragmatic hernia without mention of obstruction or gangrene   . Personal history of colonic polyps   . Diverticulosis of colon (without mention of hemorrhage)   . Diarrhea   . Allergic rhinitis due to pollen   . Viral  meningitis   . Delirium   . DM II (diabetes mellitus, type II), controlled   . Other and unspecified hyperlipidemia   . Mole (skin)   . 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 Osteoporosis, unspecified   . Shingles   . Routine general medical examination at a health care facility   . Impacted cerumen   . Unspecified fall   . Other abnormal blood chemistry   . Family history of diabetes mellitus   . Family history of psychiatric condition   . Shortness of breath   . Bilateral leg edema   . Obsessive-compulsive disorders   . Unspecified hypothyroidism   . Unspecified essential hypertension   . Gout, unspecified     Past Surgical History  Procedure Date  . Carpal tunnel release 2005    bilateral  . Nasal sinus surgery 2000  . Colonoscopy 1991, 3.17.2008  . Laminectomy 11.15.07    L3-L4  . Cataract surgery both eyes Jul 17, 2011    Family History  Problem Relation Age of Onset  . Alzheimer's disease Mother 35    died July 17, 2006  . Mental illness Mother     dementia  . Depression Other   . Diabetes Other   . Cancer Other     breast, colon, kidney    History  Substance Use Topics  . Smoking status: Former Smoker    Quit date: 04/16/1970  .  Smokeless tobacco: Never Used  . Alcohol Use: No    OB History    Grav Para Term Preterm Abortions TAB SAB Ect Mult Living                  Review of Systems  Constitutional: Negative for fatigue.  HENT: Negative for congestion, sinus pressure and ear discharge.   Eyes: Negative for discharge.  Respiratory: Negative for cough.   Cardiovascular: Negative for chest pain.  Gastrointestinal: Negative for abdominal pain and diarrhea.  Genitourinary: Negative for frequency and hematuria.  Musculoskeletal: Positive for back pain.       Right hip pain  Skin: Negative for rash.  Neurological: Negative for seizures and headaches.  Hematological: Negative.   Psychiatric/Behavioral: Negative for hallucinations.    Allergies   Codeine  Home Medications   Current Outpatient Rx  Name  Route  Sig  Dispense  Refill  . ALENDRONATE SODIUM 70 MG PO TABS   Oral   Take 70 mg by mouth every 7 (seven) days. Takes on Fri         . AMLODIPINE BESYLATE 5 MG PO TABS   Oral   Take 5 mg by mouth every morning.         Marland Kitchen BIOTENE MOISTURIZING MOUTH MT SOLN   Mouth/Throat   Use as directed in the mouth or throat daily. Swish and spit. Exact amount taken unknown.         . ASPIRIN EC 81 MG PO TBEC   Oral   Take 81 mg by mouth every morning.          . ATORVASTATIN CALCIUM 20 MG PO TABS   Oral   Take 20 mg by mouth every evening.         Marland Kitchen CARBIDOPA-LEVODOPA 25-100 MG PO TABS   Oral   Take 1 tablet by mouth every morning.         . CELECOXIB 200 MG PO CAPS   Oral   Take 200 mg by mouth every evening.         Marland Kitchen CLONAZEPAM 0.5 MG PO TABS   Oral   Take 0.5 mg by mouth every evening.         Marland Kitchen COLCHICINE 0.6 MG PO TABS   Oral   Take 0.6 mg by mouth daily as needed. For gout         . ESOMEPRAZOLE MAGNESIUM 40 MG PO CPDR   Oral   Take 40 mg by mouth daily before breakfast.         . FEXOFENADINE HCL 180 MG PO TABS   Oral   Take 180 mg by mouth every evening.          Marland Kitchen HYDROCHLOROTHIAZIDE 25 MG PO TABS   Oral   Take 25 mg by mouth every morning.         Marland Kitchen LEVOTHYROXINE SODIUM 125 MCG PO TABS   Oral   Take 125 mcg by mouth every morning.         Marland Kitchen LEXAPRO 20 MG PO TABS   Oral   Take 20 mg by mouth every evening.          Marland Kitchen METFORMIN HCL 500 MG PO TABS   Oral   Take 1,000 mg by mouth every evening.         . MOMETASONE FUROATE 50 MCG/ACT NA SUSP   Nasal   Place 2 sprays into the nose daily as needed. For nasal congestion         .  MULTIVITAMINS PO TABS   Oral   Take 1 tablet by mouth every evening.          Marland Kitchen OLMESARTAN MEDOXOMIL 20 MG PO TABS   Oral   Take 20 mg by mouth every morning.         Marland Kitchen TIZANIDINE HCL 4 MG PO CAPS   Oral   Take 1 capsule (4  mg total) by mouth every 6 (six) hours.   60 capsule   0   . ULORIC 40 MG PO TABS   Oral   Take 1 tablet by mouth every morning.          Marland Kitchen VALACYCLOVIR HCL 1 G PO TABS   Oral   Take 1,000 mg by mouth 3 (three) times daily as needed. For Herpes flare up         . VITAMIN E 400 UNITS PO CAPS   Oral   Take 400 Units by mouth daily.           Marland Kitchen HYDROMORPHONE HCL 4 MG PO TABS   Oral   Take 1 tablet (4 mg total) by mouth every 6 (six) hours as needed for pain.   30 tablet   0     BP 108/89  Pulse 70  Temp 99.1 F (37.3 C) (Oral)  Resp 16  Ht 5\' 7"  (1.702 m)  Wt 217 lb (98.431 kg)  BMI 33.99 kg/m2  SpO2 95%  Physical Exam  Constitutional: She is oriented to person, place, and time. She appears well-developed.  HENT:  Head: Normocephalic and atraumatic.  Eyes: Conjunctivae normal and EOM are normal. No scleral icterus.  Neck: Neck supple. No thyromegaly present.  Cardiovascular: Normal rate and regular rhythm.  Exam reveals no gallop and no friction rub.   No murmur heard. Pulmonary/Chest: No stridor. She has no wheezes. She has no rales. She exhibits no tenderness.  Abdominal: She exhibits no distension. There is no tenderness. There is no rebound.  Musculoskeletal: Normal range of motion. She exhibits no edema.       Tender right hip and groin  Lymphadenopathy:    She has no cervical adenopathy.  Neurological: She is oriented to person, place, and time. Coordination normal.  Skin: No rash noted. No erythema.  Psychiatric: She has a normal mood and affect. Her behavior is normal.    ED Course  Procedures (including critical care time)  Labs Reviewed - No data to display No results found.   1. Hip pain    Pt had a normal hip x-ray.  Normal ct abd pelvis,  djd lumbar spine   MDM          Benny Lennert, MD 03/06/12 1720

## 2012-03-07 ENCOUNTER — Other Ambulatory Visit: Payer: Self-pay | Admitting: Family Medicine

## 2012-03-07 ENCOUNTER — Telehealth: Payer: Self-pay | Admitting: Family Medicine

## 2012-03-07 DIAGNOSIS — M549 Dorsalgia, unspecified: Secondary | ICD-10-CM

## 2012-03-07 NOTE — Telephone Encounter (Signed)
Patient is requesting referral for her back. She is okay with anyone other than Dr. Juliene Pina with [facility].

## 2012-03-07 NOTE — Telephone Encounter (Signed)
Please advise on request for referral to Ortho for her back.//AB/CMA

## 2012-03-10 DIAGNOSIS — M25549 Pain in joints of unspecified hand: Secondary | ICD-10-CM | POA: Diagnosis not present

## 2012-03-10 DIAGNOSIS — M1710 Unilateral primary osteoarthritis, unspecified knee: Secondary | ICD-10-CM | POA: Diagnosis not present

## 2012-03-10 DIAGNOSIS — M5137 Other intervertebral disc degeneration, lumbosacral region: Secondary | ICD-10-CM | POA: Diagnosis not present

## 2012-03-10 DIAGNOSIS — M2550 Pain in unspecified joint: Secondary | ICD-10-CM | POA: Diagnosis not present

## 2012-03-10 DIAGNOSIS — M76899 Other specified enthesopathies of unspecified lower limb, excluding foot: Secondary | ICD-10-CM | POA: Diagnosis not present

## 2012-03-16 ENCOUNTER — Other Ambulatory Visit: Payer: Self-pay | Admitting: Family Medicine

## 2012-03-30 ENCOUNTER — Telehealth: Payer: Self-pay | Admitting: Family Medicine

## 2012-03-30 MED ORDER — METFORMIN HCL 500 MG PO TABS: ORAL_TABLET | ORAL | Status: DC

## 2012-03-30 NOTE — Telephone Encounter (Signed)
refill MetFORMIN HCl (Tab) 500 MG Take 1,000 mg by mouth every evening. #60 wt/2-refills last fill 12.29.13

## 2012-04-06 ENCOUNTER — Telehealth: Payer: Self-pay | Admitting: Family Medicine

## 2012-04-06 MED ORDER — ESCITALOPRAM OXALATE 20 MG PO TABS: 20.0000 mg | ORAL_TABLET | Freq: Every evening | ORAL | Status: DC

## 2012-04-06 NOTE — Telephone Encounter (Signed)
refill  Escitalopram (Tab) 20 MG Take 20 mg by mouth every evening #60 wt/2-refills last fill 8.3.13

## 2012-04-12 ENCOUNTER — Telehealth: Payer: Self-pay | Admitting: Family Medicine

## 2012-04-12 MED ORDER — ALENDRONATE SODIUM 70 MG PO TABS: 70.0000 mg | ORAL_TABLET | ORAL | Status: DC

## 2012-04-12 NOTE — Telephone Encounter (Signed)
Refill: Alendronate sodium 70 mg tab. Take 1 tablet by mouth every 7 days. Qty 4. Last fill 03-15-12

## 2012-04-18 ENCOUNTER — Other Ambulatory Visit: Payer: Self-pay | Admitting: Family Medicine

## 2012-04-26 ENCOUNTER — Other Ambulatory Visit: Payer: Self-pay | Admitting: Family Medicine

## 2012-05-04 ENCOUNTER — Telehealth: Payer: Self-pay | Admitting: Family Medicine

## 2012-05-04 MED ORDER — ESOMEPRAZOLE MAGNESIUM 40 MG PO CPDR: 40.0000 mg | DELAYED_RELEASE_CAPSULE | Freq: Every day | ORAL | Status: DC

## 2012-05-04 NOTE — Telephone Encounter (Signed)
REFILLS ON NEXIUM DR 40 MG CAPSULE # 30 SIG; TAKE ONE CAPSULE BY MOUTH 30 MINUTES BEFORE FIRST MEAL OF THE DAY  LAST FILLED 02.27.2014    AUTH REFILLS:4

## 2012-05-05 ENCOUNTER — Telehealth: Payer: Self-pay | Admitting: Family Medicine

## 2012-05-05 MED ORDER — METFORMIN HCL 500 MG PO TABS: ORAL_TABLET | ORAL | Status: DC

## 2012-05-05 NOTE — Telephone Encounter (Signed)
REFILLS NEEDED ON METFORMINHCL 500 MG TABLET #60  SIG: TAKE 2 TABLETS BY MOUTH DAILY  LAST FILLED : 02.19.2014    AUTH REFILLS :0                    * REPEAT LABS ARE DUE *

## 2012-05-05 NOTE — Telephone Encounter (Signed)
Rx sent, Letter Mail  

## 2012-05-10 DIAGNOSIS — H35379 Puckering of macula, unspecified eye: Secondary | ICD-10-CM | POA: Diagnosis not present

## 2012-05-16 ENCOUNTER — Encounter: Payer: Self-pay | Admitting: Lab

## 2012-05-17 ENCOUNTER — Encounter: Payer: Self-pay | Admitting: Family Medicine

## 2012-05-17 ENCOUNTER — Ambulatory Visit (INDEPENDENT_AMBULATORY_CARE_PROVIDER_SITE_OTHER): Payer: Medicare Other | Admitting: Family Medicine

## 2012-05-17 VITALS — BP 120/72 | HR 91 | Temp 98.6°F | Wt 192.0 lb

## 2012-05-17 DIAGNOSIS — E785 Hyperlipidemia, unspecified: Secondary | ICD-10-CM | POA: Diagnosis not present

## 2012-05-17 DIAGNOSIS — E039 Hypothyroidism, unspecified: Secondary | ICD-10-CM

## 2012-05-17 DIAGNOSIS — M109 Gout, unspecified: Secondary | ICD-10-CM

## 2012-05-17 DIAGNOSIS — I10 Essential (primary) hypertension: Secondary | ICD-10-CM | POA: Diagnosis not present

## 2012-05-17 DIAGNOSIS — E119 Type 2 diabetes mellitus without complications: Secondary | ICD-10-CM

## 2012-05-17 LAB — LIPID PANEL
LDL Cholesterol: 37 mg/dL (ref 0–99)
Total CHOL/HDL Ratio: 2
Triglycerides: 135 mg/dL (ref 0.0–149.0)

## 2012-05-17 LAB — POCT URINALYSIS DIPSTICK
Bilirubin, UA: NEGATIVE
Blood, UA: NEGATIVE
Nitrite, UA: NEGATIVE
Protein, UA: NEGATIVE
pH, UA: 6

## 2012-05-17 LAB — BASIC METABOLIC PANEL
CO2: 30 mEq/L (ref 19–32)
Calcium: 9.2 mg/dL (ref 8.4–10.5)
Chloride: 100 mEq/L (ref 96–112)
Glucose, Bld: 111 mg/dL — ABNORMAL HIGH (ref 70–99)
Potassium: 3.4 mEq/L — ABNORMAL LOW (ref 3.5–5.1)
Sodium: 139 mEq/L (ref 135–145)

## 2012-05-17 LAB — HEPATIC FUNCTION PANEL
ALT: 7 U/L (ref 0–35)
AST: 21 U/L (ref 0–37)
Alkaline Phosphatase: 68 U/L (ref 39–117)
Bilirubin, Direct: 0.1 mg/dL (ref 0.0–0.3)
Total Bilirubin: 0.7 mg/dL (ref 0.3–1.2)
Total Protein: 6.6 g/dL (ref 6.0–8.3)

## 2012-05-17 LAB — MICROALBUMIN / CREATININE URINE RATIO
Microalb Creat Ratio: 2 mg/g (ref 0.0–30.0)
Microalb, Ur: 2.6 mg/dL — ABNORMAL HIGH (ref 0.0–1.9)

## 2012-05-17 NOTE — Progress Notes (Signed)
  Subjective:    Patient ID: [name], female    DOB: 11-08-1940, 72 y.o.   MRN: [id number]  HPI HYPERTENSION Disease Monitoring Blood pressure range- not checkingChest pain- no      Dyspnea- no Medications Compliance- good Lightheadedness- no   Edema- no   DIABETES Disease Monitoring Blood Sugar ranges-not checking Polyuria- no New Visual problems- no Medications Compliance- good Hypoglycemic symptoms- no   HYPERLIPIDEMIA Disease Monitoring See symptoms for Hypertension Medications Compliance- good RUQ pain- no  Muscle aches- no  Pt also getting labs for thyroid and gout.  Pt only complains about soreness in both big toes and she states they turn purple.   No known trauma.   ROS See HPI above   PMH Smoking Status noted     Review of Systems As above    Objective:   Physical Exam  BP 120/72  Pulse 91  Temp(Src) 98.6 F (37 C) (Oral)  Wt 192 lb (87.091 kg)  BMI 30.06 kg/m2  SpO2 95% General appearance: alert, cooperative, appears stated age and no distress Lungs: clear to auscultation bilaterally Heart: S1, S2 normal Extremities: extremities normal, atraumatic, no cyanosis or edema Sensory exam of the foot is normal, tested with the monofilament. Good pulses, no lesions or ulcers, good peripheral pulses.      Assessment & Plan:

## 2012-05-17 NOTE — Assessment & Plan Note (Signed)
Check labs con't meds 

## 2012-05-17 NOTE — Assessment & Plan Note (Signed)
Stable con't meds 

## 2012-05-17 NOTE — Assessment & Plan Note (Signed)
Check labs 

## 2012-05-17 NOTE — Patient Instructions (Signed)

## 2012-05-18 ENCOUNTER — Other Ambulatory Visit: Payer: Self-pay | Admitting: Family Medicine

## 2012-05-19 DIAGNOSIS — Z79899 Other long term (current) drug therapy: Secondary | ICD-10-CM | POA: Diagnosis not present

## 2012-05-25 MED ORDER — POTASSIUM CHLORIDE CRYS ER 20 MEQ PO TBCR: 20.0000 meq | EXTENDED_RELEASE_TABLET | Freq: Every day | ORAL | Status: DC

## 2012-05-25 MED ORDER — LEVOTHYROXINE SODIUM 112 MCG PO TABS: 112.0000 ug | ORAL_TABLET | Freq: Every morning | ORAL | Status: DC

## 2012-05-30 ENCOUNTER — Telehealth: Payer: Self-pay | Admitting: Family Medicine

## 2012-05-30 NOTE — Telephone Encounter (Signed)
msg left to call the office     KP 

## 2012-05-30 NOTE — Telephone Encounter (Signed)
Patients daughter called in wanting to know exactly what potassium medication is used for? She wasn't able to come to moms last appointment and this medication was prescribed at that visit.

## 2012-06-02 ENCOUNTER — Other Ambulatory Visit: Payer: Self-pay | Admitting: Family Medicine

## 2012-06-03 NOTE — Telephone Encounter (Signed)
Spoke with daughter and she has been made aware the patient's K was low, she will follow up in 2 mos to recheck K when we recheck the TSH.     KP

## 2012-06-07 ENCOUNTER — Telehealth: Payer: Self-pay | Admitting: General Practice

## 2012-06-07 ENCOUNTER — Other Ambulatory Visit: Payer: Self-pay | Admitting: Family Medicine

## 2012-06-07 MED ORDER — ATORVASTATIN CALCIUM 20 MG PO TABS: 20.0000 mg | ORAL_TABLET | Freq: Every evening | ORAL | Status: DC

## 2012-06-07 NOTE — Telephone Encounter (Signed)
Refill lipitor for 3 months

## 2012-06-07 NOTE — Telephone Encounter (Signed)
Refill request for Atorvastatin received. Pt last seen on 05/17/12. This is a Historical Med please advise if ok to fill.

## 2012-06-08 DIAGNOSIS — M5137 Other intervertebral disc degeneration, lumbosacral region: Secondary | ICD-10-CM | POA: Diagnosis not present

## 2012-06-08 DIAGNOSIS — Z79899 Other long term (current) drug therapy: Secondary | ICD-10-CM | POA: Diagnosis not present

## 2012-06-08 DIAGNOSIS — M19049 Primary osteoarthritis, unspecified hand: Secondary | ICD-10-CM | POA: Diagnosis not present

## 2012-06-08 DIAGNOSIS — M1710 Unilateral primary osteoarthritis, unspecified knee: Secondary | ICD-10-CM | POA: Diagnosis not present

## 2012-06-08 DIAGNOSIS — M2550 Pain in unspecified joint: Secondary | ICD-10-CM | POA: Diagnosis not present

## 2012-06-08 DIAGNOSIS — M109 Gout, unspecified: Secondary | ICD-10-CM | POA: Diagnosis not present

## 2012-06-08 NOTE — Telephone Encounter (Signed)
Last seen 05/17/12 and filled 11/13/11 #60. Please advise      KP

## 2012-06-09 ENCOUNTER — Encounter: Payer: Self-pay | Admitting: Family Medicine

## 2012-06-10 ENCOUNTER — Other Ambulatory Visit: Payer: Self-pay | Admitting: General Practice

## 2012-06-10 MED ORDER — CELECOXIB 200 MG PO CAPS: ORAL_CAPSULE | ORAL | Status: DC

## 2012-06-10 NOTE — Telephone Encounter (Signed)
Med filled.  

## 2012-06-13 ENCOUNTER — Telehealth: Payer: Self-pay

## 2012-06-13 DIAGNOSIS — E876 Hypokalemia: Secondary | ICD-10-CM

## 2012-06-13 DIAGNOSIS — E0590 Thyrotoxicosis, unspecified without thyrotoxic crisis or storm: Secondary | ICD-10-CM

## 2012-06-13 NOTE — Telephone Encounter (Signed)
TSH for hyperthyroidism and BMP for Hypokalemia.      KP

## 2012-06-13 NOTE — Telephone Encounter (Signed)
Message copied by Arnette Norris on Mon Jun 13, 2012  5:22 PM ------      Message from: [name]      Created: Mon Jun 13, 2012  4:43 PM       Patient called to schedule lab appt for June. What labs is she due for? ------

## 2012-06-15 ENCOUNTER — Telehealth: Payer: Self-pay | Admitting: Family Medicine

## 2012-06-15 NOTE — Telephone Encounter (Signed)
Patient's daughter called stating the patient was prescribed carbidoba-levodopa 25-100mg  by her neurologist for tremors. Pts neurologist has left the practice and they want Dr. Laury Axon to prescribe this medication for her. She takes 1 tablet daily. Pt uses [facility] on [address]

## 2012-06-15 NOTE — Telephone Encounter (Signed)
Please advise      KP 

## 2012-06-15 NOTE — Telephone Encounter (Signed)
Dr Tat saw her last and she stopped med and asked her to return in 6months from then.

## 2012-06-15 NOTE — Telephone Encounter (Signed)
Discussed with daughter and she voiced understanding, she will call Dr.Tat's office     KP

## 2012-06-16 ENCOUNTER — Telehealth: Payer: Self-pay

## 2012-06-16 NOTE — Telephone Encounter (Signed)
Pt's daughter called her pcp to get refill of her sinemet and was told it had been d/c at her last ov and asked to call here for clarity.  Per last ov you did instruct pt to d/c, however daughter states she did not stop and has still been taking.  She would love to be able to take her off this, but wanted to be sure it was ok to just stop or does she need to wean off.

## 2012-06-17 NOTE — Telephone Encounter (Signed)
i would do 1 bid for a week, then 1 qd for a week and then stop.  Let me know if not doing well after d/c.  I think that it is about time for her f/u.

## 2012-06-17 NOTE — Telephone Encounter (Signed)
Left msg for daughter at work and cell voice mail of Dr. Don Perking instructions.

## 2012-06-27 ENCOUNTER — Other Ambulatory Visit: Payer: Self-pay | Admitting: General Practice

## 2012-06-27 MED ORDER — AMLODIPINE BESYLATE 5 MG PO TABS: 5.0000 mg | ORAL_TABLET | Freq: Every day | ORAL | Status: DC

## 2012-06-27 NOTE — Telephone Encounter (Signed)
Med filled.  

## 2012-07-19 ENCOUNTER — Other Ambulatory Visit (INDEPENDENT_AMBULATORY_CARE_PROVIDER_SITE_OTHER): Payer: Medicare Other

## 2012-07-19 DIAGNOSIS — E876 Hypokalemia: Secondary | ICD-10-CM | POA: Diagnosis not present

## 2012-07-19 DIAGNOSIS — E0590 Thyrotoxicosis, unspecified without thyrotoxic crisis or storm: Secondary | ICD-10-CM

## 2012-07-19 LAB — TSH: TSH: 0.49 u[IU]/mL (ref 0.35–5.50)

## 2012-07-19 LAB — BASIC METABOLIC PANEL
CO2: 30 mEq/L (ref 19–32)
Chloride: 103 mEq/L (ref 96–112)
Glucose, Bld: 99 mg/dL (ref 70–99)
Sodium: 141 mEq/L (ref 135–145)

## 2012-08-01 ENCOUNTER — Other Ambulatory Visit: Payer: Self-pay

## 2012-08-01 MED ORDER — ESCITALOPRAM OXALATE 20 MG PO TABS: 20.0000 mg | ORAL_TABLET | Freq: Every evening | ORAL | Status: DC

## 2012-08-09 ENCOUNTER — Other Ambulatory Visit: Payer: Self-pay | Admitting: Family Medicine

## 2012-08-19 ENCOUNTER — Other Ambulatory Visit: Payer: Self-pay | Admitting: Family Medicine

## 2012-08-22 ENCOUNTER — Other Ambulatory Visit: Payer: Self-pay | Admitting: *Deleted

## 2012-08-22 DIAGNOSIS — J309 Allergic rhinitis, unspecified: Secondary | ICD-10-CM

## 2012-08-22 MED ORDER — MOMETASONE FUROATE 50 MCG/ACT NA SUSP: 2.0000 | Freq: Every day | NASAL | Status: AC | PRN

## 2012-08-24 ENCOUNTER — Other Ambulatory Visit: Payer: Self-pay | Admitting: Family Medicine

## 2012-08-30 ENCOUNTER — Other Ambulatory Visit: Payer: Self-pay | Admitting: Family Medicine

## 2012-09-01 ENCOUNTER — Telehealth: Payer: Self-pay

## 2012-09-01 NOTE — Telephone Encounter (Signed)
Patient called requesting labs, not reviewed so I did not give her information. She states that she is still running a fever of 100.8 today. Best# Please advise instructions.

## 2012-09-05 ENCOUNTER — Telehealth: Payer: Self-pay

## 2012-09-14 ENCOUNTER — Encounter: Payer: Self-pay | Admitting: Internal Medicine

## 2012-09-14 ENCOUNTER — Ambulatory Visit (INDEPENDENT_AMBULATORY_CARE_PROVIDER_SITE_OTHER): Payer: Medicare Other | Admitting: Internal Medicine

## 2012-09-14 ENCOUNTER — Other Ambulatory Visit: Payer: Self-pay | Admitting: Family Medicine

## 2012-09-14 VITALS — BP 110/68 | HR 87 | Temp 98.5°F | Wt 186.0 lb

## 2012-09-14 DIAGNOSIS — K1370 Unspecified lesions of oral mucosa: Secondary | ICD-10-CM

## 2012-09-14 DIAGNOSIS — G4733 Obstructive sleep apnea (adult) (pediatric): Secondary | ICD-10-CM | POA: Diagnosis not present

## 2012-09-14 DIAGNOSIS — G2581 Restless legs syndrome: Secondary | ICD-10-CM | POA: Diagnosis not present

## 2012-09-14 DIAGNOSIS — R413 Other amnesia: Secondary | ICD-10-CM | POA: Diagnosis not present

## 2012-09-14 NOTE — Patient Instructions (Addendum)
To prevent sleep dysfunction follow these instructions for sleep hygiene. Do not read, watch TV, or eat in bed. Do not get into bed until you are ready to turn off the light &  to go to sleep. Do not ingest stimulants ( decongestants, diet pills, nicotine, caffeine) after the evening meal.Do not take daytime naps.Cardiovascular exercise, this can be as simple a program as walking, is recommended 30-45 minutes 3-4 times per week. If you're not exercising you should take 6-8 weeks to build up to this level.  Please review the medication list in the After Visit Summary provided.Please verify the medication name (this may be  brand or generic) & correct dosage. Write the name of the prescribing physician to the right of the medication and share this with all medical staff seen at each appointment. This will help provide continuity of care; help optimize therapeutic interventions;and help prevent drug:drug adverse reaction.

## 2012-09-14 NOTE — Progress Notes (Signed)
Subjective:    Patient ID: [name], female    DOB: 1941-02-04, 72 y.o.   MRN: [id number]  HPI  She has multiple concerns. She's had a lesion over her lower lip for 1-2 months. Initially it was a blister which she attributed to biting her lip. It became a knot within past 2 weeks; has been stable since.  She has chronic sleep issues. She was diagnosed as having obstructive sleep apnea by Dr Craige Cotta who prescribed CPAP. Despite using CPAP she states she has difficulty going to sleep and often is unable to sleep until early in the morning. When this occurs she will sleep until 12 noon. Otherwise there is no early awakening. She states that her machine broke 2 days ago and she's been unable to reach her sleep specialists.  She also describes restless leg symptoms particularly at night ; she states she has difficulty keeping them still. While seated she has to continually cross & uncross her legs.  She is on generic Sinemet but denies a history of Parkinson's. She believed that this medicine may have been prescribed by Dr. Corliss Skains who she thought was a neurologist ( It is Dr Tat). The problem list also lists bipolar disorder and obsessive-compulsive disorder.  Generic clonazepam is on her med list but she is unsure of whether she is taking this. She is also unsure whether she is taking generic Zanaflex, Valtrex, or hydromorphone.  Review of Systems   There's been no associated fever, chills, sweats, weight loss with the oral lesion.  She states that she has a driver; she states her daughter "works and can not not bring me"     Objective:   Physical Exam Gen.: Healthy and well-nourished in appearance. Alert and cooperative throughout exam. Very disjointed historian.Appears younger than stated age  Head: Normocephalic without obvious abnormalities Eyes: No corneal or conjunctival inflammation noted. No lid lag or proptosis. Extraocular motion intact. No icterus Ears: External  ear exam  reveals no significant lesions or deformities. Canals clear .TMs normal. Hearing is grossly normal bilaterally. Nose: External nasal exam reveals no deformity or inflammation. Nasal mucosa are pink and moist. No lesions or exudates noted.  Mouth: Oral mucosa and oropharynx reveal benign  mucocele L lower lip. Teeth in good repair. Neck: No deformities, masses, or tenderness noted. Thyroid normal. Lungs: Normal respiratory effort; chest expands symmetrically. Lungs are clear to auscultation without rales, wheezes, or increased work of breathing. Heart: Normal rate and rhythm. Normal S1 and S2. No gallop, click, or rub. S4 with slurring; no murmur. Abdomen: Bowel sounds normal; abdomen soft and nontender. No masses, organomegaly or hernias noted.                                 Musculoskeletal/extremities: Slightly  accentuated curvature of upper thoracic  Spine.  No clubbing, cyanosis, edema, or significant extremity  deformity noted. Range of motion normal .Tone & strength  Normal. Joints normal . Nail health good. Able to lie down & sit up w/o help.  Vascular: Carotid, radial artery,  and  posterior tibial pulses are full and equal. Decreased dorsalis pedis pulses.No bruits present. Neurologic: Alert and oriented . She gave the date as 09/15/12 , not 8/6. She was able to identify the President. She names 10 animals in 50 seconds but struggled with "hoppy" meaning Kangaroo. She was able to spell the word "world" backwards and had good recall of 3 items  Deep tendon reflexes symmetrical and normal.       Skin: Intact without suspicious lesions or rashes. Lymph: No cervical, axillary lymphadenopathy present. Psych: Mood and affect are normal. Normally interactive                                                                                        Assessment & Plan:  #1 benign mucocele left lip; if this increases in size or become secondarily infected surgical resection could be pursued.  This would be best addressed by an oral surgeon  #2 sleep disruption with a diagnosis of sleep apnea. Her CPAP machine apparently is broken. She should be reevaluated by Dr.Sood  #3 restless leg syndrome; clonazepam which is listed on her medication list( but which she does not recall) would be an adequate starting point. Her med list does need to be verified.(see note above about daughter)  #4 memory disorder; this was selective as noted above. The major issue was inability to identify her present medications and prescribing physicians.

## 2012-09-15 LAB — IBC PANEL
Iron: 72 ug/dL (ref 42–145)
Saturation Ratios: 18 % — ABNORMAL LOW (ref 20.0–50.0)
Transferrin: 286 mg/dL (ref 212.0–360.0)

## 2012-09-15 NOTE — Telephone Encounter (Signed)
Last seen 05/17/12 and filled 06/07/12 #90. UDS 05/17/12 low risk. Please advise      KP

## 2012-09-26 ENCOUNTER — Telehealth: Payer: Self-pay

## 2012-09-27 ENCOUNTER — Ambulatory Visit (INDEPENDENT_AMBULATORY_CARE_PROVIDER_SITE_OTHER): Payer: Medicare Other | Admitting: Pulmonary Disease

## 2012-09-27 ENCOUNTER — Encounter: Payer: Self-pay | Admitting: Pulmonary Disease

## 2012-09-27 ENCOUNTER — Other Ambulatory Visit: Payer: Self-pay | Admitting: Pulmonary Disease

## 2012-09-27 VITALS — BP 106/60 | HR 81 | Temp 97.6°F | Ht 67.0 in | Wt 187.8 lb

## 2012-09-27 DIAGNOSIS — G4733 Obstructive sleep apnea (adult) (pediatric): Secondary | ICD-10-CM

## 2012-09-27 DIAGNOSIS — G4700 Insomnia, unspecified: Secondary | ICD-10-CM | POA: Insufficient documentation

## 2012-09-27 DIAGNOSIS — G2581 Restless legs syndrome: Secondary | ICD-10-CM | POA: Diagnosis not present

## 2012-09-27 MED ORDER — ROPINIROLE HCL 0.5 MG PO TABS: ORAL_TABLET | ORAL | Status: DC

## 2012-09-27 NOTE — Assessment & Plan Note (Signed)
The patient has a history of mild obstructive sleep apnea, but it is unclear how much this may or may not be contributing to her sleeping issues.  I would like for her to get back on CPAP, and I will get a download off her device in another few weeks to make sure that it is effective and if she's not having a lot of mask leak.

## 2012-09-27 NOTE — Assessment & Plan Note (Signed)
The patient has a long-standing history of limb movements during the night, and her history is very suggestive of restless leg syndrome.  She feels this definitely contributes to her sleep onset issues.  I would like to try her on a dopamine agonist, but will also make sure this is okay by her neurologist.

## 2012-09-27 NOTE — Progress Notes (Signed)
Subjective:    Patient ID: [name], female    DOB: 08/30/1940, 72 y.o.   MRN: [id number]  HPI The patient is a 72 year old female asked to see for management of obstructive sleep apnea and other sleeping issues.  The patient has a history of mild OSA, diagnosed in 2011, for which she has been started on CPAP.  She wore her CPAP initially, but did not see that it helped her sleep that significantly.  Most recently, she has had issues with her CPAP, and just received her device back after being worked on.  She is not started using her CPAP again.  The patient currently uses a nasal mass, and is unsure if she has an issue with mouth opening she is a long-standing issue with sleep onset problems, and this continues to be the case.  Even if she is able to sleep, the patient states that she continues to feel sleepy upon arising.  However, she states that her efforts score today is zero.  Her sleep study in 2011 also showed very large numbers of limb movements with significant sleep disruption.  She notes an abnormal sensation in her legs at night, to the point that "I have to move them".  This interferes with her sleep onset according to the patient.  Complicating all of this is significant underlying psychiatric disease, and the question has also been raised whether she may have medication induced tardive dyskinesia.   Sleep Questionnaire What time do you typically go to bed?( Between what hours) btw 2-4am or later btw 2-4am or later at 1430 on 09/27/12 by Maisie Fus, CMA How long does it take you to fall asleep? several hours several hours at 1430 on 09/27/12 by Maisie Fus, CMA How many times during the night do you wake up? 3 3 at 1430 on 09/27/12 by Maisie Fus, CMA What time do you get out of bed to start your day? 1200 1200 at 1430 on 09/27/12 by Maisie Fus, CMA Do you drive or operate heavy machinery in your occupation? No No at 1430 on 09/27/12 by Maisie Fus, CMA  How much has your weight changed (up or down) over the past two years? (In pounds) 36 lb (16.329 kg)36 lb (16.329 kg) decrease at 1430 on 09/27/12 by Maisie Fus, CMA Have you ever had a sleep study before? Yes Yes at 1430 on 09/27/12 by Maisie Fus, CMA If yes, [facility]30 on 09/27/12 by Maisie Fus, CMA If yes, date of study? 2008? 2008? at 1430 on 09/27/12 by Maisie Fus, CMA Do you currently use CPAP? Yes Yes at 1430 on 09/27/12 by Maisie Fus, CMA If so, what pressure? unsure?  unsure?  at 1430 on 09/27/12 by Maisie Fus, CMA Do you wear oxygen at any time? No No at 1430 on 09/27/12 by Maisie Fus, CMA   Review of Systems  Constitutional: Negative for fever and unexpected weight change.  HENT: Negative for ear pain, nosebleeds, congestion, sore throat, rhinorrhea, sneezing, trouble swallowing, dental problem, postnasal drip and sinus pressure.        Allergies  Eyes: Negative for redness and itching.  Respiratory: Positive for cough. Negative for chest tightness, shortness of breath and wheezing.   Cardiovascular: Negative for palpitations and leg swelling.  Gastrointestinal: Negative for nausea and vomiting.  Genitourinary: Negative for dysuria.  Musculoskeletal: Negative for joint swelling.  Skin: Negative for rash.  Neurological:  Negative for headaches.  Hematological: Does not bruise/bleed easily.  Psychiatric/Behavioral: Negative for dysphoric mood. The patient is not nervous/anxious.        Objective:   Physical Exam Constitutional:  Well developed, no acute distress  HENT:  Nares patent without discharge  Oropharynx without exudate, palate and uvula are normal  Eyes:  Perrla, eomi, no scleral icterus  Neck:  No JVD, no TMG  Cardiovascular:  Normal rate, regular rhythm, no rubs or gallops.  No murmurs        Intact distal pulses  Pulmonary :  Normal breath sounds, no stridor or respiratory distress   No  rales, rhonchi, or wheezing  Abdominal:  Soft, nondistended, bowel sounds present.  No tenderness noted.   Musculoskeletal:  minimal lower extremity edema noted, +varicosities.  Lymph Nodes:  No cervical lymphadenopathy noted  Skin:  No cyanosis noted  Neurologic:  Alert, appropriate, moves all 4 extremities without obvious deficit.         Assessment & Plan:

## 2012-09-27 NOTE — Assessment & Plan Note (Signed)
This has been a long-standing problem for her, and is multifactorial, including her underlying psychiatric disease.  I really do not think sleeping medications have a role here, and I would highly recommend cognitive behavioral therapy by her psychiatrist.  Hopefully treating her underlying sleep disorders will help as well.

## 2012-09-27 NOTE — Patient Instructions (Addendum)
Restart cpap at current pressure, and I will have your home care company get a download in a few weeks so we can see how things are going. Will try you on requip for restless leg syndrome, and I will send a note to Dr. Arbutus Leas making sure she is ok with this. If you continue to have sleep onset issues, would recommend cognitive behavioral therapy with your psychiatrist. Please call me in 3 weeks with your response to cpap and the medication for your leg symptoms.

## 2012-09-28 ENCOUNTER — Other Ambulatory Visit: Payer: Self-pay | Admitting: Family Medicine

## 2012-10-03 ENCOUNTER — Encounter: Payer: Self-pay | Admitting: Neurology

## 2012-10-03 ENCOUNTER — Ambulatory Visit (INDEPENDENT_AMBULATORY_CARE_PROVIDER_SITE_OTHER): Payer: Medicare Other | Admitting: Neurology

## 2012-10-03 ENCOUNTER — Telehealth: Payer: Self-pay | Admitting: Pulmonary Disease

## 2012-10-03 VITALS — BP 104/64 | HR 76 | Temp 97.6°F | Resp 18 | Wt 186.0 lb

## 2012-10-03 DIAGNOSIS — G2401 Drug induced subacute dyskinesia: Secondary | ICD-10-CM

## 2012-10-03 DIAGNOSIS — T43591A Poisoning by other antipsychotics and neuroleptics, accidental (unintentional), initial encounter: Secondary | ICD-10-CM

## 2012-10-03 DIAGNOSIS — G4700 Insomnia, unspecified: Secondary | ICD-10-CM | POA: Diagnosis not present

## 2012-10-03 NOTE — Telephone Encounter (Signed)
Spoke with pt-- Pt to contact psychiatrist in regards to her sleep issues to receive guidance of behavioral therapies.
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
## 2012-10-03 NOTE — Telephone Encounter (Signed)
Pt still having issues with getting to sleep.  If her legs are better, but not fully controlled, she can increase the requip dose as we discussed at the visit. If she doesn't think the legs are an issue, then would recommend cognitive behavioral therapy with her psychiatrist.  Sleeping medication is never the answer with insomnia.  She needs to learn how to "turn off her brain", and the behavioral therapy can help with this.

## 2012-10-06 ENCOUNTER — Telehealth: Payer: Self-pay

## 2012-10-06 NOTE — Telephone Encounter (Signed)
Please tell pt to d/c levodopa as she doesn't need it any longer. Think that she is only on one pill daily (if at all). We told her to d/c it on phone in may but she didn't know her meds today so wanted to make sure it was clear that we wanted her off of it    Pt aware. Vesta Mixer, CMA

## 2012-10-13 DIAGNOSIS — H01009 Unspecified blepharitis unspecified eye, unspecified eyelid: Secondary | ICD-10-CM | POA: Diagnosis not present

## 2012-10-17 ENCOUNTER — Telehealth: Payer: Self-pay | Admitting: Pulmonary Disease

## 2012-10-17 DIAGNOSIS — G4733 Obstructive sleep apnea (adult) (pediatric): Secondary | ICD-10-CM

## 2012-10-17 NOTE — Telephone Encounter (Signed)
Got it.  Need download in a few weeks now that she has gotten new supplies.

## 2012-10-17 NOTE — Telephone Encounter (Signed)
I have sent order to [facility] and staff message.

## 2012-10-17 NOTE — Telephone Encounter (Signed)
Spoke with Melissa with [facility] She would like Dr. Shelle Iron to be aware that they have been unable to obtain a download from patient because patient has not been wearing CPAP machine. Per Melissa, patient states the mask hurts her to wear. Patient and her caregiver went to [facility] office and they stated the machine had been blowing water into patients face when she tried to wear it. Melissa says the patient and caregiver were given instructions on how to use the humidity chamber and machine. The patients humidity level was also decreased from 5.0 to 3.5. Per Efraim Kaufmann, they saw patient had not received any new supplies since 2012 so they provided patient with a new mask,tubing, filters and humidity chamber. Will forward to Dr. Shelle Iron so that he is aware.

## 2012-10-25 ENCOUNTER — Telehealth: Payer: Self-pay | Admitting: Pulmonary Disease

## 2012-10-25 ENCOUNTER — Other Ambulatory Visit: Payer: Self-pay | Admitting: Family Medicine

## 2012-10-25 DIAGNOSIS — G4733 Obstructive sleep apnea (adult) (pediatric): Secondary | ICD-10-CM

## 2012-10-25 NOTE — Telephone Encounter (Signed)
Let her know it is not the machine that creates the issue with her face discomfort but her mask She can be referred to the sleep center for a mask fitting.   Thanks.

## 2012-10-25 NOTE — Telephone Encounter (Signed)
Called and spoke with pt and she is aware of [facility] recs and wants the appt at the sleep center for the mask fitting.  Order has been placed and pt is aware that we will call her with this appt.

## 2012-10-25 NOTE — Telephone Encounter (Signed)
Spoke to pt. States that she has not used her CPAP machine in about a month. [facility] supposedly rebuilt her machine and it now hurts the bones in her face. Pt states that she told [facility] about this on 09/28/12 and [facility] told her that they would contact us about this. I see in her chart where Melissa from [facility] contacted [place] (10/17/12) and let us know that they had helped her with this issue. According to [facility] the pt was given all new supplies including a mask.  Pt states that she still is unable to use the machine because it hurts her face.  KC - please advise. Thanks.

## 2012-10-27 DIAGNOSIS — Z23 Encounter for immunization: Secondary | ICD-10-CM | POA: Diagnosis not present

## 2012-10-27 NOTE — Telephone Encounter (Signed)
Pt requesting refill of medication given at 09/27/12 visit.  Okay to leave a this dose? Requip .5mg  Take 1 tablet nightly after dinner. Can increase to 2 tablets after dinner in one week if still having symptoms.  #50  Please advise Dr Shelle Iron. Thanks.

## 2012-10-28 NOTE — Telephone Encounter (Signed)
Ok to stay on requip at whatever dose helped her.

## 2012-10-28 NOTE — Telephone Encounter (Signed)
Requip .5mg  -- Take 1 tablet nightly after dinner. Can increase to 2 tablets after dinner in one week if still having symptoms. #50 Sent [facility]

## 2012-11-01 ENCOUNTER — Other Ambulatory Visit (HOSPITAL_BASED_OUTPATIENT_CLINIC_OR_DEPARTMENT_OTHER): Payer: Medicare Other

## 2012-11-09 ENCOUNTER — Ambulatory Visit (HOSPITAL_BASED_OUTPATIENT_CLINIC_OR_DEPARTMENT_OTHER): Payer: Medicare Other | Attending: Pulmonary Disease | Admitting: Radiology

## 2012-11-09 DIAGNOSIS — G4733 Obstructive sleep apnea (adult) (pediatric): Secondary | ICD-10-CM

## 2012-11-09 DIAGNOSIS — Z9989 Dependence on other enabling machines and devices: Secondary | ICD-10-CM

## 2012-11-12 ENCOUNTER — Other Ambulatory Visit: Payer: Self-pay | Admitting: Family Medicine

## 2012-11-14 ENCOUNTER — Telehealth: Payer: Self-pay | Admitting: *Deleted

## 2012-11-14 NOTE — Telephone Encounter (Signed)
CPAP/BiPAP mask fitting desensitization done at sleep center. Vernon faxed over results. In green folder

## 2012-11-15 DIAGNOSIS — Z79899 Other long term (current) drug therapy: Secondary | ICD-10-CM | POA: Diagnosis not present

## 2012-11-15 DIAGNOSIS — E559 Vitamin D deficiency, unspecified: Secondary | ICD-10-CM | POA: Diagnosis not present

## 2012-11-15 DIAGNOSIS — M1710 Unilateral primary osteoarthritis, unspecified knee: Secondary | ICD-10-CM | POA: Diagnosis not present

## 2012-11-15 DIAGNOSIS — M76899 Other specified enthesopathies of unspecified lower limb, excluding foot: Secondary | ICD-10-CM | POA: Diagnosis not present

## 2012-11-15 DIAGNOSIS — M19049 Primary osteoarthritis, unspecified hand: Secondary | ICD-10-CM | POA: Diagnosis not present

## 2012-11-15 DIAGNOSIS — M1A00X0 Idiopathic chronic gout, unspecified site, without tophus (tophi): Secondary | ICD-10-CM | POA: Diagnosis not present

## 2012-11-15 DIAGNOSIS — M2550 Pain in unspecified joint: Secondary | ICD-10-CM | POA: Diagnosis not present

## 2012-11-16 ENCOUNTER — Telehealth: Payer: Self-pay | Admitting: Pulmonary Disease

## 2012-11-16 DIAGNOSIS — G4733 Obstructive sleep apnea (adult) (pediatric): Secondary | ICD-10-CM

## 2012-11-16 DIAGNOSIS — H01009 Unspecified blepharitis unspecified eye, unspecified eyelid: Secondary | ICD-10-CM | POA: Diagnosis not present

## 2012-11-16 NOTE — Telephone Encounter (Signed)
Ok with me 

## 2012-11-16 NOTE — Telephone Encounter (Signed)
Vernon from sleep center returned call. Leanora Ivanoff

## 2012-11-16 NOTE — Telephone Encounter (Signed)
Order sent and a staff message to [facility]

## 2012-11-16 NOTE — Telephone Encounter (Signed)
I spoke with daughter. She stated pt went to last week to see vernon. He was suppose to fax over the mask type she needed so we could get this sent to [facility]. I advised will call vernon to see what he recommended. I called the sleep center and had to [facility] as the phones were going straight to [facility]

## 2012-11-16 NOTE — Telephone Encounter (Signed)
I spoke with [name]. He reports he used resmed airfit P10 small 5 pillows. Please advise KC if okay to send order for this thanks

## 2012-11-17 ENCOUNTER — Other Ambulatory Visit: Payer: Self-pay | Admitting: Family Medicine

## 2012-11-18 ENCOUNTER — Telehealth: Payer: Self-pay | Admitting: Pulmonary Disease

## 2012-11-18 NOTE — Telephone Encounter (Signed)
Will forward to KC as FYI 

## 2012-11-18 NOTE — Telephone Encounter (Signed)
Not good enough.  Not wearing cpap because can't tolerate mask.  We need to change to whatever it takes for her to wear the device.  If they won't do it, send message to [facility] to change dme providers.

## 2012-11-18 NOTE — Telephone Encounter (Signed)
Please help Almyra Free!

## 2012-11-21 ENCOUNTER — Other Ambulatory Visit: Payer: Self-pay | Admitting: Family Medicine

## 2012-11-22 ENCOUNTER — Telehealth: Payer: Self-pay | Admitting: Pulmonary Disease

## 2012-11-22 NOTE — Telephone Encounter (Signed)
I spoke with Almyra Free and she states she spoke with Education officer, environmental at [facility] and they are going to get a mask from the mask rep and give this to the pt so insurance does not have to be billed, or the pt.  I spoke with pt daughter and advised. Carron Curie, CMA

## 2012-11-22 NOTE — Telephone Encounter (Signed)
Software engineer @[facility]  has taken care of this issue to get this pt a mask [name]

## 2012-12-04 ENCOUNTER — Other Ambulatory Visit: Payer: Self-pay | Admitting: Family Medicine

## 2012-12-07 DIAGNOSIS — H01009 Unspecified blepharitis unspecified eye, unspecified eyelid: Secondary | ICD-10-CM | POA: Diagnosis not present

## 2012-12-15 ENCOUNTER — Other Ambulatory Visit: Payer: Self-pay | Admitting: Family Medicine

## 2012-12-15 NOTE — Telephone Encounter (Signed)
Last seen 05/17/12 and filled 09/14/12 #90. Please advise     KP

## 2012-12-22 ENCOUNTER — Other Ambulatory Visit: Payer: Self-pay | Admitting: Family Medicine

## 2012-12-22 NOTE — Telephone Encounter (Signed)
meds filled

## 2012-12-26 DIAGNOSIS — E118 Type 2 diabetes mellitus with unspecified complications: Secondary | ICD-10-CM | POA: Diagnosis not present

## 2012-12-31 ENCOUNTER — Other Ambulatory Visit: Payer: Self-pay | Admitting: Family Medicine

## 2013-01-07 ENCOUNTER — Other Ambulatory Visit: Payer: Self-pay | Admitting: Family Medicine

## 2013-01-18 DIAGNOSIS — H01009 Unspecified blepharitis unspecified eye, unspecified eyelid: Secondary | ICD-10-CM | POA: Diagnosis not present

## 2013-02-01 ENCOUNTER — Other Ambulatory Visit: Payer: Self-pay | Admitting: Family Medicine

## 2013-02-01 NOTE — Telephone Encounter (Signed)
Atorvastatin refilled per protocol. JG//CMA 

## 2013-02-15 ENCOUNTER — Other Ambulatory Visit: Payer: Self-pay | Admitting: Family Medicine

## 2013-02-24 ENCOUNTER — Emergency Department (HOSPITAL_COMMUNITY): Payer: Medicare Other

## 2013-02-24 ENCOUNTER — Encounter (HOSPITAL_COMMUNITY): Payer: Self-pay | Admitting: Emergency Medicine

## 2013-02-24 ENCOUNTER — Emergency Department (HOSPITAL_COMMUNITY)
Admission: EM | Admit: 2013-02-24 | Discharge: 2013-02-24 | Disposition: A | Discharge status: Home / Self Care | Payer: Medicare Other | Attending: Emergency Medicine | Admitting: Emergency Medicine

## 2013-02-24 DIAGNOSIS — G2581 Restless legs syndrome: Secondary | ICD-10-CM | POA: Insufficient documentation

## 2013-02-24 DIAGNOSIS — R1031 Right lower quadrant pain: Secondary | ICD-10-CM | POA: Diagnosis not present

## 2013-02-24 DIAGNOSIS — Z87440 Personal history of urinary (tract) infections: Secondary | ICD-10-CM | POA: Insufficient documentation

## 2013-02-24 DIAGNOSIS — E119 Type 2 diabetes mellitus without complications: Secondary | ICD-10-CM | POA: Diagnosis not present

## 2013-02-24 DIAGNOSIS — E785 Hyperlipidemia, unspecified: Secondary | ICD-10-CM | POA: Insufficient documentation

## 2013-02-24 DIAGNOSIS — K5900 Constipation, unspecified: Secondary | ICD-10-CM | POA: Diagnosis not present

## 2013-02-24 DIAGNOSIS — F411 Generalized anxiety disorder: Secondary | ICD-10-CM | POA: Insufficient documentation

## 2013-02-24 DIAGNOSIS — R109 Unspecified abdominal pain: Secondary | ICD-10-CM | POA: Diagnosis not present

## 2013-02-24 DIAGNOSIS — Z860100 Personal history of colon polyps, unspecified: Secondary | ICD-10-CM | POA: Insufficient documentation

## 2013-02-24 DIAGNOSIS — Z87828 Personal history of other (healed) physical injury and trauma: Secondary | ICD-10-CM | POA: Diagnosis not present

## 2013-02-24 DIAGNOSIS — Z791 Long term (current) use of non-steroidal anti-inflammatories (NSAID): Secondary | ICD-10-CM | POA: Insufficient documentation

## 2013-02-24 DIAGNOSIS — K219 Gastro-esophageal reflux disease without esophagitis: Secondary | ICD-10-CM | POA: Insufficient documentation

## 2013-02-24 DIAGNOSIS — Z8619 Personal history of other infectious and parasitic diseases: Secondary | ICD-10-CM | POA: Insufficient documentation

## 2013-02-24 DIAGNOSIS — Z79899 Other long term (current) drug therapy: Secondary | ICD-10-CM | POA: Insufficient documentation

## 2013-02-24 DIAGNOSIS — Z7982 Long term (current) use of aspirin: Secondary | ICD-10-CM | POA: Insufficient documentation

## 2013-02-24 DIAGNOSIS — M810 Age-related osteoporosis without current pathological fracture: Secondary | ICD-10-CM | POA: Insufficient documentation

## 2013-02-24 DIAGNOSIS — R10813 Right lower quadrant abdominal tenderness: Secondary | ICD-10-CM

## 2013-02-24 DIAGNOSIS — I10 Essential (primary) hypertension: Secondary | ICD-10-CM | POA: Insufficient documentation

## 2013-02-24 DIAGNOSIS — Z87891 Personal history of nicotine dependence: Secondary | ICD-10-CM | POA: Insufficient documentation

## 2013-02-24 DIAGNOSIS — G4733 Obstructive sleep apnea (adult) (pediatric): Secondary | ICD-10-CM | POA: Diagnosis not present

## 2013-02-24 DIAGNOSIS — M159 Polyosteoarthritis, unspecified: Secondary | ICD-10-CM | POA: Insufficient documentation

## 2013-02-24 DIAGNOSIS — M109 Gout, unspecified: Secondary | ICD-10-CM | POA: Diagnosis not present

## 2013-02-24 DIAGNOSIS — Z872 Personal history of diseases of the skin and subcutaneous tissue: Secondary | ICD-10-CM | POA: Diagnosis not present

## 2013-02-24 DIAGNOSIS — R10819 Abdominal tenderness, unspecified site: Secondary | ICD-10-CM | POA: Diagnosis not present

## 2013-02-24 DIAGNOSIS — E039 Hypothyroidism, unspecified: Secondary | ICD-10-CM | POA: Diagnosis not present

## 2013-02-24 DIAGNOSIS — K2970 Gastritis, unspecified, without bleeding: Secondary | ICD-10-CM | POA: Diagnosis not present

## 2013-02-24 LAB — URINALYSIS, ROUTINE W REFLEX MICROSCOPIC
BILIRUBIN URINE: NEGATIVE
Glucose, UA: NEGATIVE mg/dL
Hgb urine dipstick: NEGATIVE
KETONES UR: NEGATIVE mg/dL
Leukocytes, UA: NEGATIVE
NITRITE: NEGATIVE
Protein, ur: NEGATIVE mg/dL
SPECIFIC GRAVITY, URINE: 1.009 (ref 1.005–1.030)
UROBILINOGEN UA: 0.2 mg/dL (ref 0.0–1.0)
pH: 6.5 (ref 5.0–8.0)

## 2013-02-24 LAB — CBC WITH DIFFERENTIAL/PLATELET
BASOS ABS: 0 10*3/uL (ref 0.0–0.1)
Basophils Relative: 0 % (ref 0–1)
EOS ABS: 0.3 10*3/uL (ref 0.0–0.7)
EOS PCT: 3 % (ref 0–5)
HCT: 36.6 % (ref 36.0–46.0)
Hemoglobin: 12.7 g/dL (ref 12.0–15.0)
LYMPHS PCT: 27 % (ref 12–46)
Lymphs Abs: 2.3 10*3/uL (ref 0.7–4.0)
MCH: 31.8 pg (ref 26.0–34.0)
MCHC: 34.7 g/dL (ref 30.0–36.0)
MCV: 91.7 fL (ref 78.0–100.0)
Monocytes Absolute: 1.1 10*3/uL — ABNORMAL HIGH (ref 0.1–1.0)
Monocytes Relative: 13 % — ABNORMAL HIGH (ref 3–12)
Neutro Abs: 5 10*3/uL (ref 1.7–7.7)
Neutrophils Relative %: 58 % (ref 43–77)
Platelets: 269 10*3/uL (ref 150–400)
RBC: 3.99 MIL/uL (ref 3.87–5.11)
RDW: 12.8 % (ref 11.5–15.5)
WBC: 8.7 10*3/uL (ref 4.0–10.5)

## 2013-02-24 LAB — COMPREHENSIVE METABOLIC PANEL
ALBUMIN: 3.5 g/dL (ref 3.5–5.2)
ALK PHOS: 79 U/L (ref 39–117)
ALT: 11 U/L (ref 0–35)
AST: 13 U/L (ref 0–37)
BUN: 28 mg/dL — AB (ref 6–23)
CHLORIDE: 102 meq/L (ref 96–112)
CO2: 27 mEq/L (ref 19–32)
Calcium: 8.6 mg/dL (ref 8.4–10.5)
Creatinine, Ser: 1.27 mg/dL — ABNORMAL HIGH (ref 0.50–1.10)
GFR calc Af Amer: 47 mL/min — ABNORMAL LOW (ref >90)
GFR calc non Af Amer: 41 mL/min — ABNORMAL LOW (ref >90)
GLUCOSE: 116 mg/dL — AB (ref 70–99)
Potassium: 4.1 mEq/L (ref 3.7–5.3)
Sodium: 142 mEq/L (ref 137–147)
Total Bilirubin: 0.3 mg/dL (ref 0.3–1.2)
Total Protein: 6.4 g/dL (ref 6.0–8.3)

## 2013-02-24 LAB — LIPASE, BLOOD: Lipase: 30 U/L (ref 11–59)

## 2013-02-24 MED ORDER — IOHEXOL 300 MG/ML  SOLN
25.0000 mL | INTRAMUSCULAR | Status: AC
  Administered 2013-02-24: 25 mL via ORAL

## 2013-02-24 MED ORDER — IOHEXOL 300 MG/ML  SOLN
100.0000 mL | Freq: Once | INTRAMUSCULAR | Status: AC | PRN
  Administered 2013-02-24: 100 mL via INTRAVENOUS

## 2013-02-24 NOTE — ED Notes (Signed)
Presents with 3 days of abdominal with radiation to right flank. pai is made worse with movement and tight fitting clothing, denies urinary symptoms, dnies SOB, denies nausea, vomiting and diarrhea. Normal bowel movements.

## 2013-02-24 NOTE — Discharge Instructions (Signed)
Try miralax for your constipation.  If the pain worsens return to the ED.  Abdominal Pain, Adult Many things can cause abdominal pain. Usually, abdominal pain is not caused by a disease and will improve without treatment. It can often be observed and treated at home. Your health care provider will do a physical exam and possibly order blood tests and X-rays to help determine the seriousness of your pain. However, in many cases, more time must pass before a clear cause of the pain can be found. Before that point, your health care provider may not know if you need more testing or further treatment. HOME CARE INSTRUCTIONS  Monitor your abdominal pain for any changes. The following actions may help to alleviate any discomfort you are experiencing:  Only take over-the-counter or prescription medicines as directed by your health care provider.  Do not take laxatives unless directed to do so by your health care provider.  Try a clear liquid diet (broth, tea, or water) as directed by your health care provider. Slowly move to a bland diet as tolerated. SEEK MEDICAL CARE IF:  You have unexplained abdominal pain.  You have abdominal pain associated with nausea or diarrhea.  You have pain when you urinate or have a bowel movement.  You experience abdominal pain that wakes you in the night.  You have abdominal pain that is worsened or improved by eating food.  You have abdominal pain that is worsened with eating fatty foods. SEEK IMMEDIATE MEDICAL CARE IF:   Your pain does not go away within 2 hours.  You have a fever.  You keep throwing up (vomiting).  Your pain is felt only in portions of the abdomen, such as the right side or the left lower portion of the abdomen.  You pass bloody or black tarry stools. MAKE SURE YOU:  Understand these instructions.   Will watch your condition.   Will get help right away if you are not doing well or get worse.  Document Released: 11/05/2004  Document Revised: 11/16/2012 Document Reviewed: 10/05/2012 [facility] Patient Information 2014 [place].

## 2013-02-24 NOTE — ED Provider Notes (Signed)
CSN: [id number]     Arrival date & time 02/24/13  1817 History   First MD Initiated Contact with Patient 02/24/13 1845     Chief Complaint  Patient presents with  . Abdominal Pain   (Consider location/radiation/quality/duration/timing/severity/associated sxs/prior Treatment) Patient is a 73 y.o. female presenting with abdominal pain. The history is provided by the patient.  Abdominal Pain Pain location:  RLQ Pain quality: cramping, sharp and shooting   Pain radiates to:  Does not radiate Pain severity:  Moderate Onset quality:  Gradual Duration:  3 days Timing:  Constant Progression:  Unchanged Chronicity:  New Context: not previous surgeries, not recent illness, not sick contacts and not trauma   Relieved by:  Nothing Worsened by:  Nothing tried Ineffective treatments:  None tried Associated symptoms: no chest pain, no chills, no dysuria, no fever, no nausea, no shortness of breath and no vomiting     Past Medical History  Diagnosis Date  . Genital herpes, unspecified   . Abdominal pain, unspecified site   . Other specified intestinal malabsorption   . Esophageal reflux   . Obstructive sleep apnea (adult) (pediatric)      - PSG 09/05/09 AHI 14.2  . Memory loss   . Backache, unspecified   . Benign neoplasm of skin, site unspecified   . Tachycardia, unspecified   . Altered mental status   . Sinus tachycardia   . Anxiety   . Circadian rhythm sleep disorder, delayed sleep phase type   . Restless legs syndrome (RLS)   . Insomnia, unspecified   . Urinary tract infection, site not specified   . Concussion with no loss of consciousness   . Generalized osteoarthrosis, unspecified site   . Duodenitis without mention of hemorrhage   . Diaphragmatic hernia without mention of obstruction or gangrene   . Personal history of colonic polyps   . Diverticulosis of colon (without mention of hemorrhage)   . Diarrhea   . Allergic rhinitis due to pollen   . Viral meningitis   .  Delirium   . DM II (diabetes mellitus, type II), controlled   . Other and unspecified hyperlipidemia   . Mole (skin)   . 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 Osteoporosis, unspecified   . Shingles   . Routine general medical examination at a health care facility   . Impacted cerumen   . Unspecified fall   . Other abnormal blood chemistry   . Family history of diabetes mellitus   . Family history of psychiatric condition   . Shortness of breath   . Bilateral leg edema   . Obsessive-compulsive disorders   . Unspecified hypothyroidism   . Unspecified essential hypertension   . Gout, unspecified    Past Surgical History  Procedure Laterality Date  . Carpal tunnel release  2003-06-05    bilateral  . Nasal sinus surgery  06/05/98  . Colonoscopy  1991, 3.17.2008  . Laminectomy  11.15.07    L3-L4  . Cataract surgery both eyes  06/05/11   Family History  Problem Relation Age of Onset  . Alzheimer's disease Mother 7    died 06/05/06  . Mental illness Mother     dementia  . Depression Other   . Diabetes Other   . Cancer Other     breast, colon, kidney   History  Substance Use Topics  . Smoking status: Former Smoker -- 1.00 packs/day for 4 years    Types: Cigarettes  Quit date: 04/16/1970  . Smokeless tobacco: Never Used  . Alcohol Use: No   OB History   Grav Para Term Preterm Abortions TAB SAB Ect Mult Living                 Review of Systems  Constitutional: Negative for fever and chills.  HENT: Negative for congestion and rhinorrhea.   Eyes: Negative for redness and visual disturbance.  Respiratory: Negative for shortness of breath and wheezing.   Cardiovascular: Negative for chest pain and palpitations.  Gastrointestinal: Positive for abdominal pain. Negative for nausea and vomiting.  Genitourinary: Negative for dysuria and urgency.  Musculoskeletal: Negative for arthralgias and myalgias.  Skin: Negative for pallor and wound.  Neurological: Negative for  dizziness and headaches.    Allergies  Codeine  Home Medications   Current Outpatient Rx  Name  Route  Sig  Dispense  Refill  . alendronate (FOSAMAX) 70 MG tablet   Oral   Take 1 tablet (70 mg total) by mouth every 7 (seven) days. Takes on Fri   4 tablet   12   . amLODipine (NORVASC) 5 MG tablet      TAKE 1 TABLET BY MOUTH EVERY DAY   30 tablet   5   . Artificial Saliva (BIOTENE MOISTURIZING MOUTH) SOLN   Mouth/Throat   Use as directed in the mouth or throat daily. Swish and spit. Exact amount taken unknown.         Marland Kitchen aspirin EC 81 MG tablet   Oral   Take 81 mg by mouth every morning.          Marland Kitchen atorvastatin (LIPITOR) 20 MG tablet      TAKE 1 TABLET IN THE EVENING   30 tablet   2   . carbidopa-levodopa (SINEMET IR) 25-100 MG per tablet   Oral   Take 1 tablet by mouth every morning.         . CELEBREX 200 MG capsule      TAKE ONE CAPSULE BY MOUTH EVERY DAY   30 capsule   5   . clonazePAM (KLONOPIN) 0.5 MG tablet      TAKE 1 TABLET 3 TIMES A DAY AS NEEDED FOR ANXIETY   90 tablet   0   . colchicine 0.6 MG tablet   Oral   Take 0.6 mg by mouth daily as needed. For gout         . escitalopram (LEXAPRO) 20 MG tablet      TAKE 1 TABLET BY MOUTH IN THE EVENING   60 tablet   1   . fexofenadine (ALLEGRA) 180 MG tablet   Oral   Take 180 mg by mouth every evening.          . hydrochlorothiazide (HYDRODIURIL) 25 MG tablet      TAKE 1 TABLET EVERY DAY   100 tablet   0   . HYDROmorphone (DILAUDID) 4 MG tablet   Oral   Take 1 tablet (4 mg total) by mouth every 6 (six) hours as needed for pain.   30 tablet   0   . KLOR-CON M20 20 MEQ tablet      TAKE 1 TABLET BY MOUTH EVERY DAY   30 tablet   0   . levothyroxine (SYNTHROID, LEVOTHROID) 112 MCG tablet      TAKE 1 TABLET IN THE MORNING   30 tablet   11   . metFORMIN (GLUCOPHAGE) 500 MG tablet  TAKE 2 TABLETS IN THE EVENING   60 tablet   2   . mometasone (NASONEX) 50 MCG/ACT  nasal spray   Nasal   Place 2 sprays into the nose daily as needed. For nasal congestion   17 g   6   . multivitamin (THERAGRAN) per tablet   Oral   Take 1 tablet by mouth every evening.          Marland Kitchen NEXIUM 40 MG capsule      TAKE 1 CAPSULE (40 MG TOTAL) BY MOUTH DAILY BEFORE BREAKFAST.   30 capsule   5   . olmesartan (BENICAR) 20 MG tablet      1 tab by mouth daily--office visit due now   30 tablet   0   . rOPINIRole (REQUIP) 0.5 MG tablet      TAKE 1 TABLET EVERY NIGHT AFTER DINNER MAY INCREASE TO 2 TABLETS AFTER IN DINNER IN 1 WEEK   50 tablet   0   . tiZANidine (ZANAFLEX) 4 MG capsule   Oral   Take 1 capsule (4 mg total) by mouth every 6 (six) hours.   60 capsule   0   . ULORIC 40 MG tablet   Oral   Take 1 tablet by mouth every morning.          . valACYclovir (VALTREX) 1000 MG tablet   Oral   Take 1,000 mg by mouth 3 (three) times daily as needed. For Herpes flare up         . vitamin E 400 UNIT capsule   Oral   Take 400 Units by mouth daily.            BP 127/111  Pulse 80  Temp(Src) 98.7 F (37.1 C) (Oral)  Resp 16  SpO2 97% Physical Exam  Constitutional: She is oriented to person, place, and time. She appears well-developed and well-nourished. No distress.  HENT:  Head: Normocephalic and atraumatic.  Eyes: EOM are normal. Pupils are equal, round, and reactive to light.  Neck: Normal range of motion. Neck supple.  Cardiovascular: Normal rate and regular rhythm.  Exam reveals no gallop and no friction rub.   No murmur heard. Pulmonary/Chest: Effort normal. She has no wheezes. She has no rales.  Abdominal: Soft. She exhibits no distension. There is tenderness (RLQ). There is no rebound and no guarding.  Musculoskeletal: She exhibits no edema and no tenderness.  Neurological: She is alert and oriented to person, place, and time.  Skin: Skin is warm and dry. She is not diaphoretic.  Psychiatric: She has a normal mood and affect. Her behavior  is normal.    ED Course  Procedures (including critical care time) Labs Review Labs Reviewed  COMPREHENSIVE METABOLIC PANEL  CBC WITH DIFFERENTIAL  LIPASE, BLOOD  URINALYSIS, ROUTINE W REFLEX MICROSCOPIC   Imaging Review No results found.  EKG Interpretation   None       MDM  No diagnosis found. 73 yo F with chief complaint of R flank pain.  Started a couple days ago, cramping, comes and goes. Pain worse with movement.  No other noted inciting factors. Patient with RLQ tenderness on exam, concern due to age, CT scan with constipation.  Will treat with miralax, will have follow up with PCP, return for repeat painful episode.    I have discussed the diagnosis/risks/treatment options with the patient and family and believe the pt to be eligible for discharge home to follow-up with PCP. We also discussed returning  to the ED immediately if new or worsening sx occur. We discussed the sx which are most concerning (e.g., worsening pain, vomiting, fever) that necessitate immediate return. Any new prescriptions provided to the patient are listed below.  Discharge Medication List as of 02/24/2013  9:44 PM         Deno Etienne, MD 02/25/13 [phone]

## 2013-02-25 NOTE — ED Provider Notes (Signed)
Medical screening examination/treatment/procedure(s) were conducted as a shared visit with resident-physician practitioner(s) and myself.  I personally evaluated the patient during the encounter.  Pt is a 73 y.o. female with pmhx as above presenting with RLQ pain with similar episodes about 1 year ago that resolved spontaneously w/o cause.  Pt found to have localized RLQ ttp on my PE, w/o rebound or guarding. Pt well appearing, in NAD.  CT w/ constipation.  Pt safe for d/c with outpt constipation tx.  Return precautions given for new or worsening symptoms including worsening pain.  Neta Ehlers, MD 02/25/13 [phone]

## 2013-03-01 DIAGNOSIS — H01009 Unspecified blepharitis unspecified eye, unspecified eyelid: Secondary | ICD-10-CM | POA: Diagnosis not present

## 2013-03-03 ENCOUNTER — Other Ambulatory Visit: Payer: Self-pay | Admitting: Family Medicine

## 2013-03-07 ENCOUNTER — Other Ambulatory Visit: Payer: Self-pay | Admitting: Family Medicine

## 2013-03-15 ENCOUNTER — Other Ambulatory Visit: Payer: Self-pay | Admitting: Family Medicine

## 2013-03-17 ENCOUNTER — Other Ambulatory Visit: Payer: Self-pay | Admitting: Family Medicine

## 2013-03-21 DIAGNOSIS — H01009 Unspecified blepharitis unspecified eye, unspecified eyelid: Secondary | ICD-10-CM | POA: Diagnosis not present

## 2013-03-27 ENCOUNTER — Ambulatory Visit: Payer: Medicare Other | Admitting: Family Medicine

## 2013-03-27 ENCOUNTER — Other Ambulatory Visit: Payer: Self-pay | Admitting: Family Medicine

## 2013-03-27 NOTE — Telephone Encounter (Signed)
Last seen 05/17/12 and filled 12/15/12 #90. Apt pending.    Please advise      KP

## 2013-03-30 ENCOUNTER — Encounter: Payer: Self-pay | Admitting: Family Medicine

## 2013-03-30 ENCOUNTER — Ambulatory Visit (INDEPENDENT_AMBULATORY_CARE_PROVIDER_SITE_OTHER): Payer: Medicare Other | Admitting: Family Medicine

## 2013-03-30 VITALS — BP 112/74 | HR 76 | Temp 98.1°F | Wt 197.8 lb

## 2013-03-30 DIAGNOSIS — E785 Hyperlipidemia, unspecified: Secondary | ICD-10-CM

## 2013-03-30 DIAGNOSIS — E039 Hypothyroidism, unspecified: Secondary | ICD-10-CM

## 2013-03-30 DIAGNOSIS — E669 Obesity, unspecified: Secondary | ICD-10-CM | POA: Insufficient documentation

## 2013-03-30 DIAGNOSIS — E1159 Type 2 diabetes mellitus with other circulatory complications: Secondary | ICD-10-CM | POA: Diagnosis not present

## 2013-03-30 DIAGNOSIS — M109 Gout, unspecified: Secondary | ICD-10-CM

## 2013-03-30 NOTE — Progress Notes (Signed)
Pre visit review using our clinic review tool, if applicable. No additional management support is needed unless otherwise documented below in the visit note. 

## 2013-03-30 NOTE — Patient Instructions (Signed)

## 2013-03-30 NOTE — Progress Notes (Signed)
Patient ID: [name], female   DOB: Feb 04, 1941, 73 y.o.   MRN: [id number]   Subjective:    Patient ID: [name], female    DOB: [birth year], 73 y.o.   MRN: [id number] HPI Pt here for f/u  HPI HYPERTENSION  Blood pressure range-not checking at home  Chest pain- no      Dyspnea- no Lightheadedness- no   Edema- no Other side effects - no   Medication compliance: good Low salt diet- no  DIABETES  Blood Sugar ranges-not checking  Polyuria- no New Visual problems- no Hypoglycemic symptoms- no Other side effects-no Medication compliance - good Last eye exam- due Foot exam- today  HYPERLIPIDEMIA  Medication compliance- good RUQ pain- no  Muscle aches- no Other side effects-no  ROS See HPI above   PMH Smoking Status noted             Objective:    BP 112/74  Pulse 76  Temp(Src) 98.1 F (36.7 C) (Oral)  Wt 197 lb 12.8 oz (89.721 kg)  SpO2 95% General appearance: alert, cooperative, appears stated age and no distress Throat: lips, mucosa, and tongue normal; teeth and gums normal Neck: no adenopathy, supple, symmetrical, trachea midline and thyroid not enlarged, symmetric, no tenderness/mass/nodules Lungs: clear to auscultation bilaterally Heart: regular rate and rhythm, S1, S2 normal, no murmur, click, rub or gallop Extremities: extremities normal, atraumatic, no cyanosis or edema Sensory exam of the foot is normal, tested with the monofilament. Good pulses, no lesions or ulcers, good peripheral pulses.        Assessment & Plan:  1. Gout con't meds Check labs - Basic metabolic panel; Future - CBC with Differential; Future - Uric acid; Future  2. Unspecified hypothyroidism Check TSH - Basic metabolic panel; Future - CBC with Differential; Future - TSH; Future  3. Other and unspecified hyperlipidemia Check labs con't meds - Basic metabolic panel; Future - CBC with Differential; Future - Hepatic function panel; Future - Lipid  panel; Future  4. Type II or unspecified type diabetes mellitus with peripheral circulatory disorders, uncontrolled(250.72) Check labs - Hemoglobin A1c - Microalbumin / creatinine urine ratio

## 2013-03-31 ENCOUNTER — Telehealth: Payer: Self-pay

## 2013-03-31 NOTE — Telephone Encounter (Signed)
[facility] Education given to patient through mail.

## 2013-04-04 ENCOUNTER — Other Ambulatory Visit: Payer: Medicare Other

## 2013-04-06 ENCOUNTER — Other Ambulatory Visit: Payer: Medicare Other

## 2013-04-06 ENCOUNTER — Other Ambulatory Visit: Payer: Self-pay | Admitting: Family Medicine

## 2013-04-13 ENCOUNTER — Other Ambulatory Visit (INDEPENDENT_AMBULATORY_CARE_PROVIDER_SITE_OTHER): Payer: Medicare Other

## 2013-04-13 DIAGNOSIS — E785 Hyperlipidemia, unspecified: Secondary | ICD-10-CM

## 2013-04-13 DIAGNOSIS — E039 Hypothyroidism, unspecified: Secondary | ICD-10-CM

## 2013-04-13 DIAGNOSIS — M109 Gout, unspecified: Secondary | ICD-10-CM | POA: Diagnosis not present

## 2013-04-13 LAB — CBC WITH DIFFERENTIAL/PLATELET
Basophils Absolute: 0 10*3/uL (ref 0.0–0.1)
Basophils Relative: 0.6 % (ref 0.0–3.0)
Eosinophils Absolute: 0.2 10*3/uL (ref 0.0–0.7)
Eosinophils Relative: 3.1 % (ref 0.0–5.0)
HCT: 38.9 % (ref 36.0–46.0)
Hemoglobin: 12.9 g/dL (ref 12.0–15.0)
Lymphocytes Relative: 34.1 % (ref 12.0–46.0)
Lymphs Abs: 2.3 10*3/uL (ref 0.7–4.0)
MCHC: 33.3 g/dL (ref 30.0–36.0)
MCV: 94.4 fl (ref 78.0–100.0)
MONOS PCT: 9.9 % (ref 3.0–12.0)
Monocytes Absolute: 0.7 10*3/uL (ref 0.1–1.0)
NEUTROS PCT: 52.3 % (ref 43.0–77.0)
Neutro Abs: 3.5 10*3/uL (ref 1.4–7.7)
PLATELETS: 294 10*3/uL (ref 150.0–400.0)
RBC: 4.12 Mil/uL (ref 3.87–5.11)
RDW: 13.8 % (ref 11.5–14.6)
WBC: 6.7 10*3/uL (ref 4.5–10.5)

## 2013-04-13 LAB — LIPID PANEL
CHOL/HDL RATIO: 3
Cholesterol: 163 mg/dL (ref 0–200)
HDL: 52.3 mg/dL (ref >39.00)
LDL CALC: 61 mg/dL (ref 0–99)
TRIGLYCERIDES: 249 mg/dL — AB (ref 0.0–149.0)
VLDL: 49.8 mg/dL — AB (ref 0.0–40.0)

## 2013-04-13 LAB — BASIC METABOLIC PANEL
BUN: 19 mg/dL (ref 6–23)
CALCIUM: 9.1 mg/dL (ref 8.4–10.5)
CO2: 30 mEq/L (ref 19–32)
CREATININE: 1 mg/dL (ref 0.4–1.2)
Chloride: 103 mEq/L (ref 96–112)
GFR: 57.45 mL/min — AB (ref >60.00)
Glucose, Bld: 92 mg/dL (ref 70–99)
Potassium: 3.7 mEq/L (ref 3.5–5.1)
Sodium: 139 mEq/L (ref 135–145)

## 2013-04-13 LAB — URIC ACID: URIC ACID, SERUM: 3.7 mg/dL (ref 2.4–7.0)

## 2013-04-13 LAB — TSH: TSH: 4.24 u[IU]/mL (ref 0.35–5.50)

## 2013-04-13 LAB — HEPATIC FUNCTION PANEL
ALT: 20 U/L (ref 0–35)
AST: 19 U/L (ref 0–37)
Albumin: 3.8 g/dL (ref 3.5–5.2)
Alkaline Phosphatase: 63 U/L (ref 39–117)
BILIRUBIN DIRECT: 0 mg/dL (ref 0.0–0.3)
BILIRUBIN TOTAL: 0.7 mg/dL (ref 0.3–1.2)
Total Protein: 6.3 g/dL (ref 6.0–8.3)

## 2013-04-14 DIAGNOSIS — Z79899 Other long term (current) drug therapy: Secondary | ICD-10-CM | POA: Diagnosis not present

## 2013-04-15 ENCOUNTER — Other Ambulatory Visit: Payer: Self-pay | Admitting: Family Medicine

## 2013-04-19 ENCOUNTER — Other Ambulatory Visit: Payer: Self-pay | Admitting: Family Medicine

## 2013-04-25 ENCOUNTER — Other Ambulatory Visit: Payer: Self-pay | Admitting: Family Medicine

## 2013-05-10 ENCOUNTER — Encounter: Payer: Self-pay | Admitting: Family Medicine

## 2013-05-22 ENCOUNTER — Other Ambulatory Visit: Payer: Self-pay | Admitting: Family Medicine

## 2013-05-31 ENCOUNTER — Other Ambulatory Visit: Payer: Self-pay | Admitting: Family Medicine

## 2013-06-09 DIAGNOSIS — M5137 Other intervertebral disc degeneration, lumbosacral region: Secondary | ICD-10-CM | POA: Diagnosis not present

## 2013-06-09 DIAGNOSIS — M545 Low back pain, unspecified: Secondary | ICD-10-CM | POA: Diagnosis not present

## 2013-06-09 DIAGNOSIS — G894 Chronic pain syndrome: Secondary | ICD-10-CM | POA: Diagnosis not present

## 2013-06-27 DIAGNOSIS — Z961 Presence of intraocular lens: Secondary | ICD-10-CM | POA: Diagnosis not present

## 2013-06-27 DIAGNOSIS — H353190 Nonexudative age-related macular degeneration, unspecified eye, stage unspecified: Secondary | ICD-10-CM | POA: Diagnosis not present

## 2013-06-27 DIAGNOSIS — H524 Presbyopia: Secondary | ICD-10-CM | POA: Diagnosis not present

## 2013-06-27 DIAGNOSIS — H35379 Puckering of macula, unspecified eye: Secondary | ICD-10-CM | POA: Diagnosis not present

## 2013-07-02 ENCOUNTER — Other Ambulatory Visit: Payer: Self-pay | Admitting: Family Medicine

## 2013-07-04 ENCOUNTER — Ambulatory Visit: Payer: Medicare Other | Admitting: Family Medicine

## 2013-07-06 ENCOUNTER — Other Ambulatory Visit: Payer: Self-pay | Admitting: Family Medicine

## 2013-07-20 DIAGNOSIS — M461 Sacroiliitis, not elsewhere classified: Secondary | ICD-10-CM | POA: Diagnosis not present

## 2013-07-20 DIAGNOSIS — M810 Age-related osteoporosis without current pathological fracture: Secondary | ICD-10-CM | POA: Diagnosis not present

## 2013-07-20 DIAGNOSIS — M1710 Unilateral primary osteoarthritis, unspecified knee: Secondary | ICD-10-CM | POA: Diagnosis not present

## 2013-07-20 DIAGNOSIS — M19049 Primary osteoarthritis, unspecified hand: Secondary | ICD-10-CM | POA: Diagnosis not present

## 2013-07-20 DIAGNOSIS — M1A00X0 Idiopathic chronic gout, unspecified site, without tophus (tophi): Secondary | ICD-10-CM | POA: Diagnosis not present

## 2013-07-26 DIAGNOSIS — E669 Obesity, unspecified: Secondary | ICD-10-CM | POA: Diagnosis not present

## 2013-07-26 DIAGNOSIS — M109 Gout, unspecified: Secondary | ICD-10-CM | POA: Diagnosis not present

## 2013-07-26 DIAGNOSIS — E039 Hypothyroidism, unspecified: Secondary | ICD-10-CM | POA: Diagnosis not present

## 2013-07-26 DIAGNOSIS — I10 Essential (primary) hypertension: Secondary | ICD-10-CM | POA: Diagnosis not present

## 2013-07-26 DIAGNOSIS — G4700 Insomnia, unspecified: Secondary | ICD-10-CM | POA: Diagnosis not present

## 2013-07-26 DIAGNOSIS — M810 Age-related osteoporosis without current pathological fracture: Secondary | ICD-10-CM | POA: Diagnosis not present

## 2013-07-28 ENCOUNTER — Other Ambulatory Visit: Payer: Self-pay | Admitting: Family Medicine

## 2013-08-17 ENCOUNTER — Other Ambulatory Visit: Payer: Self-pay | Admitting: Family Medicine

## 2013-08-17 DIAGNOSIS — E785 Hyperlipidemia, unspecified: Secondary | ICD-10-CM

## 2013-08-17 NOTE — Telephone Encounter (Signed)
Refill for lipitor sent to [facility] on [address]

## 2013-09-13 ENCOUNTER — Other Ambulatory Visit: Payer: Self-pay | Admitting: Family Medicine

## 2013-09-25 DIAGNOSIS — M130 Polyarthritis, unspecified: Secondary | ICD-10-CM | POA: Diagnosis not present

## 2013-09-25 DIAGNOSIS — Z79899 Other long term (current) drug therapy: Secondary | ICD-10-CM | POA: Diagnosis not present

## 2013-09-25 DIAGNOSIS — E876 Hypokalemia: Secondary | ICD-10-CM | POA: Diagnosis not present

## 2013-09-25 DIAGNOSIS — E785 Hyperlipidemia, unspecified: Secondary | ICD-10-CM | POA: Diagnosis not present

## 2013-09-25 DIAGNOSIS — M109 Gout, unspecified: Secondary | ICD-10-CM | POA: Diagnosis not present

## 2013-09-25 DIAGNOSIS — E119 Type 2 diabetes mellitus without complications: Secondary | ICD-10-CM | POA: Diagnosis not present

## 2013-09-25 DIAGNOSIS — E079 Disorder of thyroid, unspecified: Secondary | ICD-10-CM | POA: Diagnosis not present

## 2013-09-25 DIAGNOSIS — Z886 Allergy status to analgesic agent status: Secondary | ICD-10-CM | POA: Diagnosis not present

## 2013-09-25 DIAGNOSIS — F29 Unspecified psychosis not due to a substance or known physiological condition: Secondary | ICD-10-CM | POA: Diagnosis not present

## 2013-09-25 DIAGNOSIS — R4182 Altered mental status, unspecified: Secondary | ICD-10-CM | POA: Diagnosis not present

## 2013-09-25 DIAGNOSIS — I10 Essential (primary) hypertension: Secondary | ICD-10-CM | POA: Diagnosis not present

## 2013-10-12 ENCOUNTER — Other Ambulatory Visit: Payer: Self-pay | Admitting: Family Medicine

## 2013-10-18 DIAGNOSIS — G479 Sleep disorder, unspecified: Secondary | ICD-10-CM | POA: Diagnosis not present

## 2013-10-18 DIAGNOSIS — J309 Allergic rhinitis, unspecified: Secondary | ICD-10-CM | POA: Diagnosis not present

## 2013-10-18 DIAGNOSIS — H6690 Otitis media, unspecified, unspecified ear: Secondary | ICD-10-CM | POA: Diagnosis not present

## 2013-11-01 DIAGNOSIS — Z23 Encounter for immunization: Secondary | ICD-10-CM | POA: Diagnosis not present

## 2013-11-01 DIAGNOSIS — Z0000 Encounter for general adult medical examination without abnormal findings: Secondary | ICD-10-CM | POA: Diagnosis not present

## 2013-11-01 DIAGNOSIS — I10 Essential (primary) hypertension: Secondary | ICD-10-CM | POA: Diagnosis not present

## 2013-11-01 DIAGNOSIS — G479 Sleep disorder, unspecified: Secondary | ICD-10-CM | POA: Diagnosis not present

## 2013-11-01 DIAGNOSIS — H9209 Otalgia, unspecified ear: Secondary | ICD-10-CM | POA: Diagnosis not present

## 2013-11-01 DIAGNOSIS — E119 Type 2 diabetes mellitus without complications: Secondary | ICD-10-CM | POA: Diagnosis not present

## 2013-11-01 DIAGNOSIS — E669 Obesity, unspecified: Secondary | ICD-10-CM | POA: Diagnosis not present

## 2013-11-01 DIAGNOSIS — E039 Hypothyroidism, unspecified: Secondary | ICD-10-CM | POA: Diagnosis not present

## 2013-11-07 ENCOUNTER — Other Ambulatory Visit: Payer: Self-pay

## 2013-11-07 MED ORDER — METFORMIN HCL 500 MG PO TABS: ORAL_TABLET | ORAL | Status: AC

## 2013-11-07 MED ORDER — ESCITALOPRAM OXALATE 20 MG PO TABS: ORAL_TABLET | ORAL | Status: AC

## 2013-11-07 MED ORDER — POTASSIUM CHLORIDE CRYS ER 20 MEQ PO TBCR: EXTENDED_RELEASE_TABLET | ORAL | Status: DC

## 2013-11-07 MED ORDER — CELECOXIB 200 MG PO CAPS: ORAL_CAPSULE | ORAL | Status: DC

## 2013-11-08 DIAGNOSIS — H9209 Otalgia, unspecified ear: Secondary | ICD-10-CM | POA: Diagnosis not present

## 2013-11-08 DIAGNOSIS — K1120 Sialoadenitis, unspecified: Secondary | ICD-10-CM | POA: Diagnosis not present

## 2013-11-10 ENCOUNTER — Telehealth: Payer: Self-pay | Admitting: Family Medicine

## 2013-11-10 NOTE — Telephone Encounter (Signed)
[facility] called to stated that patient has transferred care to there office.  All future med request should not be filled.

## 2013-11-10 NOTE — Telephone Encounter (Signed)
noted 

## 2013-11-29 DIAGNOSIS — K1120 Sialoadenitis, unspecified: Secondary | ICD-10-CM | POA: Diagnosis not present

## 2013-11-30 DIAGNOSIS — R8290 Unspecified abnormal findings in urine: Secondary | ICD-10-CM | POA: Diagnosis not present

## 2013-11-30 DIAGNOSIS — R4182 Altered mental status, unspecified: Secondary | ICD-10-CM | POA: Diagnosis not present

## 2013-12-06 ENCOUNTER — Other Ambulatory Visit: Payer: Self-pay | Admitting: Otolaryngology

## 2013-12-06 DIAGNOSIS — K1120 Sialoadenitis, unspecified: Secondary | ICD-10-CM

## 2013-12-07 ENCOUNTER — Ambulatory Visit
Admission: RE | Admit: 2013-12-07 | Discharge: 2013-12-07 | Disposition: A | Discharge status: Home / Self Care | Payer: Medicare Other | Source: Ambulatory Visit | Attending: Otolaryngology | Admitting: Otolaryngology

## 2013-12-07 DIAGNOSIS — K1120 Sialoadenitis, unspecified: Secondary | ICD-10-CM

## 2013-12-07 DIAGNOSIS — K088 Other specified disorders of teeth and supporting structures: Secondary | ICD-10-CM | POA: Diagnosis not present

## 2013-12-07 DIAGNOSIS — R4182 Altered mental status, unspecified: Secondary | ICD-10-CM | POA: Diagnosis not present

## 2013-12-07 DIAGNOSIS — H9202 Otalgia, left ear: Secondary | ICD-10-CM | POA: Diagnosis not present

## 2013-12-07 DIAGNOSIS — N390 Urinary tract infection, site not specified: Secondary | ICD-10-CM | POA: Diagnosis not present

## 2013-12-07 DIAGNOSIS — R911 Solitary pulmonary nodule: Secondary | ICD-10-CM | POA: Diagnosis not present

## 2013-12-07 MED ORDER — IOHEXOL 300 MG/ML  SOLN
75.0000 mL | Freq: Once | INTRAMUSCULAR | Status: AC | PRN
  Administered 2013-12-07: 75 mL via INTRAVENOUS

## 2013-12-13 DIAGNOSIS — H9202 Otalgia, left ear: Secondary | ICD-10-CM | POA: Diagnosis not present

## 2013-12-21 DIAGNOSIS — M461 Sacroiliitis, not elsewhere classified: Secondary | ICD-10-CM | POA: Diagnosis not present

## 2013-12-21 DIAGNOSIS — M19041 Primary osteoarthritis, right hand: Secondary | ICD-10-CM | POA: Diagnosis not present

## 2013-12-21 DIAGNOSIS — M170 Bilateral primary osteoarthritis of knee: Secondary | ICD-10-CM | POA: Diagnosis not present

## 2013-12-21 DIAGNOSIS — M1A00X0 Idiopathic chronic gout, unspecified site, without tophus (tophi): Secondary | ICD-10-CM | POA: Diagnosis not present

## 2013-12-21 DIAGNOSIS — M1180 Other specified crystal arthropathies, unspecified site: Secondary | ICD-10-CM | POA: Diagnosis not present

## 2013-12-21 DIAGNOSIS — M5430 Sciatica, unspecified side: Secondary | ICD-10-CM | POA: Diagnosis not present

## 2014-01-03 DIAGNOSIS — J069 Acute upper respiratory infection, unspecified: Secondary | ICD-10-CM | POA: Diagnosis not present

## 2014-01-10 DIAGNOSIS — J329 Chronic sinusitis, unspecified: Secondary | ICD-10-CM | POA: Diagnosis not present

## 2014-01-10 DIAGNOSIS — J209 Acute bronchitis, unspecified: Secondary | ICD-10-CM | POA: Diagnosis not present

## 2014-04-17 DIAGNOSIS — G4761 Periodic limb movement disorder: Secondary | ICD-10-CM | POA: Diagnosis not present

## 2014-04-17 DIAGNOSIS — G478 Other sleep disorders: Secondary | ICD-10-CM | POA: Diagnosis not present

## 2014-04-17 DIAGNOSIS — G4733 Obstructive sleep apnea (adult) (pediatric): Secondary | ICD-10-CM | POA: Diagnosis not present

## 2014-05-02 DIAGNOSIS — J309 Allergic rhinitis, unspecified: Secondary | ICD-10-CM | POA: Diagnosis not present

## 2014-05-02 DIAGNOSIS — E039 Hypothyroidism, unspecified: Secondary | ICD-10-CM | POA: Diagnosis not present

## 2014-05-02 DIAGNOSIS — E119 Type 2 diabetes mellitus without complications: Secondary | ICD-10-CM | POA: Diagnosis not present

## 2014-05-02 DIAGNOSIS — M810 Age-related osteoporosis without current pathological fracture: Secondary | ICD-10-CM | POA: Diagnosis not present

## 2014-05-02 DIAGNOSIS — I10 Essential (primary) hypertension: Secondary | ICD-10-CM | POA: Diagnosis not present

## 2014-05-29 DIAGNOSIS — G478 Other sleep disorders: Secondary | ICD-10-CM | POA: Diagnosis not present

## 2014-05-29 DIAGNOSIS — G4761 Periodic limb movement disorder: Secondary | ICD-10-CM | POA: Diagnosis not present

## 2014-05-29 DIAGNOSIS — R5383 Other fatigue: Secondary | ICD-10-CM | POA: Diagnosis not present

## 2014-06-21 DIAGNOSIS — M170 Bilateral primary osteoarthritis of knee: Secondary | ICD-10-CM | POA: Diagnosis not present

## 2014-06-21 DIAGNOSIS — M545 Low back pain: Secondary | ICD-10-CM | POA: Diagnosis not present

## 2014-06-21 DIAGNOSIS — M533 Sacrococcygeal disorders, not elsewhere classified: Secondary | ICD-10-CM | POA: Diagnosis not present

## 2014-06-21 DIAGNOSIS — M19041 Primary osteoarthritis, right hand: Secondary | ICD-10-CM | POA: Diagnosis not present

## 2014-06-21 DIAGNOSIS — M1A00X0 Idiopathic chronic gout, unspecified site, without tophus (tophi): Secondary | ICD-10-CM | POA: Diagnosis not present

## 2014-07-11 ENCOUNTER — Telehealth: Payer: Self-pay | Admitting: Family Medicine

## 2014-07-11 NOTE — Telephone Encounter (Signed)
Caller name: [facility]   Pharmacy:  [id number] - [place], [place] - [address] [phone] (Phone) [phone] (Fax)         Reason for call:  Pharmacy requesting a refill levothyroxine (SYNTHROID, LEVOTHROID) 112 MCG tablet

## 2014-07-11 NOTE — Telephone Encounter (Signed)
She needs an OV with fasting labs, once that is scheduled I will send a 30 day supply. Please call her and scheduled.  She is overdue. No med's sent at this time.  KP

## 2014-07-11 NOTE — Telephone Encounter (Signed)
Pt scheduled appointment for 08/06/2014

## 2014-08-06 ENCOUNTER — Ambulatory Visit: Payer: Medicare Other | Admitting: Family Medicine

## 2014-08-06 DIAGNOSIS — K625 Hemorrhage of anus and rectum: Secondary | ICD-10-CM | POA: Diagnosis not present

## 2014-08-06 DIAGNOSIS — R5383 Other fatigue: Secondary | ICD-10-CM | POA: Diagnosis not present

## 2014-08-06 DIAGNOSIS — R229 Localized swelling, mass and lump, unspecified: Secondary | ICD-10-CM | POA: Diagnosis not present

## 2014-08-09 DIAGNOSIS — K921 Melena: Secondary | ICD-10-CM | POA: Diagnosis not present

## 2014-08-09 DIAGNOSIS — K589 Irritable bowel syndrome without diarrhea: Secondary | ICD-10-CM | POA: Diagnosis not present

## 2014-08-28 DIAGNOSIS — G4761 Periodic limb movement disorder: Secondary | ICD-10-CM | POA: Diagnosis not present

## 2014-08-28 DIAGNOSIS — G4700 Insomnia, unspecified: Secondary | ICD-10-CM | POA: Diagnosis not present

## 2014-11-06 DIAGNOSIS — E785 Hyperlipidemia, unspecified: Secondary | ICD-10-CM | POA: Diagnosis not present

## 2014-11-06 DIAGNOSIS — E039 Hypothyroidism, unspecified: Secondary | ICD-10-CM | POA: Diagnosis not present

## 2014-11-06 DIAGNOSIS — Z23 Encounter for immunization: Secondary | ICD-10-CM | POA: Diagnosis not present

## 2014-11-06 DIAGNOSIS — M109 Gout, unspecified: Secondary | ICD-10-CM | POA: Diagnosis not present

## 2014-11-06 DIAGNOSIS — N183 Chronic kidney disease, stage 3 (moderate): Secondary | ICD-10-CM | POA: Diagnosis not present

## 2014-11-06 DIAGNOSIS — Z0000 Encounter for general adult medical examination without abnormal findings: Secondary | ICD-10-CM | POA: Diagnosis not present

## 2014-11-06 DIAGNOSIS — I10 Essential (primary) hypertension: Secondary | ICD-10-CM | POA: Diagnosis not present

## 2014-11-06 DIAGNOSIS — E119 Type 2 diabetes mellitus without complications: Secondary | ICD-10-CM | POA: Diagnosis not present

## 2014-11-06 DIAGNOSIS — G4733 Obstructive sleep apnea (adult) (pediatric): Secondary | ICD-10-CM | POA: Diagnosis not present

## 2014-11-06 DIAGNOSIS — M810 Age-related osteoporosis without current pathological fracture: Secondary | ICD-10-CM | POA: Diagnosis not present

## 2014-11-14 DIAGNOSIS — Z961 Presence of intraocular lens: Secondary | ICD-10-CM | POA: Diagnosis not present

## 2014-11-14 DIAGNOSIS — H1010 Acute atopic conjunctivitis, unspecified eye: Secondary | ICD-10-CM | POA: Diagnosis not present

## 2014-11-14 DIAGNOSIS — H35372 Puckering of macula, left eye: Secondary | ICD-10-CM | POA: Diagnosis not present

## 2014-11-14 DIAGNOSIS — E119 Type 2 diabetes mellitus without complications: Secondary | ICD-10-CM | POA: Diagnosis not present

## 2015-01-14 DIAGNOSIS — M8589 Other specified disorders of bone density and structure, multiple sites: Secondary | ICD-10-CM | POA: Diagnosis not present

## 2015-01-14 DIAGNOSIS — M859 Disorder of bone density and structure, unspecified: Secondary | ICD-10-CM | POA: Diagnosis not present

## 2015-02-28 DIAGNOSIS — Z79899 Other long term (current) drug therapy: Secondary | ICD-10-CM | POA: Diagnosis not present

## 2015-02-28 DIAGNOSIS — M1000 Idiopathic gout, unspecified site: Secondary | ICD-10-CM | POA: Diagnosis not present

## 2015-02-28 DIAGNOSIS — M4056 Lordosis, unspecified, lumbar region: Secondary | ICD-10-CM | POA: Diagnosis not present

## 2015-02-28 DIAGNOSIS — M19041 Primary osteoarthritis, right hand: Secondary | ICD-10-CM | POA: Diagnosis not present

## 2015-02-28 DIAGNOSIS — M1A00X0 Idiopathic chronic gout, unspecified site, without tophus (tophi): Secondary | ICD-10-CM | POA: Diagnosis not present

## 2015-02-28 DIAGNOSIS — M545 Low back pain: Secondary | ICD-10-CM | POA: Diagnosis not present

## 2015-02-28 DIAGNOSIS — M461 Sacroiliitis, not elsewhere classified: Secondary | ICD-10-CM | POA: Diagnosis not present

## 2015-03-01 LAB — HEPATIC FUNCTION PANEL
ALK PHOS: 62 U/L (ref 25–125)
ALT: 21 U/L (ref 7–35)
AST: 18 U/L (ref 13–35)
Bilirubin, Total: 0.4 mg/dL

## 2015-03-01 LAB — CBC AND DIFFERENTIAL
HEMATOCRIT: 41 % (ref 36–46)
HEMOGLOBIN: 13.9 g/dL (ref 12.0–16.0)
PLATELETS: 267 10*3/uL (ref 150–399)
WBC: 6 10*3/mL

## 2015-03-01 LAB — BASIC METABOLIC PANEL
BUN: 21 mg/dL (ref 4–21)
Creatinine: 1 mg/dL (ref 0.5–1.1)
Glucose: 120 mg/dL
Potassium: 3.9 mmol/L (ref 3.4–5.3)
Sodium: 140 mmol/L (ref 137–147)

## 2015-07-23 DIAGNOSIS — E119 Type 2 diabetes mellitus without complications: Secondary | ICD-10-CM | POA: Diagnosis not present

## 2015-07-23 DIAGNOSIS — E785 Hyperlipidemia, unspecified: Secondary | ICD-10-CM | POA: Diagnosis not present

## 2015-07-23 DIAGNOSIS — E538 Deficiency of other specified B group vitamins: Secondary | ICD-10-CM | POA: Diagnosis not present

## 2015-07-23 DIAGNOSIS — Z7984 Long term (current) use of oral hypoglycemic drugs: Secondary | ICD-10-CM | POA: Diagnosis not present

## 2015-07-23 DIAGNOSIS — F411 Generalized anxiety disorder: Secondary | ICD-10-CM | POA: Diagnosis not present

## 2015-07-23 DIAGNOSIS — R399 Unspecified symptoms and signs involving the genitourinary system: Secondary | ICD-10-CM | POA: Diagnosis not present

## 2015-07-23 DIAGNOSIS — H1013 Acute atopic conjunctivitis, bilateral: Secondary | ICD-10-CM | POA: Diagnosis not present

## 2015-07-23 DIAGNOSIS — E039 Hypothyroidism, unspecified: Secondary | ICD-10-CM | POA: Diagnosis not present

## 2015-07-25 DIAGNOSIS — M4316 Spondylolisthesis, lumbar region: Secondary | ICD-10-CM | POA: Diagnosis not present

## 2015-07-25 DIAGNOSIS — M4806 Spinal stenosis, lumbar region: Secondary | ICD-10-CM | POA: Diagnosis not present

## 2015-07-28 ENCOUNTER — Emergency Department (HOSPITAL_COMMUNITY): Payer: Medicare Other

## 2015-07-28 ENCOUNTER — Emergency Department (HOSPITAL_COMMUNITY)
Admission: EM | Admit: 2015-07-28 | Discharge: 2015-07-28 | Disposition: A | Discharge status: Home / Self Care | Payer: Medicare Other | Attending: Emergency Medicine | Admitting: Emergency Medicine

## 2015-07-28 ENCOUNTER — Other Ambulatory Visit: Payer: Self-pay

## 2015-07-28 ENCOUNTER — Encounter (HOSPITAL_COMMUNITY): Payer: Self-pay

## 2015-07-28 DIAGNOSIS — R519 Headache, unspecified: Secondary | ICD-10-CM

## 2015-07-28 DIAGNOSIS — Z7982 Long term (current) use of aspirin: Secondary | ICD-10-CM | POA: Insufficient documentation

## 2015-07-28 DIAGNOSIS — Z0389 Encounter for observation for other suspected diseases and conditions ruled out: Secondary | ICD-10-CM | POA: Diagnosis not present

## 2015-07-28 DIAGNOSIS — Z7984 Long term (current) use of oral hypoglycemic drugs: Secondary | ICD-10-CM | POA: Insufficient documentation

## 2015-07-28 DIAGNOSIS — Z87891 Personal history of nicotine dependence: Secondary | ICD-10-CM | POA: Insufficient documentation

## 2015-07-28 DIAGNOSIS — R0602 Shortness of breath: Secondary | ICD-10-CM | POA: Insufficient documentation

## 2015-07-28 DIAGNOSIS — I10 Essential (primary) hypertension: Secondary | ICD-10-CM | POA: Insufficient documentation

## 2015-07-28 DIAGNOSIS — E119 Type 2 diabetes mellitus without complications: Secondary | ICD-10-CM | POA: Insufficient documentation

## 2015-07-28 DIAGNOSIS — Z85828 Personal history of other malignant neoplasm of skin: Secondary | ICD-10-CM | POA: Insufficient documentation

## 2015-07-28 DIAGNOSIS — R112 Nausea with vomiting, unspecified: Secondary | ICD-10-CM | POA: Diagnosis not present

## 2015-07-28 DIAGNOSIS — Z8522 Personal history of malignant neoplasm of nasal cavities, middle ear, and accessory sinuses: Secondary | ICD-10-CM | POA: Diagnosis not present

## 2015-07-28 DIAGNOSIS — R51 Headache: Secondary | ICD-10-CM | POA: Insufficient documentation

## 2015-07-28 DIAGNOSIS — E785 Hyperlipidemia, unspecified: Secondary | ICD-10-CM | POA: Diagnosis not present

## 2015-07-28 LAB — CBC
HCT: 42.8 % (ref 36.0–46.0)
Hemoglobin: 14.6 g/dL (ref 12.0–15.0)
MCH: 31.5 pg (ref 26.0–34.0)
MCHC: 34.1 g/dL (ref 30.0–36.0)
MCV: 92.4 fL (ref 78.0–100.0)
PLATELETS: 279 10*3/uL (ref 150–400)
RBC: 4.63 MIL/uL (ref 3.87–5.11)
RDW: 12.4 % (ref 11.5–15.5)
WBC: 10 10*3/uL (ref 4.0–10.5)

## 2015-07-28 LAB — COMPREHENSIVE METABOLIC PANEL
ALT: 28 U/L (ref 14–54)
ANION GAP: 10 (ref 5–15)
AST: 27 U/L (ref 15–41)
Albumin: 3.9 g/dL (ref 3.5–5.0)
Alkaline Phosphatase: 77 U/L (ref 38–126)
BUN: 12 mg/dL (ref 6–20)
CALCIUM: 9.7 mg/dL (ref 8.9–10.3)
CHLORIDE: 104 mmol/L (ref 101–111)
CO2: 26 mmol/L (ref 22–32)
Creatinine, Ser: 1.04 mg/dL — ABNORMAL HIGH (ref 0.44–1.00)
GFR, EST AFRICAN AMERICAN: 59 mL/min — AB (ref >60)
GFR, EST NON AFRICAN AMERICAN: 50 mL/min — AB (ref >60)
Glucose, Bld: 150 mg/dL — ABNORMAL HIGH (ref 65–99)
POTASSIUM: 3.5 mmol/L (ref 3.5–5.1)
SODIUM: 140 mmol/L (ref 135–145)
TOTAL PROTEIN: 6.3 g/dL — AB (ref 6.5–8.1)
Total Bilirubin: 0.3 mg/dL (ref 0.3–1.2)

## 2015-07-28 LAB — DIFFERENTIAL
BASOS PCT: 0 %
Basophils Absolute: 0 10*3/uL (ref 0.0–0.1)
EOS ABS: 0.2 10*3/uL (ref 0.0–0.7)
EOS PCT: 2 %
Lymphocytes Relative: 29 %
Lymphs Abs: 2.8 10*3/uL (ref 0.7–4.0)
MONO ABS: 0.9 10*3/uL (ref 0.1–1.0)
MONOS PCT: 9 %
NEUTROS ABS: 6 10*3/uL (ref 1.7–7.7)
Neutrophils Relative %: 60 %

## 2015-07-28 LAB — APTT: APTT: 32 s (ref 24–37)

## 2015-07-28 LAB — I-STAT TROPONIN, ED: TROPONIN I, POC: 0 ng/mL (ref 0.00–0.08)

## 2015-07-28 MED ORDER — KETOROLAC TROMETHAMINE 30 MG/ML IJ SOLN
30.0000 mg | Freq: Once | INTRAMUSCULAR | Status: AC
  Administered 2015-07-28: 30 mg via INTRAVENOUS
  Filled 2015-07-28: qty 1

## 2015-07-28 MED ORDER — PROCHLORPERAZINE EDISYLATE 5 MG/ML IJ SOLN
5.0000 mg | Freq: Once | INTRAMUSCULAR | Status: AC
  Administered 2015-07-28: 5 mg via INTRAVENOUS
  Filled 2015-07-28: qty 2

## 2015-07-28 MED ORDER — DIPHENHYDRAMINE HCL 50 MG/ML IJ SOLN
12.5000 mg | Freq: Once | INTRAMUSCULAR | Status: AC
  Administered 2015-07-28: 12.5 mg via INTRAVENOUS
  Filled 2015-07-28: qty 1

## 2015-07-28 NOTE — ED Provider Notes (Signed)
CSN: [id number]     Arrival date & time 07/28/15  [id number] History   First MD Initiated Contact with Patient 07/28/15 17-May-1122     Chief Complaint  Patient presents with  . Headache     (Consider location/radiation/quality/duration/timing/severity/associated sxs/prior Treatment) HPI  Headache started yesterday morning frontal headache between sinuses, Began slowly yesterday morning, and worsened throughout the day, and became severe last night, making sleep difficult. was 10/10 now 8/10, tried tramadol, fluticasone which didn't help.  Has had some rhinorrhea. No long travel.  Thinks it would be worse with bright lights.  No neurologic symptoms. Sent from urgent care for work up given PMHx of viral meningitis. Temp there 99.5, no fevers.   Associated nausea no vomiting   Past Medical History  Diagnosis Date  . Genital herpes, unspecified   . Abdominal pain, unspecified site   . Other specified intestinal malabsorption ([place])   . Esophageal reflux   . Obstructive sleep apnea (adult) (pediatric)      - PSG 09/05/09 AHI 14.2  . Memory loss   . Backache, unspecified   . Benign neoplasm of skin, site unspecified   . Tachycardia, unspecified   . Altered mental status   . Sinus tachycardia ([place])   . Anxiety   . Circadian rhythm sleep disorder, delayed sleep phase type   . Restless legs syndrome (RLS)   . Insomnia, unspecified   . Urinary tract infection, site not specified   . Concussion with no loss of consciousness   . Generalized osteoarthrosis, unspecified site   . Duodenitis without mention of hemorrhage   . Diaphragmatic hernia without mention of obstruction or gangrene   . Personal history of colonic polyps   . Diverticulosis of colon (without mention of hemorrhage)   . Diarrhea   . Allergic rhinitis due to pollen   . Viral meningitis   . Delirium   . DM II (diabetes mellitus, type II), controlled ([place])   . Other and unspecified hyperlipidemia   . Mole (skin)   . 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 Osteoporosis, unspecified   . Shingles   . Routine general medical examination at a health care facility   . Impacted cerumen   . Unspecified fall   . Other abnormal blood chemistry   . Family history of diabetes mellitus   . Family history of psychiatric condition   . Shortness of breath   . Bilateral leg edema   . Obsessive-compulsive disorders   . Unspecified hypothyroidism   . Unspecified essential hypertension   . Gout, unspecified    Past Surgical History  Procedure Laterality Date  . Carpal tunnel release  05/17/2003    bilateral  . Nasal sinus surgery  05-17-98  . Colonoscopy  1991, 3.17.2008  . Laminectomy  11.15.07    L3-L4  . Cataract surgery both eyes  05-17-11   Family History  Problem Relation Age of Onset  . Alzheimer's disease Mother 23    died May 17, 2006  . Mental illness Mother     dementia  . Depression Other   . Diabetes Other   . Cancer Other     breast, colon, kidney   Social History  Substance Use Topics  . Smoking status: Former Smoker -- 1.00 packs/day for 4 years    Types: Cigarettes    Quit date: 04/16/1970  . Smokeless tobacco: Never Used  . Alcohol Use: No   OB History    No data available  Review of Systems  Constitutional: Negative for fever.  HENT: Negative for sore throat.   Eyes: Negative for visual disturbance.  Respiratory: Negative for cough and shortness of breath.   Cardiovascular: Negative for chest pain.  Gastrointestinal: Positive for nausea and vomiting. Negative for abdominal pain.  Genitourinary: Negative for difficulty urinating.  Musculoskeletal: Negative for back pain and neck pain.  Skin: Negative for rash.  Neurological: Positive for headaches. Negative for syncope.      Allergies  Codeine  Home Medications   Prior to Admission medications   Medication Sig Start Date End Date Taking? Authorizing Provider  alendronate (FOSAMAX) 70 MG tablet TAKE 1 TABLET (70 MG TOTAL)  BY MOUTH EVERY 7 (SEVEN) DAYS. TAKES ON FRI 04/25/13  Yes Yvonne R Lowne Chase, DO  amLODipine (NORVASC) 5 MG tablet TAKE 1 TABLET BY MOUTH EVERY DAY   Yes Yvonne R Lowne Chase, DO  Artificial Saliva (BIOTENE MOISTURIZING MOUTH) SOLN Use as directed in the mouth or throat daily. Swish and spit. Exact amount taken unknown.   Yes Historical Provider, MD  aspirin EC 81 MG tablet Take 81 mg by mouth every morning.    Yes Historical Provider, MD  atorvastatin (LIPITOR) 10 MG tablet Take 10 mg by mouth daily.   Yes Historical Provider, MD  atorvastatin (LIPITOR) 20 MG tablet TAKE 1 TABLET BY MOUTH IN THE EVENING 08/17/13  Yes Yvonne R Lowne Chase, DO  clonazePAM (KLONOPIN) 0.5 MG tablet TAKE 1 TABLET BY MOUTH 3 TIMES A DAY AS NEEDED FOR ANXIETY 07/06/13  Yes Yvonne R Lowne Chase, DO  clonazePAM (KLONOPIN) 0.5 MG tablet Take 0.5 mg by mouth daily as needed for anxiety.   Yes Historical Provider, MD  colchicine 0.6 MG tablet Take 0.6 mg by mouth daily as needed. For gout   Yes Historical Provider, MD  fexofenadine (ALLEGRA) 180 MG tablet Take 180 mg by mouth every evening.    Yes Historical Provider, MD  hydrochlorothiazide (HYDRODIURIL) 25 MG tablet TAKE 1 TABLET BY MOUTH EVERY DAY 07/06/13  Yes Yvonne R Lowne Chase, DO  levothyroxine (SYNTHROID, LEVOTHROID) 112 MCG tablet TAKE 1 TABLET BY MOUTH EVERY MORNING 07/28/13  Yes Yvonne R Lowne Chase, DO  Melatonin 3 MG TABS Take 1 tablet by mouth daily.   Yes Historical Provider, MD  metFORMIN (GLUCOPHAGE) 500 MG tablet TAKE 2 TABLETS BY MOUTH EVERY EVENING Patient taking differently: Take 500 mg by mouth 2 (two) times daily with a meal.  11/07/13  Yes Yvonne R Lowne Chase, DO  mometasone (NASONEX) 50 MCG/ACT nasal spray Place 2 sprays into the nose daily as needed. For nasal congestion 08/22/12  Yes Yvonne R Lowne Chase, DO  Multiple Vitamins-Minerals (RA VISION-VITE PRESERVE PO) Take 2 capsules by mouth daily.   Yes Historical Provider, MD  OVER THE COUNTER MEDICATION  Take 1 tablet by mouth daily. Medication Name: Barley Grass 500mg    Yes Historical Provider, MD  potassium chloride SA (KLOR-CON M20) 20 MEQ tablet TAKE 1 TABLET BY MOUTH EVERY DAY **LABS ARE DUE** 11/07/13  Yes Yvonne R Lowne Chase, DO  sertraline (ZOLOFT) 50 MG tablet Take 25 mg by mouth daily.   Yes Historical Provider, MD  traMADol (ULTRAM) 50 MG tablet Take 50 mg by mouth every 6 (six) hours as needed for moderate pain.   Yes Historical Provider, MD  ULORIC 40 MG tablet Take 1 tablet by mouth every morning.  07/29/11  Yes Historical Provider, MD  valACYclovir (VALTREX) 1000 MG tablet Take 1,000 mg by  mouth 3 (three) times daily as needed. For Herpes flare up 03/01/12  Yes Alferd Apa Lowne Chase, DO  atorvastatin (LIPITOR) 20 MG tablet 1 tab by mouth in the evening--Labs are due now 09/13/13   Ann Held, DO  BENICAR 20 MG tablet TAKE 1 TABLET EVERY DAY    Yvonne R Lowne Chase, DO  BENICAR 20 MG tablet TAKE 1 TABLET EVERY DAY    Yvonne R Lowne Chase, DO  celecoxib (CELEBREX) 200 MG capsule TAKE ONE CAPSULE BY MOUTH EVERY DAY 11/07/13   Rosalita Chessman Chase, DO  escitalopram (LEXAPRO) 20 MG tablet TAKE 1 TABLET IN THE EVENING 11/07/13   Alferd Apa Lowne Chase, DO  esomeprazole (NEXIUM) 40 MG capsule TAKE ONE CAPSULE BY MOUTH EVERY DAY BEFORE BREAKFAST    Yvonne R Lowne Chase, DO  hydrochlorothiazide (HYDRODIURIL) 25 MG tablet TAKE 1 TABLET EVERY DAY 09/13/13   Rosalita Chessman Chase, DO  Lecithin 400 MG CAPS Take 1 capsule by mouth daily.    Historical Provider, MD  levothyroxine (SYNTHROID, LEVOTHROID) 112 MCG tablet TAKE 1 TABLET BY MOUTH EVERY MORNING 09/13/13   Alferd Apa Lowne Chase, DO  olmesartan (BENICAR) 20 MG tablet Take 20 mg by mouth daily. 1 tab by mouth daily--office visit due now 02/15/13   Ann Held, DO  Probiotic Product (HEALTHY COLON PO) Take by mouth.    Historical Provider, MD   BP 128/93 mmHg  Pulse 99  Temp(Src) 98.6 F (37 C) (Oral)  Resp 14  Ht 5\' 6"  (1.676 m)  Wt  190 lb (86.183 kg)  BMI 30.68 kg/m2  SpO2 96% Physical Exam  Constitutional: She is oriented to person, place, and time. She appears well-developed and well-nourished. No distress.  HENT:  Head: Normocephalic and atraumatic.  Eyes: Conjunctivae and EOM are normal.  Neck: Normal range of motion.  Cardiovascular: Normal rate, regular rhythm, normal heart sounds and intact distal pulses.  Exam reveals no gallop and no friction rub.   No murmur heard. Pulmonary/Chest: Effort normal and breath sounds normal. No respiratory distress. She has no wheezes. She has no rales.  Abdominal: Soft. She exhibits no distension. There is no tenderness. There is no guarding.  Musculoskeletal: She exhibits no edema or tenderness.  Neurological: She is alert and oriented to person, place, and time. She has normal strength. No cranial nerve deficit or sensory deficit. Coordination and gait normal. GCS eye subscore is 4. GCS verbal subscore is 5. GCS motor subscore is 6.  Skin: Skin is warm and dry. No rash noted. She is not diaphoretic. No erythema.  Nursing note and vitals reviewed.   ED Course  Procedures (including critical care time) Labs Review Labs Reviewed  COMPREHENSIVE METABOLIC PANEL - Abnormal; Notable for the following:    Glucose, Bld 150 (*)    Creatinine, Ser 1.04 (*)    Total Protein 6.3 (*)    GFR calc non Af Amer 50 (*)    GFR calc Af Amer 59 (*)    All other components within normal limits  APTT  CBC  DIFFERENTIAL  I-STAT TROPOININ, ED    Imaging Review Ct Head Wo Contrast  07/28/2015  CLINICAL DATA:  Severe headache. EXAM: CT HEAD WITHOUT CONTRAST TECHNIQUE: Contiguous axial images were obtained from the base of the skull through the vertex without intravenous contrast. COMPARISON:  MRI of the head 06/22/2011 FINDINGS: Brain: No evidence of acute infarction, hemorrhage, extra-axial collection, ventriculomegaly, or mass effect. There is moderate  brain parenchymal atrophy and chronic  small vessel disease changes. Vascular: No hyperdense vessel or unexpected calcification. Skull: Negative for fracture or focal lesion. Sinuses/Orbits: No acute findings. Other: None. IMPRESSION: No acute intracranial abnormality. Stable moderate brain parenchymal atrophy and chronic microvascular disease. Electronically Signed   By: Fidela Salisbury M.D.   On: 07/28/2015 10:32   I have personally reviewed and evaluated these images and lab results as part of my medical decision-making.   EKG Interpretation   Date/Time:  Sunday July 28 2015 09:50:39 EDT Ventricular Rate:  122 PR Interval:  166 QRS Duration: 86 QT Interval:  310 QTC Calculation: 441 R Axis:   31 Text Interpretation:  Sinus tachycardia with occasional Premature  ventricular complexes Low voltage QRS Borderline ECG Since prior ECG, rate  has increased No other significant changes Confirmed by [facility] MD,  Kwadwo Taras ([id number]) on 07/28/2015 6:57:35 PM      MDM   Final diagnoses:  Acute nonintractable headache, unspecified headache type   75 year old female with a history of hypertension, hypothyroidism, diabetes, viral meningitis, restless leg syndrome, presents with concern for headache.  Headache began slowly, no trauma, no fevers, and normal neurologic exam and have low suspicion for [facility], SDH, stroke, or meningitis. Doubt temporal arteritis/bacterial sinusitis.  Patient was given compazine and benadryl and toradol with improvement in headache.  Patient discharged in stable condition with understanding of reasons to return and recommendation for PCP follow up.   Gareth Morgan, MD 07/28/15 [phone]

## 2015-07-28 NOTE — Discharge Instructions (Signed)

## 2015-07-28 NOTE — ED Notes (Signed)
Pt ambulated to rm B15 without difficulty

## 2015-07-28 NOTE — ED Notes (Addendum)
Patient complains of severe headache x 1 day, denies trauma. No nausea, no blurred vision. Alert and oriented. Seen at urgent care this am and reports fever 99.5. Has had encephalitis in past  Has unsteady gait on arrival but daughter states that is not always abnormal for patient

## 2015-07-28 NOTE — ED Notes (Signed)
Pt was in ICU in 2012-- with brain swelling, encephalitis, was sent here from Urgent care

## 2015-08-29 DIAGNOSIS — M4316 Spondylolisthesis, lumbar region: Secondary | ICD-10-CM | POA: Diagnosis not present

## 2015-08-29 DIAGNOSIS — M4806 Spinal stenosis, lumbar region: Secondary | ICD-10-CM | POA: Diagnosis not present

## 2015-09-17 DIAGNOSIS — H35373 Puckering of macula, bilateral: Secondary | ICD-10-CM | POA: Diagnosis not present

## 2015-09-17 DIAGNOSIS — H35372 Puckering of macula, left eye: Secondary | ICD-10-CM | POA: Diagnosis not present

## 2015-09-17 DIAGNOSIS — Z961 Presence of intraocular lens: Secondary | ICD-10-CM | POA: Diagnosis not present

## 2015-09-17 DIAGNOSIS — H35371 Puckering of macula, right eye: Secondary | ICD-10-CM | POA: Diagnosis not present

## 2015-09-17 DIAGNOSIS — E119 Type 2 diabetes mellitus without complications: Secondary | ICD-10-CM | POA: Diagnosis not present

## 2015-10-01 ENCOUNTER — Other Ambulatory Visit: Payer: Self-pay | Admitting: Surgery

## 2015-10-01 DIAGNOSIS — M48061 Spinal stenosis, lumbar region without neurogenic claudication: Secondary | ICD-10-CM

## 2015-10-01 DIAGNOSIS — M545 Low back pain: Secondary | ICD-10-CM

## 2015-10-09 ENCOUNTER — Ambulatory Visit
Admission: RE | Admit: 2015-10-09 | Discharge: 2015-10-09 | Disposition: A | Discharge status: Home / Self Care | Payer: Medicare Other | Source: Ambulatory Visit | Attending: Surgery | Admitting: Surgery

## 2015-10-09 DIAGNOSIS — M4806 Spinal stenosis, lumbar region: Secondary | ICD-10-CM | POA: Diagnosis not present

## 2015-10-09 DIAGNOSIS — M48061 Spinal stenosis, lumbar region without neurogenic claudication: Secondary | ICD-10-CM

## 2015-10-09 DIAGNOSIS — M545 Low back pain: Secondary | ICD-10-CM

## 2015-10-24 DIAGNOSIS — M4316 Spondylolisthesis, lumbar region: Secondary | ICD-10-CM | POA: Diagnosis not present

## 2015-10-24 DIAGNOSIS — M4806 Spinal stenosis, lumbar region: Secondary | ICD-10-CM | POA: Diagnosis not present

## 2015-12-04 ENCOUNTER — Other Ambulatory Visit: Payer: Self-pay | Admitting: Radiology

## 2015-12-04 DIAGNOSIS — M1A09X0 Idiopathic chronic gout, multiple sites, without tophus (tophi): Secondary | ICD-10-CM

## 2015-12-04 DIAGNOSIS — Z79899 Other long term (current) drug therapy: Secondary | ICD-10-CM

## 2015-12-04 MED ORDER — FEBUXOSTAT 40 MG PO TABS: 40.0000 mg | ORAL_TABLET | Freq: Every day | ORAL | 0 refills | Status: DC

## 2015-12-04 NOTE — Telephone Encounter (Signed)
I spoke to her daughter regarding Uloric refill. She has ran out. I have previously advised labs due, daughter indicates patient has dementia and does not recall any of this. Patient has made appt for 12/10/15/ can do labs then, have placed order.

## 2015-12-09 ENCOUNTER — Encounter: Payer: Self-pay | Admitting: *Deleted

## 2015-12-09 DIAGNOSIS — D649 Anemia, unspecified: Secondary | ICD-10-CM

## 2015-12-09 DIAGNOSIS — M51369 Other intervertebral disc degeneration, lumbar region without mention of lumbar back pain or lower extremity pain: Secondary | ICD-10-CM

## 2015-12-09 DIAGNOSIS — K589 Irritable bowel syndrome without diarrhea: Secondary | ICD-10-CM | POA: Insufficient documentation

## 2015-12-09 DIAGNOSIS — M170 Bilateral primary osteoarthritis of knee: Secondary | ICD-10-CM | POA: Insufficient documentation

## 2015-12-09 DIAGNOSIS — M19041 Primary osteoarthritis, right hand: Secondary | ICD-10-CM | POA: Insufficient documentation

## 2015-12-09 DIAGNOSIS — M5136 Other intervertebral disc degeneration, lumbar region: Secondary | ICD-10-CM

## 2015-12-09 DIAGNOSIS — M19042 Primary osteoarthritis, left hand: Secondary | ICD-10-CM

## 2015-12-09 DIAGNOSIS — M1120 Other chondrocalcinosis, unspecified site: Secondary | ICD-10-CM | POA: Insufficient documentation

## 2015-12-09 HISTORY — DX: Primary osteoarthritis, right hand: M19.041

## 2015-12-09 HISTORY — DX: Other intervertebral disc degeneration, lumbar region: M51.36

## 2015-12-09 HISTORY — DX: Irritable bowel syndrome, unspecified: K58.9

## 2015-12-09 HISTORY — DX: Bilateral primary osteoarthritis of knee: M17.0

## 2015-12-09 HISTORY — DX: Other intervertebral disc degeneration, lumbar region without mention of lumbar back pain or lower extremity pain: M51.369

## 2015-12-09 HISTORY — DX: Anemia, unspecified: D64.9

## 2015-12-09 NOTE — Progress Notes (Addendum)
*IMAGE* Office Visit Note  Patient: [name]             Date of Birth: 12/16/1940           MRN: [id number]             PCP: Jonathon Bellows, MD Referring: Maurice Small, MD Visit Date: 12/10/2015 Occupation:Retired    Subjective:  LBP   History of Present Illness: [name] is a 75 y.o. female . She denies any gout flare since the last visit. She hasn't had a flare in long time. He continues to have some lower back pain and which is chronic. She is not using a brace now. Hand pain is tolerable and no swelling area. She reports some discomfort in knee joints and no swelling . She's been tolerating her medications well.  Activities of Daily Living:  Patient reports morning stiffness for minutes.   Patient Denies nocturnal pain.  Difficulty dressing/grooming: Denies Difficulty climbing stairs: Reports Difficulty getting out of chair: Reports Difficulty using hands for taps, buttons, cutlery, and/or writing: Denies   Review of Systems  Constitutional: Positive for fatigue. Negative for night sweats, weight gain, weight loss and weakness.  HENT: Positive for mouth dryness. Negative for mouth sores, trouble swallowing, trouble swallowing and nose dryness.   Eyes: Negative for pain, redness, visual disturbance and dryness.  Respiratory: Negative for cough, shortness of breath and difficulty breathing.   Cardiovascular: Negative for chest pain, palpitations, hypertension, irregular heartbeat and swelling in legs/feet.  Gastrointestinal: Negative for blood in stool, constipation and diarrhea.  Endocrine: Negative for increased urination.  Genitourinary: Negative for vaginal dryness.  Musculoskeletal: Positive for arthralgias, joint pain and morning stiffness. Negative for joint swelling, myalgias, muscle weakness, muscle tenderness and myalgias.  Skin: Negative for color change, rash, hair loss, skin tightness, ulcers and sensitivity to sunlight.  Allergic/Immunologic:  Negative for susceptible to infections.  Neurological: Positive for memory loss. Negative for dizziness and night sweats.  Hematological: Negative for swollen glands.  Psychiatric/Behavioral: Negative for depressed mood and sleep disturbance. The patient is not nervous/anxious.     PMFS History:  Patient Active Problem List   Diagnosis Date Noted  . Osteoarthritis of both hands 12/09/2015  . Osteoarthritis of both knees 12/09/2015  . DDD (degenerative disc disease), lumbar 12/09/2015  . IBS (irritable bowel syndrome) 12/09/2015  . Anemia 12/09/2015  . Pseudogout 12/09/2015  . Obesity (BMI 30-39.9) 03/30/2013  . Persistent disorder of initiating or maintaining sleep 09/27/2012  . Neuroleptic-induced tardive dyskinesia 12/14/2011  . Gait disturbance 07/04/2010  . Bipolar depression ([place]) 07/04/2010  . Falling episodes 05/02/2010  . GENITAL HERPES 03/10/2010  . GERD 12/17/2009  . OTHER SPECIFIED INTESTINAL MALABSORPTION 12/17/2009  . OBSTRUCTIVE SLEEP APNEA 11/07/2009  . MEMORY LOSS 10/29/2009  . ALTERED MENTAL STATUS 09/20/2009  . TACHYCARDIA 09/20/2009  . SINUS TACHYCARDIA 08/19/2009  . RESTLESS LEG SYNDROME 08/07/2009  . CONCUSSION WITH NO LOSS OF CONSCIOUSNESS 07/26/2009  . DUODENITIS, WITHOUT HEMORRHAGE 07/18/2008  . HIATAL HERNIA WITH REFLUX 07/18/2008  . DIVERTICULOSIS, COLON 07/18/2008  . COLONIC POLYPS, HYPERPLASTIC, HX OF 07/18/2008  . ALLERGIC RHINITIS DUE TO POLLEN 05/02/2008  . Anxiety state, unspecified 12/22/2007  . DELIRIUM 12/22/2007  . VIRAL MENINGITIS, HX OF 12/22/2007  . DIABETES MELLITUS, TYPE II 08/26/2007  . BENIGN NEOPLASM OF EAR&EXTERNAL AUDITORY CANAL 03/21/2007  . Benign neoplasm of skin, site unspecified 03/21/2007  . HYPERLIPIDEMIA 03/21/2007  . Osteoporosis 03/14/2007  . SHINGLES 01/25/2007  . FASTING HYPERGLYCEMIA 07/30/2006  .  HYPOTHYROIDISM 07/14/2006  . Gout 07/14/2006  . OBSESSIVE-COMPULSIVE DISORDER 07/14/2006  . HYPERTENSION  07/14/2006  . LEG EDEMA, BILATERAL 07/14/2006    Past Medical History:  Diagnosis Date  . Abdominal pain, unspecified site   . Allergic rhinitis due to pollen   . Altered mental status   . Anemia 12/09/2015  . Anxiety   . Backache, unspecified   . 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 Benign neoplasm of skin, site unspecified   . Bilateral leg edema   . Circadian rhythm sleep disorder, delayed sleep phase type   . Concussion with no loss of consciousness   . DDD (degenerative disc disease), lumbar 12/09/2015  . Delirium   . Diaphragmatic hernia without mention of obstruction or gangrene   . Diarrhea   . Diverticulosis of colon (without mention of hemorrhage)   . DM II (diabetes mellitus, type II), controlled ([place])   . Duodenitis without mention of hemorrhage   . Esophageal reflux   . Family history of diabetes mellitus   . Family history of psychiatric condition   . Generalized osteoarthrosis, unspecified site   . Genital herpes, unspecified   . Gout, unspecified   . IBS (irritable bowel syndrome) 12/09/2015  . Impacted cerumen   . Insomnia, unspecified   . Memory loss   . Mole (skin)   . Obsessive-compulsive disorders   . Obstructive sleep apnea (adult) (pediatric)     - PSG 09/05/09 AHI 14.2  . Osteoarthritis of both hands 12/09/2015  . Osteoarthritis of both knees 12/09/2015  . Osteoporosis, unspecified   . Other abnormal blood chemistry   . Other and unspecified hyperlipidemia   . Other specified intestinal malabsorption   . Personal history of colonic polyps   . Restless legs syndrome (RLS)   . Routine general medical examination at a health care facility   . Shingles   . Shortness of breath   . Sinus tachycardia   . Tachycardia, unspecified   . Unspecified essential hypertension   . Unspecified fall   . Unspecified hypothyroidism   . Urinary tract infection, site not specified   . Viral meningitis     Family History  Problem Relation Age  of Onset  . Alzheimer's disease Mother 21    died May 22, 2006  . Mental illness Mother     dementia  . Depression Other   . Diabetes Other   . Cancer Other     breast, colon, kidney   Past Surgical History:  Procedure Laterality Date  . CARPAL TUNNEL RELEASE  May 22, 2003   bilateral  . cataract surgery both eyes  2011-05-22  . COLONOSCOPY  1991, 3.17.2008  . LAMINECTOMY  11.15.07   L3-L4  . NASAL SINUS SURGERY  May 22, 1998   Social History   Social History Narrative   Regular exercise - yes     Objective: Vital Signs: BP 113/65 (BP Location: Left Arm, Patient Position: Sitting, Cuff Size: Large)   Pulse 85   Resp 16   Ht 5\' 8"  (1.727 m)   Wt 190 lb (86.2 kg)   BMI 28.89 kg/m    Physical Exam  Constitutional: She is oriented to person, place, and time. She appears well-developed and well-nourished.  HENT:  Head: Normocephalic and atraumatic.  Eyes: Conjunctivae and EOM are normal.  Neck: Normal range of motion.  Cardiovascular: Normal rate, regular rhythm, normal heart sounds and intact distal pulses.   Pulmonary/Chest: Effort normal and breath sounds normal.  Abdominal: Soft. Bowel  sounds are normal.  Liver and spleen could not be palpated due to body habitus.  Lymphadenopathy:    She has no cervical adenopathy.  Neurological: She is alert and oriented to person, place, and time.  Skin: Skin is warm and dry. Capillary refill takes 2 to 3 seconds.  Psychiatric: She has a normal mood and affect. Her behavior is normal.  Nursing note and vitals reviewed.    Musculoskeletal Exam: She has limited lateral rotation of her C-spine, she has limited range of motion of her thoracic spine and lumbar spine. Shoulder joints, elbow joints, wrist joints, MCPs were good range of motion she has thickening of PIP/DIP and CMC joints consistent with osteoarthritis. Hip joints and knee joints with good range of motion she is no swelling or ankle joints. None of the joints were inflamed on examination  today.  CDAI Exam: No CDAI exam completed.    Investigation: Findings:  02/28/15 CBC normal, CMP GFR 55, Uric acid 6     Imaging: No results found.  Speciality Comments: No specialty comments available.    Procedures:  No procedures performed Allergies: Azithromycin; Codeine; and Motrin [ibuprofen]   Assessment / Plan: Visit Diagnoses:  Chronic gout without tophus, unspecified cause, unspecified site She has not had any flares since the last visit she's been taking Uloric a regular basis. She's taking long-term Uloric, we will check her labs today. We will call her with the results when available. Pseudogout: She denies any flare of her pseudogout.  DDD (degenerative disc disease), lumbar she continues to have some chronic persistent lower back pain. She has a handout on lower back exercises have advised her to continue with the exercises  Primary osteoarthritis of both hands: She has some discomfort but no swelling. Joint protection and muscle strengthening was discussed.  Primary osteoarthritis of both knees : Weight loss would be helpful to relieve some of the knee joint discomfort.  Obesity: Weight loss diet and exercise was discussed.  Ostroporosis she is on Fosamax and is monitored by her PCP.   Orders:CBC,CMP<Uric acid   Face-to-face time spent with patient was 58minutes. 50% of time was spent in counseling and coordination of care.  Follow-Up Instructions: Return in about 6 months (around 06/08/2016) for Gout.   Bo Merino, MD

## 2015-12-10 ENCOUNTER — Ambulatory Visit: Payer: Self-pay | Admitting: Rheumatology

## 2015-12-10 ENCOUNTER — Encounter: Payer: Self-pay | Admitting: Rheumatology

## 2015-12-10 ENCOUNTER — Ambulatory Visit (INDEPENDENT_AMBULATORY_CARE_PROVIDER_SITE_OTHER): Payer: Medicare Other | Admitting: Rheumatology

## 2015-12-10 VITALS — BP 113/65 | HR 85 | Resp 16 | Ht 68.0 in | Wt 190.0 lb

## 2015-12-10 DIAGNOSIS — M19042 Primary osteoarthritis, left hand: Secondary | ICD-10-CM

## 2015-12-10 DIAGNOSIS — M1120 Other chondrocalcinosis, unspecified site: Secondary | ICD-10-CM

## 2015-12-10 DIAGNOSIS — M170 Bilateral primary osteoarthritis of knee: Secondary | ICD-10-CM | POA: Diagnosis not present

## 2015-12-10 DIAGNOSIS — M1A9XX0 Chronic gout, unspecified, without tophus (tophi): Secondary | ICD-10-CM

## 2015-12-10 DIAGNOSIS — M19041 Primary osteoarthritis, right hand: Secondary | ICD-10-CM | POA: Diagnosis not present

## 2015-12-10 DIAGNOSIS — Z5181 Encounter for therapeutic drug level monitoring: Secondary | ICD-10-CM | POA: Diagnosis not present

## 2015-12-10 DIAGNOSIS — M5136 Other intervertebral disc degeneration, lumbar region: Secondary | ICD-10-CM | POA: Diagnosis not present

## 2015-12-10 DIAGNOSIS — M810 Age-related osteoporosis without current pathological fracture: Secondary | ICD-10-CM | POA: Diagnosis not present

## 2015-12-10 LAB — CBC WITH DIFFERENTIAL/PLATELET
BASOS ABS: 0 {cells}/uL (ref 0–200)
Basophils Relative: 0 %
Eosinophils Absolute: 148 cells/uL (ref 15–500)
Eosinophils Relative: 2 %
HEMATOCRIT: 40.5 % (ref 35.0–45.0)
HEMOGLOBIN: 13.9 g/dL (ref 11.7–15.5)
LYMPHS ABS: 2072 {cells}/uL (ref 850–3900)
Lymphocytes Relative: 28 %
MCH: 32 pg (ref 27.0–33.0)
MCHC: 34.3 g/dL (ref 32.0–36.0)
MCV: 93.1 fL (ref 80.0–100.0)
MONO ABS: 814 {cells}/uL (ref 200–950)
MPV: 9.8 fL (ref 7.5–12.5)
Monocytes Relative: 11 %
NEUTROS ABS: 4366 {cells}/uL (ref 1500–7800)
NEUTROS PCT: 59 %
Platelets: 291 10*3/uL (ref 140–400)
RBC: 4.35 MIL/uL (ref 3.80–5.10)
RDW: 13.6 % (ref 11.0–15.0)
WBC: 7.4 10*3/uL (ref 3.8–10.8)

## 2015-12-10 MED ORDER — FEBUXOSTAT 40 MG PO TABS: 40.0000 mg | ORAL_TABLET | Freq: Every day | ORAL | 5 refills | Status: DC

## 2015-12-11 ENCOUNTER — Telehealth: Payer: Self-pay | Admitting: Radiology

## 2015-12-11 LAB — COMPLETE METABOLIC PANEL WITH GFR
ALBUMIN: 4 g/dL (ref 3.6–5.1)
ALK PHOS: 62 U/L (ref 33–130)
ALT: 25 U/L (ref 6–29)
AST: 22 U/L (ref 10–35)
BILIRUBIN TOTAL: 0.3 mg/dL (ref 0.2–1.2)
BUN: 19 mg/dL (ref 7–25)
CALCIUM: 9.5 mg/dL (ref 8.6–10.4)
CO2: 30 mmol/L (ref 20–31)
Chloride: 104 mmol/L (ref 98–110)
Creat: 1.09 mg/dL — ABNORMAL HIGH (ref 0.60–0.93)
GFR, Est African American: 57 mL/min — ABNORMAL LOW (ref >=60)
GFR, Est Non African American: 49 mL/min — ABNORMAL LOW (ref >=60)
GLUCOSE: 142 mg/dL — AB (ref 65–99)
POTASSIUM: 4 mmol/L (ref 3.5–5.3)
Sodium: 144 mmol/L (ref 135–146)
TOTAL PROTEIN: 5.9 g/dL — AB (ref 6.1–8.1)

## 2015-12-11 LAB — URIC ACID: URIC ACID, SERUM: 5.5 mg/dL (ref 2.5–7.0)

## 2015-12-11 NOTE — Telephone Encounter (Signed)
I have called patient to advise labs are c/w previous left message for her daughter.

## 2016-01-07 DIAGNOSIS — R350 Frequency of micturition: Secondary | ICD-10-CM | POA: Diagnosis not present

## 2016-02-26 ENCOUNTER — Ambulatory Visit (INDEPENDENT_AMBULATORY_CARE_PROVIDER_SITE_OTHER): Payer: Self-pay | Admitting: Specialist

## 2016-03-05 ENCOUNTER — Ambulatory Visit (INDEPENDENT_AMBULATORY_CARE_PROVIDER_SITE_OTHER): Payer: Medicare Other | Admitting: Specialist

## 2016-03-24 ENCOUNTER — Other Ambulatory Visit: Payer: Self-pay | Admitting: Rheumatology

## 2016-03-24 ENCOUNTER — Telehealth: Payer: Self-pay | Admitting: Rheumatology

## 2016-03-24 NOTE — Telephone Encounter (Signed)
Called to schedule follow up and patient states she is having a hard time getting rides to her appointments so she will call back to schedule when she knows she will have a ride to bring her.

## 2016-03-24 NOTE — Telephone Encounter (Signed)
Last Visit: 12/10/15 Next Visit due April 2018. Message sent to the front to schedule patient.   Okay to refill Voltaren Gel?

## 2016-03-24 NOTE — Telephone Encounter (Signed)
-----   Message from Carole Binning, LPN sent at [id number]  9:53 AM EST ----- Regarding: Please schedule patient for follow up visit Please schedule patient for follow up visit. Patient due April 2018. Thanks!

## 2016-03-27 ENCOUNTER — Telehealth: Payer: Self-pay | Admitting: Pharmacist

## 2016-03-27 NOTE — Telephone Encounter (Signed)
Received fax from [place] stating patient's insurance will not pay for Uloric as it requires step therapy.  Completed PA for Uloric on Cover My Meds.  PA approved from 03/27/16 to 03/27/17.  I called patient and informed her and called [facility] and informed them of the approval.  Patient denies any questions or concerns at this time.    Elisabeth Most, Pharm.D., BCPS, CPP Clinical Pharmacist Pager: [phone] Phone: [phone] 03/27/2016 11:23 AM

## 2016-03-27 NOTE — Progress Notes (Signed)
Received a fax from [facility] regarding a prior authorization approval for Uloric from 03/27/16 to 03/27/17.   Reference number:none Phone number:[phone]   Will send document to scan center.  Kennen Stammer, [place], CPhT   1:01 PM

## 2016-05-22 DIAGNOSIS — L821 Other seborrheic keratosis: Secondary | ICD-10-CM | POA: Diagnosis not present

## 2016-06-16 ENCOUNTER — Other Ambulatory Visit: Payer: Self-pay | Admitting: Rheumatology

## 2016-06-16 NOTE — Telephone Encounter (Signed)
ok 

## 2016-06-16 NOTE — Telephone Encounter (Signed)
Last Visit: 12/10/15 Next Visit was due April 2018. Message sent to the front to schedule patient.  Labs: 12/10/15 C/W previous Patient advised she needs to update labs and schedule follow up visit. Patient is contacting her daughter who is her transportation and will call back to schedule.   Okay to refill 30 supply Uloric?

## 2016-06-25 NOTE — Progress Notes (Deleted)
Office Visit Note  Patient: [name]             Date of Birth: 04-Sep-1940           MRN: [id number]             PCP: Maurice Small, MD Referring: Maurice Small, MD Visit Date: 07/03/2016 Occupation: @GUAROCC @    Subjective:  No chief complaint on file.   History of Present Illness: [name] is a 76 y.o. female ***   Activities of Daily Living:  Patient reports morning stiffness for *** {minute/hour:19697}.   Patient {ACTIONS;DENIES/REPORTS:21021675::"Denies"} nocturnal pain.  Difficulty dressing/grooming: {ACTIONS;DENIES/REPORTS:21021675::"Denies"} Difficulty climbing stairs: {ACTIONS;DENIES/REPORTS:21021675::"Denies"} Difficulty getting out of chair: {ACTIONS;DENIES/REPORTS:21021675::"Denies"} Difficulty using hands for taps, buttons, cutlery, and/or writing: {ACTIONS;DENIES/REPORTS:21021675::"Denies"}   No Rheumatology ROS completed.   PMFS History:  Patient Active Problem List   Diagnosis Date Noted  . Osteoarthritis of both hands 12/09/2015  . Osteoarthritis of both knees 12/09/2015  . DDD (degenerative disc disease), lumbar 12/09/2015  . IBS (irritable bowel syndrome) 12/09/2015  . Anemia 12/09/2015  . Pseudogout 12/09/2015  . Obesity (BMI 30-39.9) 03/30/2013  . Persistent disorder of initiating or maintaining sleep 09/27/2012  . Neuroleptic-induced tardive dyskinesia 12/14/2011  . Gait disturbance 07/04/2010  . Bipolar depression ([place]) 07/04/2010  . Falling episodes 05/02/2010  . GENITAL HERPES 03/10/2010  . GERD 12/17/2009  . OTHER SPECIFIED INTESTINAL MALABSORPTION 12/17/2009  . OBSTRUCTIVE SLEEP APNEA 11/07/2009  . MEMORY LOSS 10/29/2009  . ALTERED MENTAL STATUS 09/20/2009  . TACHYCARDIA 09/20/2009  . SINUS TACHYCARDIA 08/19/2009  . RESTLESS LEG SYNDROME 08/07/2009  . CONCUSSION WITH NO LOSS OF CONSCIOUSNESS 07/26/2009  . DUODENITIS, WITHOUT HEMORRHAGE 07/18/2008  . HIATAL HERNIA WITH REFLUX 07/18/2008  . DIVERTICULOSIS, COLON  07/18/2008  . COLONIC POLYPS, HYPERPLASTIC, HX OF 07/18/2008  . ALLERGIC RHINITIS DUE TO POLLEN 05/02/2008  . Anxiety state, unspecified 12/22/2007  . DELIRIUM 12/22/2007  . VIRAL MENINGITIS, HX OF 12/22/2007  . DIABETES MELLITUS, TYPE II 08/26/2007  . BENIGN NEOPLASM OF EAR&EXTERNAL AUDITORY CANAL 03/21/2007  . Benign neoplasm of skin, site unspecified 03/21/2007  . HYPERLIPIDEMIA 03/21/2007  . Osteoporosis 03/14/2007  . SHINGLES 01/25/2007  . FASTING HYPERGLYCEMIA 07/30/2006  . HYPOTHYROIDISM 07/14/2006  . Gout 07/14/2006  . History of obsessive compulsive disorder 07/14/2006  . HYPERTENSION 07/14/2006  . LEG EDEMA, BILATERAL 07/14/2006    Past Medical History:  Diagnosis Date  . Abdominal pain, unspecified site   . Allergic rhinitis due to pollen   . Altered mental status   . Anemia 12/09/2015  . Anxiety   . Backache, unspecified   . 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 Benign neoplasm of skin, site unspecified   . Bilateral leg edema   . Circadian rhythm sleep disorder, delayed sleep phase type   . Concussion with no loss of consciousness   . DDD (degenerative disc disease), lumbar 12/09/2015  . Delirium   . Diaphragmatic hernia without mention of obstruction or gangrene   . Diarrhea   . Diverticulosis of colon (without mention of hemorrhage)   . DM II (diabetes mellitus, type II), controlled ([place])   . Duodenitis without mention of hemorrhage   . Esophageal reflux   . Family history of diabetes mellitus   . Family history of psychiatric condition   . Generalized osteoarthrosis, unspecified site   . Genital herpes, unspecified   . Gout, unspecified   . IBS (irritable bowel syndrome) 12/09/2015  . Impacted cerumen   .  Insomnia, unspecified   . Memory loss   . Mole (skin)   . Obsessive-compulsive disorders   . Obstructive sleep apnea (adult) (pediatric)     - PSG 09/05/09 AHI 14.2  . Osteoarthritis of both hands 12/09/2015  . Osteoarthritis of  both knees 12/09/2015  . Osteoporosis, unspecified   . Other abnormal blood chemistry   . Other and unspecified hyperlipidemia   . Other specified intestinal malabsorption   . Personal history of colonic polyps   . Restless legs syndrome (RLS)   . Routine general medical examination at a health care facility   . Shingles   . Shortness of breath   . Sinus tachycardia   . Tachycardia, unspecified   . Unspecified essential hypertension   . Unspecified fall   . Unspecified hypothyroidism   . Urinary tract infection, site not specified   . Viral meningitis     Family History  Problem Relation Age of Onset  . Alzheimer's disease Mother 28       died 2006/05/25  . Mental illness Mother        dementia  . Depression Other   . Diabetes Other   . Cancer Other        breast, colon, kidney   Past Surgical History:  Procedure Laterality Date  . CARPAL TUNNEL RELEASE  2003-05-25   bilateral  . cataract surgery both eyes  2011/05/25  . COLONOSCOPY  1991, 3.17.2008  . LAMINECTOMY  11.15.07   L3-L4  . NASAL SINUS SURGERY  1998/05/25   Social History   Social History Narrative   Regular exercise - yes     Objective: Vital Signs: There were no vitals taken for this visit.   Physical Exam   Musculoskeletal Exam: ***  CDAI Exam: No CDAI exam completed.    Investigation: No additional findings. CBC Latest Ref Rng & Units 12/10/2015 07/28/2015 03/01/2015  WBC 3.8 - 10.8 K/uL 7.4 10.0 6.0  Hemoglobin 11.7 - 15.5 g/dL 13.9 14.6 13.9  Hematocrit 35.0 - 45.0 % 40.5 42.8 41  Platelets 140 - 400 K/uL 291 279 267   CMP Latest Ref Rng & Units 12/10/2015 07/28/2015 03/01/2015  Glucose 65 - 99 mg/dL 142(H) 150(H) -  BUN 7 - 25 mg/dL 19 12 21   Creatinine 0.60 - 0.93 mg/dL 1.09(H) 1.04(H) 1.0  Sodium 135 - 146 mmol/L 144 140 140  Potassium 3.5 - 5.3 mmol/L 4.0 3.5 3.9  Chloride 98 - 110 mmol/L 104 104 -  CO2 20 - 31 mmol/L 30 26 -  Calcium 8.6 - 10.4 mg/dL 9.5 9.7 -  Total Protein 6.1 - 8.1 g/dL 5.9(L)  6.3(L) -  Total Bilirubin 0.2 - 1.2 mg/dL 0.3 0.3 -  Alkaline Phos 33 - 130 U/L 62 77 62  AST 10 - 35 U/L 22 27 18   ALT 6 - 29 U/L 25 28 21     Imaging: No results found.  Speciality Comments: No specialty comments available.    Procedures:  No procedures performed Allergies: Azithromycin; Codeine; and Motrin [ibuprofen]   Assessment / Plan:     Visit Diagnoses: Chronic gout without tophus, unspecified cause, unspecified site  Pseudogout  Primary osteoarthritis of both hands  Primary osteoarthritis of both knees  Age-related osteoporosis without current pathological fracture  History of obsessive compulsive disorder  Obesity (BMI 30-39.9)  History of IBS  Family history of tardive dyskinesia  History of sleep apnea  History of tachycardia  History of hypertension  History of hypothyroidism  RESTLESS LEG  SYNDROME  History of shingles  History of anemia    Orders: No orders of the defined types were placed in this encounter.  No orders of the defined types were placed in this encounter.   Face-to-face time spent with patient was *** minutes. 50% of time was spent in counseling and coordination of care.  Follow-Up Instructions: No Follow-up on file.   Amy Littrell, RT  Note - This record has been created using [employer].  Chart creation errors have been sought, but may not always  have been located. Such creation errors do not reflect on  the standard of medical care.

## 2016-07-03 ENCOUNTER — Ambulatory Visit: Payer: Medicare Other | Admitting: Rheumatology

## 2016-07-19 ENCOUNTER — Other Ambulatory Visit: Payer: Self-pay | Admitting: Rheumatology

## 2016-07-20 NOTE — Telephone Encounter (Signed)
Last Visit: 12/10/15 Next Visit: 08/06/16 Labs: 12/10/15 c/w previous labs  Okay to refill 30 day supply Uloric?

## 2016-08-06 ENCOUNTER — Ambulatory Visit: Payer: Medicare Other | Admitting: Rheumatology

## 2016-08-11 DIAGNOSIS — Z8739 Personal history of other diseases of the musculoskeletal system and connective tissue: Secondary | ICD-10-CM | POA: Insufficient documentation

## 2016-08-11 DIAGNOSIS — M170 Bilateral primary osteoarthritis of knee: Secondary | ICD-10-CM | POA: Insufficient documentation

## 2016-08-11 NOTE — Progress Notes (Deleted)
Office Visit Note  Patient: [name]             Date of Birth: 08/15/1940           MRN: [id number]             PCP: Maurice Small, MD Referring: Maurice Small, MD Visit Date: 08/17/2016 Occupation: @GUAROCC @    Subjective:  No chief complaint on file.   History of Present Illness: [name] is a 76 y.o. female ***   Activities of Daily Living:  Patient reports morning stiffness for *** {minute/hour:19697}.   Patient {ACTIONS;DENIES/REPORTS:21021675::"Denies"} nocturnal pain.  Difficulty dressing/grooming: {ACTIONS;DENIES/REPORTS:21021675::"Denies"} Difficulty climbing stairs: {ACTIONS;DENIES/REPORTS:21021675::"Denies"} Difficulty getting out of chair: {ACTIONS;DENIES/REPORTS:21021675::"Denies"} Difficulty using hands for taps, buttons, cutlery, and/or writing: {ACTIONS;DENIES/REPORTS:21021675::"Denies"}   No Rheumatology ROS completed.   PMFS History:  Patient Active Problem List   Diagnosis Date Noted  . ? calcium pyrophosphate deposition disease (CPPD) 08/11/2016  . Primary osteoarthritis of both knees 08/11/2016  . Osteoarthritis of both hands 12/09/2015  . Osteoarthritis of both knees 12/09/2015  . DDD (degenerative disc disease), lumbar 12/09/2015  . IBS (irritable bowel syndrome) 12/09/2015  . Anemia 12/09/2015  . Pseudogout 12/09/2015  . Obesity (BMI 30-39.9) 03/30/2013  . Persistent disorder of initiating or maintaining sleep 09/27/2012  . Neuroleptic-induced tardive dyskinesia 12/14/2011  . Gait disturbance 07/04/2010  . Bipolar depression ([place]) 07/04/2010  . Falling episodes 05/02/2010  . GENITAL HERPES 03/10/2010  . GERD 12/17/2009  . OTHER SPECIFIED INTESTINAL MALABSORPTION 12/17/2009  . OBSTRUCTIVE SLEEP APNEA 11/07/2009  . MEMORY LOSS 10/29/2009  . ALTERED MENTAL STATUS 09/20/2009  . TACHYCARDIA 09/20/2009  . SINUS TACHYCARDIA 08/19/2009  . RESTLESS LEG SYNDROME 08/07/2009  . CONCUSSION WITH NO LOSS OF CONSCIOUSNESS 07/26/2009  .  DUODENITIS, WITHOUT HEMORRHAGE 07/18/2008  . HIATAL HERNIA WITH REFLUX 07/18/2008  . DIVERTICULOSIS, COLON 07/18/2008  . COLONIC POLYPS, HYPERPLASTIC, HX OF 07/18/2008  . ALLERGIC RHINITIS DUE TO POLLEN 05/02/2008  . Anxiety state, unspecified 12/22/2007  . DELIRIUM 12/22/2007  . VIRAL MENINGITIS, HX OF 12/22/2007  . DIABETES MELLITUS, TYPE II 08/26/2007  . BENIGN NEOPLASM OF EAR&EXTERNAL AUDITORY CANAL 03/21/2007  . Benign neoplasm of skin, site unspecified 03/21/2007  . HYPERLIPIDEMIA 03/21/2007  . Osteoporosis 03/14/2007  . SHINGLES 01/25/2007  . FASTING HYPERGLYCEMIA 07/30/2006  . HYPOTHYROIDISM 07/14/2006  . Gout 07/14/2006  . History of obsessive compulsive disorder 07/14/2006  . HYPERTENSION 07/14/2006  . LEG EDEMA, BILATERAL 07/14/2006    Past Medical History:  Diagnosis Date  . Abdominal pain, unspecified site   . Allergic rhinitis due to pollen   . Altered mental status   . Anemia 12/09/2015  . Anxiety   . Backache, unspecified   . 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 Benign neoplasm of skin, site unspecified   . Bilateral leg edema   . Circadian rhythm sleep disorder, delayed sleep phase type   . Concussion with no loss of consciousness   . DDD (degenerative disc disease), lumbar 12/09/2015  . Delirium   . Diaphragmatic hernia without mention of obstruction or gangrene   . Diarrhea   . Diverticulosis of colon (without mention of hemorrhage)   . DM II (diabetes mellitus, type II), controlled ([place])   . Duodenitis without mention of hemorrhage   . Esophageal reflux   . Family history of diabetes mellitus   . Family history of psychiatric condition   . Generalized osteoarthrosis, unspecified site   . Genital herpes, unspecified   .  Gout, unspecified   . IBS (irritable bowel syndrome) 12/09/2015  . Impacted cerumen   . Insomnia, unspecified   . Memory loss   . Mole (skin)   . Obsessive-compulsive disorders   . Obstructive sleep apnea  (adult) (pediatric)     - PSG 09/05/09 AHI 14.2  . Osteoarthritis of both hands 12/09/2015  . Osteoarthritis of both knees 12/09/2015  . Osteoporosis, unspecified   . Other abnormal blood chemistry   . Other and unspecified hyperlipidemia   . Other specified intestinal malabsorption   . Personal history of colonic polyps   . Restless legs syndrome (RLS)   . Routine general medical examination at a health care facility   . Shingles   . Shortness of breath   . Sinus tachycardia   . Tachycardia, unspecified   . Unspecified essential hypertension   . Unspecified fall   . Unspecified hypothyroidism   . Urinary tract infection, site not specified   . Viral meningitis     Family History  Problem Relation Age of Onset  . Alzheimer's disease Mother 61       died 06-03-2006  . Mental illness Mother        dementia  . Depression Other   . Diabetes Other   . Cancer Other        breast, colon, kidney   Past Surgical History:  Procedure Laterality Date  . CARPAL TUNNEL RELEASE  06/03/2003   bilateral  . cataract surgery both eyes  06/03/11  . COLONOSCOPY  1991, 3.17.2008  . LAMINECTOMY  11.15.07   L3-L4  . NASAL SINUS SURGERY  06-03-1998   Social History   Social History Narrative   Regular exercise - yes     Objective: Vital Signs: There were no vitals taken for this visit.   Physical Exam   Musculoskeletal Exam: ***  CDAI Exam: No CDAI exam completed.    Investigation: No additional findings.   Imaging: No results found.  Speciality Comments: No specialty comments available. 12/10/2015 uric acid 5.5, CMP creatinine 1.09 GFR 49, CBC normal   Procedures:  No procedures performed Allergies: Azithromycin; Codeine; and Motrin [ibuprofen]   Assessment / Plan:     Visit Diagnoses: Idiopathic chronic gout of multiple sites without tophus - Uloric 40 mg by mouth daily  History of CPPD - On colchicine 0.6 mg by mouth daily when necessary  Primary osteoarthritis of both  hands  Primary osteoarthritis of both knees  DDD (degenerative disc disease), lumbar s/p Fusion   History of hypertension  Age-related osteoporosis without current pathological fracture - Followed up by PCP, on Fosamax 70 mg by mouth every week  History of diabetes mellitus  History of hyperlipidemia  History of asthma  History of hypothyroidism  History of IBS  History of anxiety  Falling episodes  History of sleep apnea    Orders: No orders of the defined types were placed in this encounter.  No orders of the defined types were placed in this encounter.   Face-to-face time spent with patient was *** minutes. 50% of time was spent in counseling and coordination of care.  Follow-Up Instructions: No Follow-up on file.   Bo Merino, MD  Note - This record has been created using [employer].  Chart creation errors have been sought, but may not always  have been located. Such creation errors do not reflect on  the standard of medical care.

## 2016-08-17 ENCOUNTER — Ambulatory Visit: Payer: Medicare Other | Admitting: Rheumatology

## 2016-08-21 ENCOUNTER — Other Ambulatory Visit: Payer: Self-pay | Admitting: Rheumatology

## 2016-08-21 NOTE — Telephone Encounter (Signed)
Last Visit: 12/10/15 Next Visit was due April 2018. Message sent to the front to schedule patient.  Labs: 12/10/15 c/w previous   Okay to refill 30 day supply Uloric?

## 2016-08-25 ENCOUNTER — Other Ambulatory Visit: Payer: Self-pay | Admitting: *Deleted

## 2016-08-25 MED ORDER — FEBUXOSTAT 40 MG PO TABS: 40.0000 mg | ORAL_TABLET | Freq: Every day | ORAL | 0 refills | Status: DC

## 2016-08-25 NOTE — Telephone Encounter (Signed)
Refill request received via fax  Last Visit: 12/09/16 Next Visit: 09/24/16 Labs: 12/09/16 c/w previous labs  Attempted to contact patient to remind her she is due for labs.  Okay to refill 30 supply Uloric?

## 2016-08-25 NOTE — Telephone Encounter (Signed)
ok 

## 2016-09-10 ENCOUNTER — Ambulatory Visit (INDEPENDENT_AMBULATORY_CARE_PROVIDER_SITE_OTHER): Payer: Medicare Other | Admitting: Specialist

## 2016-09-17 NOTE — Progress Notes (Deleted)
Office Visit Note  Patient: [name]             Date of Birth: 03/28/1940           MRN: [id number]             PCP: Maurice Small, MD Referring: Maurice Small, MD Visit Date: 09/24/2016 Occupation: @GUAROCC @    Subjective:  No chief complaint on file.   History of Present Illness: [name] is a 76 y.o. female ***   Activities of Daily Living:  Patient reports morning stiffness for *** {minute/hour:19697}.   Patient {ACTIONS;DENIES/REPORTS:21021675::"Denies"} nocturnal pain.  Difficulty dressing/grooming: {ACTIONS;DENIES/REPORTS:21021675::"Denies"} Difficulty climbing stairs: {ACTIONS;DENIES/REPORTS:21021675::"Denies"} Difficulty getting out of chair: {ACTIONS;DENIES/REPORTS:21021675::"Denies"} Difficulty using hands for taps, buttons, cutlery, and/or writing: {ACTIONS;DENIES/REPORTS:21021675::"Denies"}   No Rheumatology ROS completed.   PMFS History:  Patient Active Problem List   Diagnosis Date Noted  . ? calcium pyrophosphate deposition disease (CPPD) 08/11/2016  . Primary osteoarthritis of both knees 08/11/2016  . Osteoarthritis of both hands 12/09/2015  . Osteoarthritis of both knees 12/09/2015  . DDD (degenerative disc disease), lumbar 12/09/2015  . IBS (irritable bowel syndrome) 12/09/2015  . Anemia 12/09/2015  . Pseudogout 12/09/2015  . Obesity (BMI 30-39.9) 03/30/2013  . Persistent disorder of initiating or maintaining sleep 09/27/2012  . Neuroleptic-induced tardive dyskinesia 12/14/2011  . Gait disturbance 07/04/2010  . Bipolar depression ([place]) 07/04/2010  . Falling episodes 05/02/2010  . GENITAL HERPES 03/10/2010  . GERD 12/17/2009  . OTHER SPECIFIED INTESTINAL MALABSORPTION 12/17/2009  . OBSTRUCTIVE SLEEP APNEA 11/07/2009  . MEMORY LOSS 10/29/2009  . ALTERED MENTAL STATUS 09/20/2009  . TACHYCARDIA 09/20/2009  . SINUS TACHYCARDIA 08/19/2009  . RESTLESS LEG SYNDROME 08/07/2009  . CONCUSSION WITH NO LOSS OF CONSCIOUSNESS 07/26/2009  .  DUODENITIS, WITHOUT HEMORRHAGE 07/18/2008  . HIATAL HERNIA WITH REFLUX 07/18/2008  . DIVERTICULOSIS, COLON 07/18/2008  . COLONIC POLYPS, HYPERPLASTIC, HX OF 07/18/2008  . ALLERGIC RHINITIS DUE TO POLLEN 05/02/2008  . Anxiety state, unspecified 12/22/2007  . DELIRIUM 12/22/2007  . VIRAL MENINGITIS, HX OF 12/22/2007  . DIABETES MELLITUS, TYPE II 08/26/2007  . BENIGN NEOPLASM OF EAR&EXTERNAL AUDITORY CANAL 03/21/2007  . Benign neoplasm of skin, site unspecified 03/21/2007  . HYPERLIPIDEMIA 03/21/2007  . Osteoporosis 03/14/2007  . SHINGLES 01/25/2007  . FASTING HYPERGLYCEMIA 07/30/2006  . HYPOTHYROIDISM 07/14/2006  . Gout 07/14/2006  . History of obsessive compulsive disorder 07/14/2006  . HYPERTENSION 07/14/2006  . LEG EDEMA, BILATERAL 07/14/2006    Past Medical History:  Diagnosis Date  . Abdominal pain, unspecified site   . Allergic rhinitis due to pollen   . Altered mental status   . Anemia 12/09/2015  . Anxiety   . Backache, unspecified   . 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 Benign neoplasm of skin, site unspecified   . Bilateral leg edema   . Circadian rhythm sleep disorder, delayed sleep phase type   . Concussion with no loss of consciousness   . DDD (degenerative disc disease), lumbar 12/09/2015  . Delirium   . Diaphragmatic hernia without mention of obstruction or gangrene   . Diarrhea   . Diverticulosis of colon (without mention of hemorrhage)   . DM II (diabetes mellitus, type II), controlled ([place])   . Duodenitis without mention of hemorrhage   . Esophageal reflux   . Family history of diabetes mellitus   . Family history of psychiatric condition   . Generalized osteoarthrosis, unspecified site   . Genital herpes, unspecified   .  Gout, unspecified   . IBS (irritable bowel syndrome) 12/09/2015  . Impacted cerumen   . Insomnia, unspecified   . Memory loss   . Mole (skin)   . Obsessive-compulsive disorders   . Obstructive sleep apnea  (adult) (pediatric)     - PSG 09/05/09 AHI 14.2  . Osteoarthritis of both hands 12/09/2015  . Osteoarthritis of both knees 12/09/2015  . Osteoporosis, unspecified   . Other abnormal blood chemistry   . Other and unspecified hyperlipidemia   . Other specified intestinal malabsorption   . Personal history of colonic polyps   . Restless legs syndrome (RLS)   . Routine general medical examination at a health care facility   . Shingles   . Shortness of breath   . Sinus tachycardia   . Tachycardia, unspecified   . Unspecified essential hypertension   . Unspecified fall   . Unspecified hypothyroidism   . Urinary tract infection, site not specified   . Viral meningitis     Family History  Problem Relation Age of Onset  . Alzheimer's disease Mother 51       died 11-May-2006  . Mental illness Mother        dementia  . Depression Other   . Diabetes Other   . Cancer Other        breast, colon, kidney   Past Surgical History:  Procedure Laterality Date  . CARPAL TUNNEL RELEASE  05-11-2003   bilateral  . cataract surgery both eyes  11-May-2011  . COLONOSCOPY  1991, 3.17.2008  . LAMINECTOMY  11.15.07   L3-L4  . NASAL SINUS SURGERY  05-11-98   Social History   Social History Narrative   Regular exercise - yes     Objective: Vital Signs: There were no vitals taken for this visit.   Physical Exam   Musculoskeletal Exam: ***  CDAI Exam: No CDAI exam completed.    Investigation: No additional findings. CBC Latest Ref Rng & Units 12/10/2015 07/28/2015 03/01/2015  WBC 3.8 - 10.8 K/uL 7.4 10.0 6.0  Hemoglobin 11.7 - 15.5 g/dL 13.9 14.6 13.9  Hematocrit 35.0 - 45.0 % 40.5 42.8 41  Platelets 140 - 400 K/uL 291 279 267    CMP Latest Ref Rng & Units 12/10/2015 07/28/2015 03/01/2015  Glucose 65 - 99 mg/dL 142(H) 150(H) -  BUN 7 - 25 mg/dL 19 12 21   Creatinine 0.60 - 0.93 mg/dL 1.09(H) 1.04(H) 1.0  Sodium 135 - 146 mmol/L 144 140 140  Potassium 3.5 - 5.3 mmol/L 4.0 3.5 3.9  Chloride 98 - 110 mmol/L  104 104 -  CO2 20 - 31 mmol/L 30 26 -  Calcium 8.6 - 10.4 mg/dL 9.5 9.7 -  Total Protein 6.1 - 8.1 g/dL 5.9(L) 6.3(L) -  Total Bilirubin 0.2 - 1.2 mg/dL 0.3 0.3 -  Alkaline Phos 33 - 130 U/L 62 77 62  AST 10 - 35 U/L 22 27 18   ALT 6 - 29 U/L 25 28 21   Uric acid 5.5  Imaging: No results found.  Speciality Comments: No specialty comments available.    Procedures:  No procedures performed Allergies: Azithromycin; Codeine; and Motrin [ibuprofen]   Assessment / Plan:     Visit Diagnoses: Idiopathic chronic gout of multiple sites without tophus  ? calcium pyrophosphate deposition disease (CPPD)  Age-related osteoporosis without current pathological fracture  DDD (degenerative disc disease), lumbar  Primary osteoarthritis of both hands  Primary osteoarthritis of both knees  History of obsessive compulsive disorder  - Anxiety/  and Bipolar disorder   History of anemia  History of IBS  History of gastroesophageal reflux (GERD)    Orders: No orders of the defined types were placed in this encounter.  No orders of the defined types were placed in this encounter.   Face-to-face time spent with patient was *** minutes. 50% of time was spent in counseling and coordination of care.  Follow-Up Instructions: No Follow-up on file.   Bo Merino, MD  Note - This record has been created using [employer].  Chart creation errors have been sought, but may not always  have been located. Such creation errors do not reflect on  the standard of medical care.

## 2016-09-24 ENCOUNTER — Ambulatory Visit: Payer: Medicare Other | Admitting: Rheumatology

## 2016-09-26 ENCOUNTER — Other Ambulatory Visit (INDEPENDENT_AMBULATORY_CARE_PROVIDER_SITE_OTHER): Payer: Self-pay | Admitting: Specialist

## 2016-09-28 NOTE — Telephone Encounter (Signed)
Meloxicam refill request--Patient has not been seen since 10/24/2015 by Dr. Louanne Skye

## 2016-10-31 ENCOUNTER — Other Ambulatory Visit: Payer: Self-pay | Admitting: Rheumatology

## 2016-11-02 NOTE — Telephone Encounter (Addendum)
Last Visit: 12/10/15 Next Visit was due April 2018. Patient has no showed 2 appointments.  Labs: 12/10/15 c/w previous  Do you want to refill this prescription?

## 2016-11-06 NOTE — Telephone Encounter (Signed)
Advised patient that she would need to come in for a follow up in order to get her refill of Uloric. Patient was then compliant with making an appt.

## 2016-11-06 NOTE — Telephone Encounter (Signed)
Called patient to schedule follow up appointment and patient stated "It is hard for me to get there so I am going to decline."

## 2016-11-06 NOTE — Telephone Encounter (Signed)
Sch appt with Gilmer Mor on Wed. Ok to give 30 d supply.

## 2016-11-11 ENCOUNTER — Encounter: Payer: Self-pay | Admitting: Rheumatology

## 2016-11-11 ENCOUNTER — Ambulatory Visit (INDEPENDENT_AMBULATORY_CARE_PROVIDER_SITE_OTHER): Payer: Medicare Other | Admitting: Rheumatology

## 2016-11-11 VITALS — BP 112/77 | HR 86 | Resp 18 | Ht 68.0 in | Wt 187.0 lb

## 2016-11-11 DIAGNOSIS — M170 Bilateral primary osteoarthritis of knee: Secondary | ICD-10-CM

## 2016-11-11 DIAGNOSIS — M810 Age-related osteoporosis without current pathological fracture: Secondary | ICD-10-CM | POA: Diagnosis not present

## 2016-11-11 DIAGNOSIS — M19042 Primary osteoarthritis, left hand: Secondary | ICD-10-CM | POA: Diagnosis not present

## 2016-11-11 DIAGNOSIS — M5136 Other intervertebral disc degeneration, lumbar region: Secondary | ICD-10-CM

## 2016-11-11 DIAGNOSIS — M1120 Other chondrocalcinosis, unspecified site: Secondary | ICD-10-CM | POA: Diagnosis not present

## 2016-11-11 DIAGNOSIS — M19041 Primary osteoarthritis, right hand: Secondary | ICD-10-CM | POA: Diagnosis not present

## 2016-11-11 DIAGNOSIS — M1A9XX0 Chronic gout, unspecified, without tophus (tophi): Secondary | ICD-10-CM

## 2016-11-11 MED ORDER — FEBUXOSTAT 40 MG PO TABS: 40.0000 mg | ORAL_TABLET | Freq: Every day | ORAL | 5 refills | Status: AC

## 2016-11-11 MED ORDER — COLCHICINE 0.6 MG PO TABS: 0.6000 mg | ORAL_TABLET | Freq: Every day | ORAL | 1 refills | Status: DC

## 2016-11-11 NOTE — Progress Notes (Signed)
Office Visit Note  Patient: [name]             Date of Birth: [birth year]           MRN: [id number]             PCP: Maurice Small, MD Referring: Maurice Small, MD Visit Date: 11/11/2016 Occupation: @GUAROCC @    Subjective:  Osteoarthritis (Back pain)   History of Present Illness: [name] is a 76 y.o. female  Patient states that she does not have any gout flare since the last visit. She is taking her Uloric 40 mg as prescribed.She is taking colchicine but did not know it by that name. The colchicine has been prescribed for her pseudogout.  Today, patient is complaining of her lumbar back pain. She sees Dr. Benjiman Core for this. He ordered imaging studies for her back about a year ago. She does plan to follow up with him.  She does have osteoporosis and is getting Fosamax from her PCP, Dr. Garnet Koyanagi. She plans to follow with her. I do not see a bone density reports in [place] or in Epic. Dr. Etter Sjogren works at [facility] and we are unable to see the records from [facility].  She has no other complaints except request medicines to be refilledand she needs her labs updated.  Activities of Daily Living:  Patient reports morning stiffness for 30 minutes.   Patient Denies nocturnal pain.  Difficulty dressing/grooming: Denies Difficulty climbing stairs: Denies Difficulty getting out of chair: Denies Difficulty using hands for taps, buttons, cutlery, and/or writing: Denies   No Rheumatology ROS completed.   PMFS History:  Patient Active Problem List   Diagnosis Date Noted  . ? calcium pyrophosphate deposition disease (CPPD) 08/11/2016  . Primary osteoarthritis of both knees 08/11/2016  . Osteoarthritis of both hands 12/09/2015  . Osteoarthritis of both knees 12/09/2015  . DDD (degenerative disc disease), lumbar 12/09/2015  . IBS (irritable bowel syndrome) 12/09/2015  . Anemia 12/09/2015  . Pseudogout 12/09/2015  . Obesity (BMI 30-39.9) 03/30/2013  .  Persistent disorder of initiating or maintaining sleep 09/27/2012  . Neuroleptic-induced tardive dyskinesia 12/14/2011  . Gait disturbance 07/04/2010  . Bipolar depression ([place]) 07/04/2010  . Falling episodes 05/02/2010  . GENITAL HERPES 03/10/2010  . GERD 12/17/2009  . OTHER SPECIFIED INTESTINAL MALABSORPTION 12/17/2009  . OBSTRUCTIVE SLEEP APNEA 11/07/2009  . MEMORY LOSS 10/29/2009  . ALTERED MENTAL STATUS 09/20/2009  . TACHYCARDIA 09/20/2009  . SINUS TACHYCARDIA 08/19/2009  . RESTLESS LEG SYNDROME 08/07/2009  . CONCUSSION WITH NO LOSS OF CONSCIOUSNESS 07/26/2009  . DUODENITIS, WITHOUT HEMORRHAGE 07/18/2008  . HIATAL HERNIA WITH REFLUX 07/18/2008  . DIVERTICULOSIS, COLON 07/18/2008  . COLONIC POLYPS, HYPERPLASTIC, HX OF 07/18/2008  . ALLERGIC RHINITIS DUE TO POLLEN 05/02/2008  . Anxiety state, unspecified 12/22/2007  . DELIRIUM 12/22/2007  . VIRAL MENINGITIS, HX OF 12/22/2007  . DIABETES MELLITUS, TYPE II 08/26/2007  . BENIGN NEOPLASM OF EAR&EXTERNAL AUDITORY CANAL 03/21/2007  . Benign neoplasm of skin, site unspecified 03/21/2007  . HYPERLIPIDEMIA 03/21/2007  . Osteoporosis 03/14/2007  . SHINGLES 01/25/2007  . FASTING HYPERGLYCEMIA 07/30/2006  . HYPOTHYROIDISM 07/14/2006  . Gout 07/14/2006  . History of obsessive compulsive disorder 07/14/2006  . HYPERTENSION 07/14/2006  . LEG EDEMA, BILATERAL 07/14/2006    Past Medical History:  Diagnosis Date  . Abdominal pain, unspecified site   . Allergic rhinitis due to pollen   . Altered mental status   . Anemia 12/09/2015  . Anxiety   .  Backache, unspecified   . 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 Benign neoplasm of skin, site unspecified   . Bilateral leg edema   . Circadian rhythm sleep disorder, delayed sleep phase type   . Concussion with no loss of consciousness   . DDD (degenerative disc disease), lumbar 12/09/2015  . Delirium   . Diaphragmatic hernia without mention of obstruction or gangrene   .  Diarrhea   . Diverticulosis of colon (without mention of hemorrhage)   . DM II (diabetes mellitus, type II), controlled ([place])   . Duodenitis without mention of hemorrhage   . Esophageal reflux   . Family history of diabetes mellitus   . Family history of psychiatric condition   . Generalized osteoarthrosis, unspecified site   . Genital herpes, unspecified   . Gout, unspecified   . IBS (irritable bowel syndrome) 12/09/2015  . Impacted cerumen   . Insomnia, unspecified   . Memory loss   . Mole (skin)   . Obsessive-compulsive disorders   . Obstructive sleep apnea (adult) (pediatric)     - PSG 09/05/09 AHI 14.2  . Osteoarthritis of both hands 12/09/2015  . Osteoarthritis of both knees 12/09/2015  . Osteoporosis, unspecified   . Other abnormal blood chemistry   . Other and unspecified hyperlipidemia   . Other specified intestinal malabsorption   . Personal history of colonic polyps   . Restless legs syndrome (RLS)   . Routine general medical examination at a health care facility   . Shingles   . Shortness of breath   . Sinus tachycardia   . Tachycardia, unspecified   . Unspecified essential hypertension   . Unspecified fall   . Unspecified hypothyroidism   . Urinary tract infection, site not specified   . Viral meningitis     Family History  Problem Relation Age of Onset  . Alzheimer's disease Mother 60       died 04-18-06  . Mental illness Mother        dementia  . Depression Other   . Diabetes Other   . Cancer Other        breast, colon, kidney   Past Surgical History:  Procedure Laterality Date  . CARPAL TUNNEL RELEASE  04/19/03   bilateral  . cataract surgery both eyes  19-Apr-2011  . COLONOSCOPY  1991, 3.17.2008  . LAMINECTOMY  11.15.07   L3-L4  . NASAL SINUS SURGERY  04-18-1998   Social History   Social History Narrative   Regular exercise - yes     Objective: Vital Signs: BP 112/77 (BP Location: Left Arm, Patient Position: Sitting, Cuff Size: Normal)   Pulse 86   Resp  18   Ht 5' 8"  (1.727 m)   Wt 187 lb (84.8 kg)   BMI 28.43 kg/m    Physical Exam   Musculoskeletal Exam:  Full range of motion of all joints Grip strength is equal and strong bilaterally Fibromyalgia tender points are absent  CDAI Exam: No CDAI exam completed.  No synovitis on examination  Investigation: No additional findings. No visits with results within 6 Month(s) from this visit.  Latest known visit with results is:  Office Visit on 12/10/2015  Component Date Value Ref Range Status  . WBC 12/10/2015 7.4  3.8 - 10.8 K/uL Final  . RBC 12/10/2015 4.35  3.80 - 5.10 MIL/uL Final  . Hemoglobin 12/10/2015 13.9  11.7 - 15.5 g/dL Final  . HCT 12/10/2015 40.5  35.0 - 45.0 %  Final  . MCV 12/10/2015 93.1  80.0 - 100.0 fL Final  . MCH 12/10/2015 32.0  27.0 - 33.0 pg Final  . MCHC 12/10/2015 34.3  32.0 - 36.0 g/dL Final  . RDW 12/10/2015 13.6  11.0 - 15.0 % Final  . Platelets 12/10/2015 291  140 - 400 K/uL Final  . MPV 12/10/2015 9.8  7.5 - 12.5 fL Final  . Neutro Abs 12/10/2015 4366  1,500 - 7,800 cells/uL Final  . Lymphs Abs 12/10/2015 2072  850 - 3,900 cells/uL Final  . Monocytes Absolute 12/10/2015 814  200 - 950 cells/uL Final  . Eosinophils Absolute 12/10/2015 148  15 - 500 cells/uL Final  . Basophils Absolute 12/10/2015 0  0 - 200 cells/uL Final  . Neutrophils Relative % 12/10/2015 59  % Final  . Lymphocytes Relative 12/10/2015 28  % Final  . Monocytes Relative 12/10/2015 11  % Final  . Eosinophils Relative 12/10/2015 2  % Final  . Basophils Relative 12/10/2015 0  % Final  . Smear Review 12/10/2015 Criteria for review not met   Final  . Sodium 12/10/2015 144  135 - 146 mmol/L Final  . Potassium 12/10/2015 4.0  3.5 - 5.3 mmol/L Final  . Chloride 12/10/2015 104  98 - 110 mmol/L Final  . CO2 12/10/2015 30  20 - 31 mmol/L Final  . Glucose, Bld 12/10/2015 142* 65 - 99 mg/dL Final  . BUN 12/10/2015 19  7 - 25 mg/dL Final  . Creat 12/10/2015 1.09* 0.60 - 0.93 mg/dL Final    Comment:   For patients > or = 78 years of age: The upper reference limit for Creatinine is approximately 13% higher for people identified as African-American.     . Total Bilirubin 12/10/2015 0.3  0.2 - 1.2 mg/dL Final  . Alkaline Phosphatase 12/10/2015 62  33 - 130 U/L Final  . AST 12/10/2015 22  10 - 35 U/L Final  . ALT 12/10/2015 25  6 - 29 U/L Final  . Total Protein 12/10/2015 5.9* 6.1 - 8.1 g/dL Final  . Albumin 12/10/2015 4.0  3.6 - 5.1 g/dL Final  . Calcium 12/10/2015 9.5  8.6 - 10.4 mg/dL Final  . GFR, Est African American 12/10/2015 57* >=60 mL/min Final  . GFR, Est Non African American 12/10/2015 49* >=60 mL/min Final  . Uric Acid, Serum 12/10/2015 5.5  2.5 - 7.0 mg/dL Final     Imaging: No results found.  Speciality Comments: No specialty comments available.    Procedures:  No procedures performed Allergies: Azithromycin; Codeine; and Motrin [ibuprofen]   Assessment / Plan:     Visit Diagnoses: Chronic gout without tophus, unspecified cause, unspecified site - Plan: CBC with Differential/Platelet, COMPLETE METABOLIC PANEL WITH GFR, Uric acid  Pseudogout - Plan: CBC with Differential/Platelet, COMPLETE METABOLIC PANEL WITH GFR  DDD (degenerative disc disease), lumbar  Primary osteoarthritis of both hands  Primary osteoarthritis of both knees  Osteoporosis, unspecified osteoporosis type, unspecified pathological fracture presence   Plan: #1: Gout. No flare. Taking Uloric 40 mg daily. No complaint. Patient can afford her medication just fine.  #2: Pseudogout. No flare. Taking COLCHICINE DAILY Patient did not know hat culture stain was being used for pseudogout.  #3:low back pain. Seeing Dr. Benjiman Core for this.he ordered a imaging studies for her back about a year ago. Please review Epic  #4:osteoporosis. Patient is on Fosamax. She sees her PCP for this. We do not have any records of her last bone density. I reviewed SRS  as well as  Epic.  #5: Return to clinic in 6 months  Orders: Orders Placed This Encounter  Procedures  . CBC with Differential/Platelet  . COMPLETE METABOLIC PANEL WITH GFR  . Uric acid   Meds ordered this encounter  Medications  . colchicine 0.6 MG tablet    Sig: Take 1 tablet (0.6 mg total) by mouth daily. For gout    Dispense:  90 tablet    Refill:  1    Order Specific Question:   Supervising Provider    Answer:   Bo Merino [[id number]]  . febuxostat (ULORIC) 40 MG tablet    Sig: Take 1 tablet (40 mg total) by mouth daily.    Dispense:  30 tablet    Refill:  5    Order Specific Question:   Supervising Provider    Answer:   Bo Merino [phone]    Face-to-face time spent with patient was 30 minutes. 50% of time was spent in counseling and coordination of care.  Follow-Up Instructions: Return in about 6 months (around 05/12/2017) for gout // pseudogout // oporosis -pcp // lbp - james owens //.   Eliezer Lofts, PA-C  Note - This record has been created using Editor, commissioning.  Chart creation errors have been sought, but may not always  have been located. Such creation errors do not reflect on  the standard of medical care.

## 2016-11-12 LAB — CBC WITH DIFFERENTIAL/PLATELET
BASOS PCT: 0.6 %
Basophils Absolute: 50 cells/uL (ref 0–200)
EOS ABS: 202 {cells}/uL (ref 15–500)
Eosinophils Relative: 2.4 %
HCT: 43.2 % (ref 35.0–45.0)
HEMOGLOBIN: 14.8 g/dL (ref 11.7–15.5)
Lymphs Abs: 2932 cells/uL (ref 850–3900)
MCH: 31.8 pg (ref 27.0–33.0)
MCHC: 34.3 g/dL (ref 32.0–36.0)
MCV: 92.9 fL (ref 80.0–100.0)
MONOS PCT: 7.4 %
MPV: 10.4 fL (ref 7.5–12.5)
NEUTROS ABS: 4595 {cells}/uL (ref 1500–7800)
Neutrophils Relative %: 54.7 %
Platelets: 320 10*3/uL (ref 140–400)
RBC: 4.65 10*6/uL (ref 3.80–5.10)
RDW: 12.7 % (ref 11.0–15.0)
Total Lymphocyte: 34.9 %
WBC: 8.4 10*3/uL (ref 3.8–10.8)
WBCMIX: 622 {cells}/uL (ref 200–950)

## 2016-11-12 LAB — COMPLETE METABOLIC PANEL WITH GFR
AG Ratio: 2.4 (calc) (ref 1.0–2.5)
ALBUMIN MSPROF: 4.5 g/dL (ref 3.6–5.1)
ALT: 18 U/L (ref 6–29)
AST: 16 U/L (ref 10–35)
Alkaline phosphatase (APISO): 66 U/L (ref 33–130)
BILIRUBIN TOTAL: 0.6 mg/dL (ref 0.2–1.2)
BUN / CREAT RATIO: 21 (calc) (ref 6–22)
BUN: 20 mg/dL (ref 7–25)
CHLORIDE: 101 mmol/L (ref 98–110)
CO2: 32 mmol/L (ref 20–32)
Calcium: 9.6 mg/dL (ref 8.6–10.4)
Creat: 0.96 mg/dL — ABNORMAL HIGH (ref 0.60–0.93)
GFR, EST AFRICAN AMERICAN: 66 mL/min/{1.73_m2} (ref >=60)
GFR, EST NON AFRICAN AMERICAN: 57 mL/min/{1.73_m2} — AB (ref >=60)
GLUCOSE: 127 mg/dL — AB (ref 65–99)
Globulin: 1.9 g/dL (calc) (ref 1.9–3.7)
Potassium: 3.3 mmol/L — ABNORMAL LOW (ref 3.5–5.3)
Sodium: 142 mmol/L (ref 135–146)
TOTAL PROTEIN: 6.4 g/dL (ref 6.1–8.1)

## 2016-11-12 LAB — URIC ACID: Uric Acid, Serum: 4.7 mg/dL (ref 2.5–7.0)

## 2016-11-19 DIAGNOSIS — Z23 Encounter for immunization: Secondary | ICD-10-CM | POA: Diagnosis not present

## 2016-12-24 ENCOUNTER — Ambulatory Visit (INDEPENDENT_AMBULATORY_CARE_PROVIDER_SITE_OTHER): Payer: Medicare Other | Admitting: Specialist

## 2017-01-04 ENCOUNTER — Encounter (INDEPENDENT_AMBULATORY_CARE_PROVIDER_SITE_OTHER): Payer: Self-pay | Admitting: Specialist

## 2017-01-04 ENCOUNTER — Ambulatory Visit (INDEPENDENT_AMBULATORY_CARE_PROVIDER_SITE_OTHER): Payer: Medicare Other | Admitting: Specialist

## 2017-01-04 ENCOUNTER — Ambulatory Visit (INDEPENDENT_AMBULATORY_CARE_PROVIDER_SITE_OTHER): Payer: Self-pay

## 2017-01-04 VITALS — BP 137/82 | HR 77 | Ht 66.0 in | Wt 187.0 lb

## 2017-01-04 DIAGNOSIS — M4156 Other secondary scoliosis, lumbar region: Secondary | ICD-10-CM | POA: Diagnosis not present

## 2017-01-04 DIAGNOSIS — M48062 Spinal stenosis, lumbar region with neurogenic claudication: Secondary | ICD-10-CM

## 2017-01-04 DIAGNOSIS — M545 Low back pain: Secondary | ICD-10-CM

## 2017-01-04 NOTE — Progress Notes (Signed)
Office Visit Note   Patient: [name]           Date of Birth: 06/20/1940           MRN: [id number] Visit Date: 01/04/2017              Requested by: Maurice Small, MD [place],  [id number] PCP: Maurice Small, MD   Assessment & Plan: Visit Diagnoses:  1. Low back pain, unspecified back pain laterality, unspecified chronicity, with sciatica presence unspecified   2. Other secondary scoliosis, lumbar region   3. Spinal stenosis of lumbar region with neurogenic claudication     Plan: Avoid bending, stooping and avoid lifting weights greater than 10 lbs. Avoid prolong standing and walking. Avoid frequent bending and stooping  No lifting greater than 10 lbs. May use ice or moist heat for pain. Weight loss is of benefit. Handicap license is approved. Dr. Romona Curls secretary/Assistant will call to arrange for epidural steroid injection   Follow-Up Instructions: No Follow-up on file.   Orders:  Orders Placed This Encounter  Procedures  . XR Lumbar Spine 2-3 Views   No orders of the defined types were placed in this encounter.     Procedures: No procedures performed   Clinical Data: No additional findings.   Subjective: Chief Complaint  Patient presents with  . Lower Back - Pain    HPI  Review of Systems   Objective: Vital Signs: BP 137/82 (BP Location: Left Arm, Patient Position: Sitting)   Pulse 77   Ht 5\' 6"  (1.676 m)   Wt 187 lb (84.8 kg)   BMI 30.18 kg/m   Physical Exam  Ortho Exam  Specialty Comments:  No specialty comments available.  Imaging: No results found.   PMFS History: Patient Active Problem List   Diagnosis Date Noted  . ? calcium pyrophosphate deposition disease (CPPD) 08/11/2016  . Primary osteoarthritis of both knees 08/11/2016  . Osteoarthritis of both hands 12/09/2015  . Osteoarthritis of both knees 12/09/2015  . DDD (degenerative disc disease), lumbar 12/09/2015  . IBS (irritable  bowel syndrome) 12/09/2015  . Anemia 12/09/2015  . Pseudogout 12/09/2015  . Obesity (BMI 30-39.9) 03/30/2013  . Persistent disorder of initiating or maintaining sleep 09/27/2012  . Neuroleptic-induced tardive dyskinesia 12/14/2011  . Gait disturbance 07/04/2010  . Bipolar depression ([place]) 07/04/2010  . Falling episodes 05/02/2010  . GENITAL HERPES 03/10/2010  . GERD 12/17/2009  . OTHER SPECIFIED INTESTINAL MALABSORPTION 12/17/2009  . OBSTRUCTIVE SLEEP APNEA 11/07/2009  . MEMORY LOSS 10/29/2009  . ALTERED MENTAL STATUS 09/20/2009  . TACHYCARDIA 09/20/2009  . SINUS TACHYCARDIA 08/19/2009  . RESTLESS LEG SYNDROME 08/07/2009  . CONCUSSION WITH NO LOSS OF CONSCIOUSNESS 07/26/2009  . DUODENITIS, WITHOUT HEMORRHAGE 07/18/2008  . HIATAL HERNIA WITH REFLUX 07/18/2008  . DIVERTICULOSIS, COLON 07/18/2008  . COLONIC POLYPS, HYPERPLASTIC, HX OF 07/18/2008  . ALLERGIC RHINITIS DUE TO POLLEN 05/02/2008  . Anxiety state, unspecified 12/22/2007  . DELIRIUM 12/22/2007  . VIRAL MENINGITIS, HX OF 12/22/2007  . DIABETES MELLITUS, TYPE II 08/26/2007  . BENIGN NEOPLASM OF EAR&EXTERNAL AUDITORY CANAL 03/21/2007  . Benign neoplasm of skin, site unspecified 03/21/2007  . HYPERLIPIDEMIA 03/21/2007  . Osteoporosis 03/14/2007  . SHINGLES 01/25/2007  . FASTING HYPERGLYCEMIA 07/30/2006  . HYPOTHYROIDISM 07/14/2006  . Gout 07/14/2006  . History of obsessive compulsive disorder 07/14/2006  . HYPERTENSION 07/14/2006  . LEG EDEMA, BILATERAL 07/14/2006   Past Medical History:  Diagnosis Date  . Abdominal pain, unspecified  site   . Allergic rhinitis due to pollen   . Altered mental status   . Anemia 12/09/2015  . Anxiety   . Backache, unspecified   . 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 Benign neoplasm of skin, site unspecified   . Bilateral leg edema   . Circadian rhythm sleep disorder, delayed sleep phase type   . Concussion with no loss of consciousness   . DDD (degenerative disc  disease), lumbar 12/09/2015  . Delirium   . Diaphragmatic hernia without mention of obstruction or gangrene   . Diarrhea   . Diverticulosis of colon (without mention of hemorrhage)   . DM II (diabetes mellitus, type II), controlled ([place])   . Duodenitis without mention of hemorrhage   . Esophageal reflux   . Family history of diabetes mellitus   . Family history of psychiatric condition   . Generalized osteoarthrosis, unspecified site   . Genital herpes, unspecified   . Gout, unspecified   . IBS (irritable bowel syndrome) 12/09/2015  . Impacted cerumen   . Insomnia, unspecified   . Memory loss   . Mole (skin)   . Obsessive-compulsive disorders   . Obstructive sleep apnea (adult) (pediatric)     - PSG 09/05/09 AHI 14.2  . Osteoarthritis of both hands 12/09/2015  . Osteoarthritis of both knees 12/09/2015  . Osteoporosis, unspecified   . Other abnormal blood chemistry   . Other and unspecified hyperlipidemia   . Other specified intestinal malabsorption   . Personal history of colonic polyps   . Restless legs syndrome (RLS)   . Routine general medical examination at a health care facility   . Shingles   . Shortness of breath   . Sinus tachycardia   . Tachycardia, unspecified   . Unspecified essential hypertension   . Unspecified fall   . Unspecified hypothyroidism   . Urinary tract infection, site not specified   . Viral meningitis     Family History  Problem Relation Age of Onset  . Alzheimer's disease Mother 80       died 05/10/2006  . Mental illness Mother        dementia  . Depression Other   . Diabetes Other   . Cancer Other        breast, colon, kidney    Past Surgical History:  Procedure Laterality Date  . CARPAL TUNNEL RELEASE  05/10/03   bilateral  . cataract surgery both eyes  05/10/2011  . COLONOSCOPY  1991, 3.17.2008  . LAMINECTOMY  11.15.07   L3-L4  . NASAL SINUS SURGERY  05/10/98   Social History   Occupational History  . Occupation: retired    Fish farm manager: RETIRED      Comment: IT sales professional  Tobacco Use  . Smoking status: Former Smoker    Packs/day: 1.00    Years: 4.00    Pack years: 4.00    Types: Cigarettes    Last attempt to quit: 04/16/1970    Years since quitting: 46.7  . Smokeless tobacco: Never Used  Substance and Sexual Activity  . Alcohol use: No  . Drug use: No  . Sexual activity: Not on file

## 2017-01-04 NOTE — Patient Instructions (Signed)
Avoid bending, stooping and avoid lifting weights greater than 10 lbs. Avoid prolong standing and walking. Avoid frequent bending and stooping  No lifting greater than 10 lbs. May use ice or moist heat for pain. Weight loss is of benefit. Handicap license is approved. Dr. Newton's secretary/Assistant will call to arrange for epidural steroid injection  

## 2017-01-08 ENCOUNTER — Encounter: Payer: Self-pay | Admitting: Neurology

## 2017-01-08 DIAGNOSIS — Z1211 Encounter for screening for malignant neoplasm of colon: Secondary | ICD-10-CM | POA: Diagnosis not present

## 2017-01-08 DIAGNOSIS — I10 Essential (primary) hypertension: Secondary | ICD-10-CM | POA: Diagnosis not present

## 2017-01-08 DIAGNOSIS — R413 Other amnesia: Secondary | ICD-10-CM | POA: Diagnosis not present

## 2017-01-08 DIAGNOSIS — Z7984 Long term (current) use of oral hypoglycemic drugs: Secondary | ICD-10-CM | POA: Diagnosis not present

## 2017-01-08 DIAGNOSIS — R399 Unspecified symptoms and signs involving the genitourinary system: Secondary | ICD-10-CM | POA: Diagnosis not present

## 2017-01-08 DIAGNOSIS — E119 Type 2 diabetes mellitus without complications: Secondary | ICD-10-CM | POA: Diagnosis not present

## 2017-01-08 DIAGNOSIS — E039 Hypothyroidism, unspecified: Secondary | ICD-10-CM | POA: Diagnosis not present

## 2017-01-08 DIAGNOSIS — E538 Deficiency of other specified B group vitamins: Secondary | ICD-10-CM | POA: Diagnosis not present

## 2017-01-19 ENCOUNTER — Encounter (INDEPENDENT_AMBULATORY_CARE_PROVIDER_SITE_OTHER): Payer: Self-pay | Admitting: Physical Medicine and Rehabilitation

## 2017-01-19 ENCOUNTER — Ambulatory Visit (INDEPENDENT_AMBULATORY_CARE_PROVIDER_SITE_OTHER): Payer: Medicare Other

## 2017-01-19 ENCOUNTER — Encounter (INDEPENDENT_AMBULATORY_CARE_PROVIDER_SITE_OTHER): Payer: Medicare Other | Admitting: Physical Medicine and Rehabilitation

## 2017-01-19 ENCOUNTER — Ambulatory Visit (INDEPENDENT_AMBULATORY_CARE_PROVIDER_SITE_OTHER): Payer: Medicare Other | Admitting: Physical Medicine and Rehabilitation

## 2017-01-19 VITALS — BP 123/80 | HR 112 | Temp 98.6°F

## 2017-01-19 DIAGNOSIS — M961 Postlaminectomy syndrome, not elsewhere classified: Secondary | ICD-10-CM

## 2017-01-19 DIAGNOSIS — G894 Chronic pain syndrome: Secondary | ICD-10-CM

## 2017-01-19 DIAGNOSIS — M5416 Radiculopathy, lumbar region: Secondary | ICD-10-CM | POA: Diagnosis not present

## 2017-01-19 MED ORDER — LIDOCAINE HCL (PF) 1 % IJ SOLN
2.0000 mL | Freq: Once | INTRAMUSCULAR | Status: AC
  Administered 2017-01-19: 2 mL

## 2017-01-19 MED ORDER — BETAMETHASONE SOD PHOS & ACET 6 (3-3) MG/ML IJ SUSP
12.0000 mg | Freq: Once | INTRAMUSCULAR | Status: AC
  Administered 2017-01-19: 12 mg

## 2017-01-19 NOTE — Patient Instructions (Signed)

## 2017-01-19 NOTE — Progress Notes (Signed)
C/O Pain in her low back.  State she has the same constant pain.  +driver, - BT's, - Dye allergy.

## 2017-01-20 NOTE — Procedures (Signed)
[name] is a pleasant 76 year old female with complicated medical history with significant complications of bipolar disorder and some altered mental status and possibly with diabetes type 2 prior history of lumbar surgery.  She has recently seen Dr. Louanne Skye who requested a right L5 transforaminal injection diagnostic medically.  She has some pain in the low back and buttock region and lateral hip.  Nothing really down the leg.  No paresthesia.  Shewill follow-up with Dr. Louanne Skye.  Lumbosacral Transforaminal Epidural Steroid Injection - Sub-Pedicular Approach with Fluoroscopic Guidance  Patient: [name]      Date of Birth: September 26, 1940 MRN: [id number] PCP: Maurice Small, MD      Visit Date: 01/19/2017   Universal Protocol:    Date/Time: 01/19/2017  Consent Given By: the patient  Position: PRONE  Additional Comments: Vital signs were monitored before and after the procedure. Patient was prepped and draped in the usual sterile fashion. The correct patient, procedure, and site was verified.   Injection Procedure Details:  Procedure Site One Meds Administered:  Meds ordered this encounter  Medications  . lidocaine (PF) (XYLOCAINE) 1 % injection 2 mL  . betamethasone acetate-betamethasone sodium phosphate (CELESTONE) injection 12 mg    Laterality: Right  Location/Site:  L5-S1  Needle size: 22 G  Needle type: Spinal  Needle Placement: Transforaminal  Findings:    -Comments: Excellent flow of contrast along the nerve and into the epidural space.  Procedure Details: After squaring off the end-plates to get a true AP view, the C-arm was positioned so that an oblique view of the foramen as noted above was visualized. The target area is just inferior to the "nose of the scotty dog" or sub pedicular. The soft tissues overlying this structure were infiltrated with 2-3 ml. of 1% Lidocaine without Epinephrine.  The spinal needle was inserted toward the target using a  "trajectory" view along the fluoroscope beam.  Under AP and lateral visualization, the needle was advanced so it did not puncture dura and was located close the 6 O'Clock position of the pedical in AP tracterory. Biplanar projections were used to confirm position. Aspiration was confirmed to be negative for CSF and/or blood. A 1-2 ml. volume of Isovue-250 was injected and flow of contrast was noted at each level. Radiographs were obtained for documentation purposes.   After attaining the desired flow of contrast documented above, a 0.5 to 1.0 ml test dose of 0.25% Marcaine was injected into each respective transforaminal space.  The patient was observed for 90 seconds post injection.  After no sensory deficits were reported, and normal lower extremity motor function was noted,   the above injectate was administered so that equal amounts of the injectate were placed at each foramen (level) into the transforaminal epidural space.   Additional Comments:  The patient tolerated the procedure well Dressing: Band-Aid    Post-procedure details: Patient was observed during the procedure. Post-procedure instructions were reviewed.  Patient left the clinic in stable condition.   Pertinent Imaging: MRI LUMBAR SPINE WITHOUT CONTRAST  TECHNIQUE: Multiplanar, multisequence MR imaging of the lumbar spine was performed. No intravenous contrast was administered.  COMPARISON:  Lumbar CT myelogram 11/12/2005  FINDINGS: Segmentation:  Normal.  Alignment: Mild lower lumbar levoscoliosis which appears increased compared to 2007. Grade 1 anterolisthesis of L4 on L5 measures 6 mm, stable to minimally increased.  Vertebrae: No evidence of fracture. Progressive, severe disc degeneration from L2-3 to L5-S1 with severe disc space narrowing and marked degenerative endplate changes. Mild degenerative  endplate edema is present at L2-3 and L3-4.  Conus medullaris: Extends to the L1-2 level and appears  normal.  Paraspinal and other soft tissues: Unremarkable aside from postoperative changes.  Disc levels:  L1-2: New minimal disc bulging and mild facet and ligamentum flavum hypertrophy without stenosis.  L2-3: Circumferential disc bulging, severe ligamentum flavum thickening, moderate facet arthrosis, and prominent dorsal epidural fat result in new moderate to severe spinal stenosis and severe right neural foraminal stenosis.  L3-4: Interval laminectomies. Suspected mild epidural fibrosis in the left lateral recess about the L4 nerve roots. Circumferential disc bulging and moderate right and advanced left facet arthrosis result in mild left lateral recess stenosis, mild residual transverse narrowing of the spinal canal, and moderate right greater than left neural foraminal stenosis. Neural foraminal narrowing has progressed.  L4-5: Interval laminectomies. Listhesis with asymmetric left-sided disc uncovering and advanced facet arthrosis result in mild residual left lateral recess stenosis and moderate residual left neural foraminal stenosis. No significant residual spinal stenosis.  L5-S1: Circumferential disc bulging, endplate spurring, disc space height loss, and moderate facet and ligamentum flavum hypertrophy result in mild bilateral lateral recess and moderate bilateral neural foraminal stenosis, not significantly changed. No spinal stenosis.  IMPRESSION: 1. Progressive, severe disc degeneration from L2-3 to L4-5. Interval laminectomies at L3-4 and L4-5. 2. New moderate to severe spinal stenosis and severe right neural foraminal stenosis at L2-3. 3. Progressive moderate bilateral foraminal stenosis at L3-4. 4. Unchanged moderate bilateral foraminal stenosis at L5-S1.   Electronically Signed   By: Logan Bores M.D.   On: 10/09/2015 17:24

## 2017-02-15 ENCOUNTER — Encounter (INDEPENDENT_AMBULATORY_CARE_PROVIDER_SITE_OTHER): Payer: Self-pay | Admitting: Specialist

## 2017-02-15 ENCOUNTER — Ambulatory Visit (INDEPENDENT_AMBULATORY_CARE_PROVIDER_SITE_OTHER): Payer: Medicare Other | Admitting: Specialist

## 2017-02-15 VITALS — BP 131/74 | HR 76 | Ht 66.25 in | Wt 200.0 lb

## 2017-02-15 DIAGNOSIS — M533 Sacrococcygeal disorders, not elsewhere classified: Secondary | ICD-10-CM

## 2017-02-15 DIAGNOSIS — G8929 Other chronic pain: Secondary | ICD-10-CM | POA: Diagnosis not present

## 2017-02-15 DIAGNOSIS — M48062 Spinal stenosis, lumbar region with neurogenic claudication: Secondary | ICD-10-CM | POA: Diagnosis not present

## 2017-02-15 NOTE — Progress Notes (Addendum)
Office Visit Note   Patient: [name]           Date of Birth: 12/18/1940           MRN: [id number] Visit Date: 02/15/2017              Requested by: Maurice Small, MD [place] [place], [place] [address] PCP: Maurice Small, MD   Assessment & Plan: Visit Diagnoses:  1. Spinal stenosis of lumbar region with neurogenic claudication   2. Chronic left SI joint pain     Plan: We will schedule patient to have diagnostic/therapeutic left SI joint injection with Dr. Ernestina Patches.  Follow-up in the office about 3 weeks for recheck to see how she is feeling.  Advised patient to pay close attention to how she feels immediately after the injection. Follow-Up Instructions: Return in about 3 weeks (around 03/08/2017) for recheck after Left SI joint injection. .   Orders:  Orders Placed This Encounter  Procedures  . Ambulatory referral to Physical Medicine Rehab   No orders of the defined types were placed in this encounter.     Procedures: No procedures performed   Clinical Data: No additional findings.   Subjective: Chief Complaint  Patient presents with  . Lower Back - Follow-up    Had Right L5-S1 Transformin injection with Dr. Ernestina Patches on 01/19/2017    HPI Patient returns after having right L5-S1 transforaminal ESI with Dr. Ernestina Patches January 19, 2017.  States that she did notice some improvement temporarily and is complaining mostly of pain around the left SI joint.  Not having any lower extremity radiculopathy. Review of Systems  Constitutional: Negative.   Respiratory: Negative.   Gastrointestinal: Negative.   Musculoskeletal: Positive for back pain and gait problem.     Objective: Vital Signs: BP 131/74 (BP Location: Left Arm, Patient Position: Sitting)   Pulse 76   Ht 5' 6.25" (1.683 m)   Wt 200 lb (90.7 kg)   BMI 32.04 kg/m   Physical Exam  Constitutional: No distress.  HENT:  Head: Normocephalic and atraumatic.  Eyes: EOM are normal.  Pupils are equal, round, and reactive to light.  Pulmonary/Chest: No respiratory distress.  Musculoskeletal:  Marked TTP left SI joint.  NVI.  Negative straight leg raise.  Neurological: She is alert.  Skin: Skin is warm and dry.    Ortho Exam  Specialty Comments:  No specialty comments available.  Imaging: No results found.   PMFS History: Patient Active Problem List   Diagnosis Date Noted  . ? calcium pyrophosphate deposition disease (CPPD) 08/11/2016  . Primary osteoarthritis of both knees 08/11/2016  . Osteoarthritis of both hands 12/09/2015  . Osteoarthritis of both knees 12/09/2015  . DDD (degenerative disc disease), lumbar 12/09/2015  . IBS (irritable bowel syndrome) 12/09/2015  . Anemia 12/09/2015  . Pseudogout 12/09/2015  . Obesity (BMI 30-39.9) 03/30/2013  . Persistent disorder of initiating or maintaining sleep 09/27/2012  . Neuroleptic-induced tardive dyskinesia 12/14/2011  . Gait disturbance 07/04/2010  . Bipolar depression ([place]) 07/04/2010  . Falling episodes 05/02/2010  . GENITAL HERPES 03/10/2010  . GERD 12/17/2009  . OTHER SPECIFIED INTESTINAL MALABSORPTION 12/17/2009  . OBSTRUCTIVE SLEEP APNEA 11/07/2009  . MEMORY LOSS 10/29/2009  . ALTERED MENTAL STATUS 09/20/2009  . TACHYCARDIA 09/20/2009  . SINUS TACHYCARDIA 08/19/2009  . RESTLESS LEG SYNDROME 08/07/2009  . CONCUSSION WITH NO LOSS OF CONSCIOUSNESS 07/26/2009  . DUODENITIS, WITHOUT HEMORRHAGE 07/18/2008  . HIATAL HERNIA WITH REFLUX 07/18/2008  .  DIVERTICULOSIS, COLON 07/18/2008  . COLONIC POLYPS, HYPERPLASTIC, HX OF 07/18/2008  . ALLERGIC RHINITIS DUE TO POLLEN 05/02/2008  . Anxiety state, unspecified 12/22/2007  . DELIRIUM 12/22/2007  . VIRAL MENINGITIS, HX OF 12/22/2007  . DIABETES MELLITUS, TYPE II 08/26/2007  . BENIGN NEOPLASM OF EAR&EXTERNAL AUDITORY CANAL 03/21/2007  . Benign neoplasm of skin, site unspecified 03/21/2007  . HYPERLIPIDEMIA 03/21/2007  . Osteoporosis 03/14/2007  .  SHINGLES 01/25/2007  . FASTING HYPERGLYCEMIA 07/30/2006  . HYPOTHYROIDISM 07/14/2006  . Gout 07/14/2006  . History of obsessive compulsive disorder 07/14/2006  . HYPERTENSION 07/14/2006  . LEG EDEMA, BILATERAL 07/14/2006   Past Medical History:  Diagnosis Date  . Abdominal pain, unspecified site   . Allergic rhinitis due to pollen   . Altered mental status   . Anemia 12/09/2015  . Anxiety   . Backache, unspecified   . 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 Benign neoplasm of skin, site unspecified   . Bilateral leg edema   . Circadian rhythm sleep disorder, delayed sleep phase type   . Concussion with no loss of consciousness   . DDD (degenerative disc disease), lumbar 12/09/2015  . Delirium   . Diaphragmatic hernia without mention of obstruction or gangrene   . Diarrhea   . Diverticulosis of colon (without mention of hemorrhage)   . DM II (diabetes mellitus, type II), controlled ([place])   . Duodenitis without mention of hemorrhage   . Esophageal reflux   . Family history of diabetes mellitus   . Family history of psychiatric condition   . Generalized osteoarthrosis, unspecified site   . Genital herpes, unspecified   . Gout, unspecified   . IBS (irritable bowel syndrome) 12/09/2015  . Impacted cerumen   . Insomnia, unspecified   . Memory loss   . Mole (skin)   . Obsessive-compulsive disorders   . Obstructive sleep apnea (adult) (pediatric)     - PSG 09/05/09 AHI 14.2  . Osteoarthritis of both hands 12/09/2015  . Osteoarthritis of both knees 12/09/2015  . Osteoporosis, unspecified   . Other abnormal blood chemistry   . Other and unspecified hyperlipidemia   . Other specified intestinal malabsorption   . Personal history of colonic polyps   . Restless legs syndrome (RLS)   . Routine general medical examination at a health care facility   . Shingles   . Shortness of breath   . Sinus tachycardia   . Tachycardia, unspecified   . Unspecified essential  hypertension   . Unspecified fall   . Unspecified hypothyroidism   . Urinary tract infection, site not specified   . Viral meningitis     Family History  Problem Relation Age of Onset  . Alzheimer's disease Mother 36       died 2006-06-02  . Mental illness Mother        dementia  . Depression Other   . Diabetes Other   . Cancer Other        breast, colon, kidney    Past Surgical History:  Procedure Laterality Date  . CARPAL TUNNEL RELEASE  02-Jun-2003   bilateral  . cataract surgery both eyes  June 02, 2011  . COLONOSCOPY  1991, 3.17.2008  . LAMINECTOMY  11.15.07   L3-L4  . NASAL SINUS SURGERY  02-Jun-1998   Social History   Occupational History  . 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 Smoking status: Former Smoker  Packs/day: 1.00    Years: 4.00    Pack years: 4.00    Types: Cigarettes    Last attempt to quit: 04/16/1970    Years since quitting: 46.8  . Smokeless tobacco: Never Used  Substance and Sexual Activity  . Alcohol use: No  . Drug use: No  . Sexual activity: Not on file

## 2017-02-26 ENCOUNTER — Ambulatory Visit (INDEPENDENT_AMBULATORY_CARE_PROVIDER_SITE_OTHER): Payer: Medicare Other | Admitting: Physical Medicine and Rehabilitation

## 2017-03-05 NOTE — Progress Notes (Deleted)
This note needs completion by Jeneen Rinks. jen

## 2017-03-16 ENCOUNTER — Ambulatory Visit (INDEPENDENT_AMBULATORY_CARE_PROVIDER_SITE_OTHER): Payer: Medicare Other | Admitting: Physical Medicine and Rehabilitation

## 2017-03-29 ENCOUNTER — Ambulatory Visit: Payer: Medicare Other | Admitting: Neurology

## 2017-04-01 ENCOUNTER — Ambulatory Visit (INDEPENDENT_AMBULATORY_CARE_PROVIDER_SITE_OTHER): Payer: Medicare Other | Admitting: Physical Medicine and Rehabilitation

## 2017-04-03 DIAGNOSIS — E119 Type 2 diabetes mellitus without complications: Secondary | ICD-10-CM | POA: Diagnosis not present

## 2017-04-03 DIAGNOSIS — Z79899 Other long term (current) drug therapy: Secondary | ICD-10-CM | POA: Diagnosis not present

## 2017-04-03 DIAGNOSIS — A499 Bacterial infection, unspecified: Secondary | ICD-10-CM | POA: Diagnosis not present

## 2017-04-03 DIAGNOSIS — R300 Dysuria: Secondary | ICD-10-CM | POA: Diagnosis not present

## 2017-04-03 DIAGNOSIS — N390 Urinary tract infection, site not specified: Secondary | ICD-10-CM | POA: Diagnosis not present

## 2017-04-03 DIAGNOSIS — R509 Fever, unspecified: Secondary | ICD-10-CM | POA: Diagnosis not present

## 2017-04-12 ENCOUNTER — Ambulatory Visit (INDEPENDENT_AMBULATORY_CARE_PROVIDER_SITE_OTHER): Payer: Self-pay | Admitting: Physical Medicine and Rehabilitation

## 2017-04-14 DIAGNOSIS — R300 Dysuria: Secondary | ICD-10-CM | POA: Diagnosis not present

## 2017-04-14 DIAGNOSIS — E039 Hypothyroidism, unspecified: Secondary | ICD-10-CM | POA: Diagnosis not present

## 2017-04-14 DIAGNOSIS — I10 Essential (primary) hypertension: Secondary | ICD-10-CM | POA: Diagnosis not present

## 2017-04-14 DIAGNOSIS — E119 Type 2 diabetes mellitus without complications: Secondary | ICD-10-CM | POA: Diagnosis not present

## 2017-04-15 ENCOUNTER — Ambulatory Visit (INDEPENDENT_AMBULATORY_CARE_PROVIDER_SITE_OTHER): Payer: Self-pay | Admitting: Physical Medicine and Rehabilitation

## 2017-04-27 ENCOUNTER — Other Ambulatory Visit: Payer: Self-pay

## 2017-04-27 ENCOUNTER — Ambulatory Visit (INDEPENDENT_AMBULATORY_CARE_PROVIDER_SITE_OTHER): Payer: Medicare Other | Admitting: Neurology

## 2017-04-27 ENCOUNTER — Encounter: Payer: Self-pay | Admitting: Neurology

## 2017-04-27 VITALS — BP 118/64 | HR 102 | Ht 68.0 in | Wt 185.0 lb

## 2017-04-27 DIAGNOSIS — F0390 Unspecified dementia without behavioral disturbance: Secondary | ICD-10-CM | POA: Diagnosis not present

## 2017-04-27 DIAGNOSIS — F03A0 Unspecified dementia, mild, without behavioral disturbance, psychotic disturbance, mood disturbance, and anxiety: Secondary | ICD-10-CM

## 2017-04-27 MED ORDER — MEMANTINE HCL 10 MG PO TABS: 10.0000 mg | ORAL_TABLET | Freq: Two times a day (BID) | ORAL | 6 refills | Status: AC
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
## 2017-04-27 NOTE — Patient Instructions (Addendum)
1. Schedule MRI brain without contrast  We have sent a referral to [facility] for your MRI and they will call you directly to schedule your appt. They are located at [place]. If you need to contact them directly please call [phone].   2. Start Namenda 10mg  twice a day 3. Recommend getting more help at home with administering medications daily 4. We will get you set up with [facility] through the [employer] to help with local resources and support 5. Follow-up in 6 months, call for any changes

## 2017-04-29 ENCOUNTER — Telehealth: Payer: Self-pay | Admitting: Neurology

## 2017-04-29 NOTE — Telephone Encounter (Signed)
Returned call.  Someone other than [place] answered.  He asked if he could help me, however neither of [place] know why [name] was calling me.  The gentleman stated that he would have [name] return my call.

## 2017-04-29 NOTE — Telephone Encounter (Signed)
Mariann Laster with [facility] left a VM message asking for a call back from Dr Aquino's nurse, did not say the reason why just regarding pt

## 2017-05-06 ENCOUNTER — Ambulatory Visit (INDEPENDENT_AMBULATORY_CARE_PROVIDER_SITE_OTHER): Payer: Self-pay | Admitting: Physical Medicine and Rehabilitation

## 2017-05-07 ENCOUNTER — Encounter: Payer: Self-pay | Admitting: Neurology

## 2017-05-10 ENCOUNTER — Ambulatory Visit
Admission: RE | Admit: 2017-05-10 | Discharge: 2017-05-10 | Disposition: A | Discharge status: Home / Self Care | Payer: Medicare Other | Source: Ambulatory Visit | Attending: Neurology | Admitting: Neurology

## 2017-05-10 DIAGNOSIS — F419 Anxiety disorder, unspecified: Secondary | ICD-10-CM | POA: Diagnosis not present

## 2017-05-10 DIAGNOSIS — M170 Bilateral primary osteoarthritis of knee: Secondary | ICD-10-CM | POA: Diagnosis not present

## 2017-05-10 DIAGNOSIS — F0390 Unspecified dementia without behavioral disturbance: Secondary | ICD-10-CM | POA: Diagnosis not present

## 2017-05-10 DIAGNOSIS — F03A0 Unspecified dementia, mild, without behavioral disturbance, psychotic disturbance, mood disturbance, and anxiety: Secondary | ICD-10-CM

## 2017-05-10 DIAGNOSIS — M5136 Other intervertebral disc degeneration, lumbar region: Secondary | ICD-10-CM | POA: Diagnosis not present

## 2017-05-10 DIAGNOSIS — Z7982 Long term (current) use of aspirin: Secondary | ICD-10-CM | POA: Diagnosis not present

## 2017-05-10 DIAGNOSIS — F329 Major depressive disorder, single episode, unspecified: Secondary | ICD-10-CM | POA: Diagnosis not present

## 2017-05-10 DIAGNOSIS — M48061 Spinal stenosis, lumbar region without neurogenic claudication: Secondary | ICD-10-CM | POA: Diagnosis not present

## 2017-05-10 DIAGNOSIS — Z7984 Long term (current) use of oral hypoglycemic drugs: Secondary | ICD-10-CM | POA: Diagnosis not present

## 2017-05-10 DIAGNOSIS — E119 Type 2 diabetes mellitus without complications: Secondary | ICD-10-CM | POA: Diagnosis not present

## 2017-05-11 ENCOUNTER — Telehealth: Payer: Self-pay

## 2017-05-11 DIAGNOSIS — E119 Type 2 diabetes mellitus without complications: Secondary | ICD-10-CM | POA: Diagnosis not present

## 2017-05-11 DIAGNOSIS — E039 Hypothyroidism, unspecified: Secondary | ICD-10-CM | POA: Diagnosis not present

## 2017-05-11 DIAGNOSIS — I10 Essential (primary) hypertension: Secondary | ICD-10-CM | POA: Diagnosis not present

## 2017-05-11 NOTE — Telephone Encounter (Signed)
LMOM relaying message below.  

## 2017-05-11 NOTE — Telephone Encounter (Signed)
-----   Message from Cameron Sprang, MD sent at 05/10/2017  2:28 PM EDT ----- Pls let daughter know the MRI brain did not show any evidence of tumor, stroke, or bleed. It showed age-related changes. Thanks

## 2017-05-13 ENCOUNTER — Other Ambulatory Visit: Payer: Self-pay | Admitting: *Deleted

## 2017-05-13 DIAGNOSIS — E119 Type 2 diabetes mellitus without complications: Secondary | ICD-10-CM | POA: Diagnosis not present

## 2017-05-13 DIAGNOSIS — F0390 Unspecified dementia without behavioral disturbance: Secondary | ICD-10-CM | POA: Diagnosis not present

## 2017-05-13 DIAGNOSIS — M48061 Spinal stenosis, lumbar region without neurogenic claudication: Secondary | ICD-10-CM | POA: Diagnosis not present

## 2017-05-13 DIAGNOSIS — F329 Major depressive disorder, single episode, unspecified: Secondary | ICD-10-CM | POA: Diagnosis not present

## 2017-05-13 DIAGNOSIS — M5136 Other intervertebral disc degeneration, lumbar region: Secondary | ICD-10-CM | POA: Diagnosis not present

## 2017-05-13 DIAGNOSIS — M170 Bilateral primary osteoarthritis of knee: Secondary | ICD-10-CM | POA: Diagnosis not present

## 2017-05-13 MED ORDER — COLCHICINE 0.6 MG PO TABS: 0.6000 mg | ORAL_TABLET | Freq: Every day | ORAL | 0 refills | Status: DC

## 2017-05-13 NOTE — Telephone Encounter (Signed)
Last Visit: 11/11/16 Next visit: 11/04/17 Labs: 11/11/16 slight decrease in GFR at 57 and slight increase in creatinine at 0.96  Okay to refill 30 day supply per Dr. Estanislado Pandy

## 2017-05-18 DIAGNOSIS — M48061 Spinal stenosis, lumbar region without neurogenic claudication: Secondary | ICD-10-CM | POA: Diagnosis not present

## 2017-05-18 DIAGNOSIS — M5136 Other intervertebral disc degeneration, lumbar region: Secondary | ICD-10-CM | POA: Diagnosis not present

## 2017-05-18 DIAGNOSIS — M170 Bilateral primary osteoarthritis of knee: Secondary | ICD-10-CM | POA: Diagnosis not present

## 2017-05-18 DIAGNOSIS — F329 Major depressive disorder, single episode, unspecified: Secondary | ICD-10-CM | POA: Diagnosis not present

## 2017-05-18 DIAGNOSIS — F0390 Unspecified dementia without behavioral disturbance: Secondary | ICD-10-CM | POA: Diagnosis not present

## 2017-05-18 DIAGNOSIS — E119 Type 2 diabetes mellitus without complications: Secondary | ICD-10-CM | POA: Diagnosis not present

## 2017-05-20 DIAGNOSIS — F0390 Unspecified dementia without behavioral disturbance: Secondary | ICD-10-CM | POA: Diagnosis not present

## 2017-05-20 DIAGNOSIS — E119 Type 2 diabetes mellitus without complications: Secondary | ICD-10-CM | POA: Diagnosis not present

## 2017-05-20 DIAGNOSIS — M5136 Other intervertebral disc degeneration, lumbar region: Secondary | ICD-10-CM | POA: Diagnosis not present

## 2017-05-20 DIAGNOSIS — F329 Major depressive disorder, single episode, unspecified: Secondary | ICD-10-CM | POA: Diagnosis not present

## 2017-05-20 DIAGNOSIS — M48061 Spinal stenosis, lumbar region without neurogenic claudication: Secondary | ICD-10-CM | POA: Diagnosis not present

## 2017-05-20 DIAGNOSIS — M170 Bilateral primary osteoarthritis of knee: Secondary | ICD-10-CM | POA: Diagnosis not present

## 2017-05-21 DIAGNOSIS — F329 Major depressive disorder, single episode, unspecified: Secondary | ICD-10-CM | POA: Diagnosis not present

## 2017-05-21 DIAGNOSIS — M5136 Other intervertebral disc degeneration, lumbar region: Secondary | ICD-10-CM | POA: Diagnosis not present

## 2017-05-21 DIAGNOSIS — M170 Bilateral primary osteoarthritis of knee: Secondary | ICD-10-CM | POA: Diagnosis not present

## 2017-05-21 DIAGNOSIS — M48061 Spinal stenosis, lumbar region without neurogenic claudication: Secondary | ICD-10-CM | POA: Diagnosis not present

## 2017-05-21 DIAGNOSIS — E119 Type 2 diabetes mellitus without complications: Secondary | ICD-10-CM | POA: Diagnosis not present

## 2017-05-21 DIAGNOSIS — F0390 Unspecified dementia without behavioral disturbance: Secondary | ICD-10-CM | POA: Diagnosis not present

## 2017-05-26 DIAGNOSIS — M48061 Spinal stenosis, lumbar region without neurogenic claudication: Secondary | ICD-10-CM | POA: Diagnosis not present

## 2017-05-26 DIAGNOSIS — M170 Bilateral primary osteoarthritis of knee: Secondary | ICD-10-CM | POA: Diagnosis not present

## 2017-05-26 DIAGNOSIS — E119 Type 2 diabetes mellitus without complications: Secondary | ICD-10-CM | POA: Diagnosis not present

## 2017-05-26 DIAGNOSIS — F329 Major depressive disorder, single episode, unspecified: Secondary | ICD-10-CM | POA: Diagnosis not present

## 2017-05-26 DIAGNOSIS — F0390 Unspecified dementia without behavioral disturbance: Secondary | ICD-10-CM | POA: Diagnosis not present

## 2017-05-26 DIAGNOSIS — M5136 Other intervertebral disc degeneration, lumbar region: Secondary | ICD-10-CM | POA: Diagnosis not present

## 2017-05-28 DIAGNOSIS — F329 Major depressive disorder, single episode, unspecified: Secondary | ICD-10-CM | POA: Diagnosis not present

## 2017-05-28 DIAGNOSIS — F0390 Unspecified dementia without behavioral disturbance: Secondary | ICD-10-CM | POA: Diagnosis not present

## 2017-05-28 DIAGNOSIS — M5136 Other intervertebral disc degeneration, lumbar region: Secondary | ICD-10-CM | POA: Diagnosis not present

## 2017-05-28 DIAGNOSIS — M48061 Spinal stenosis, lumbar region without neurogenic claudication: Secondary | ICD-10-CM | POA: Diagnosis not present

## 2017-05-28 DIAGNOSIS — E119 Type 2 diabetes mellitus without complications: Secondary | ICD-10-CM | POA: Diagnosis not present

## 2017-05-28 DIAGNOSIS — M170 Bilateral primary osteoarthritis of knee: Secondary | ICD-10-CM | POA: Diagnosis not present

## 2017-06-01 ENCOUNTER — Telehealth: Payer: Self-pay

## 2017-06-01 NOTE — Telephone Encounter (Signed)
Received a confirmation from cover my meds regarding a prior authorization approval for Uloric 40mg  from 06/01/2017 to 06/02/2018.   Reference number:none Phone number:none  Will send document to scan center once received.  Called pt to update. Spoke with pts daughter who voices understanding and denies any questions at this time.   Arcangel Minion, [place], CPhT 11:22 AM

## 2017-06-01 NOTE — Telephone Encounter (Signed)
Received a prior authorization request from [facility] for Uloric 40mg . Authorization has been submitted to pts insurance via cover my meds. Will update once we have a response.   Cielle Aguila, [place], CPhT 8:54 AM

## 2017-06-02 DIAGNOSIS — M48061 Spinal stenosis, lumbar region without neurogenic claudication: Secondary | ICD-10-CM | POA: Diagnosis not present

## 2017-06-02 DIAGNOSIS — F0390 Unspecified dementia without behavioral disturbance: Secondary | ICD-10-CM | POA: Diagnosis not present

## 2017-06-02 DIAGNOSIS — F329 Major depressive disorder, single episode, unspecified: Secondary | ICD-10-CM | POA: Diagnosis not present

## 2017-06-02 DIAGNOSIS — E119 Type 2 diabetes mellitus without complications: Secondary | ICD-10-CM | POA: Diagnosis not present

## 2017-06-02 DIAGNOSIS — M5136 Other intervertebral disc degeneration, lumbar region: Secondary | ICD-10-CM | POA: Diagnosis not present

## 2017-06-02 DIAGNOSIS — M170 Bilateral primary osteoarthritis of knee: Secondary | ICD-10-CM | POA: Diagnosis not present

## 2017-06-04 DIAGNOSIS — M48061 Spinal stenosis, lumbar region without neurogenic claudication: Secondary | ICD-10-CM | POA: Diagnosis not present

## 2017-06-04 DIAGNOSIS — F0390 Unspecified dementia without behavioral disturbance: Secondary | ICD-10-CM | POA: Diagnosis not present

## 2017-06-04 DIAGNOSIS — M170 Bilateral primary osteoarthritis of knee: Secondary | ICD-10-CM | POA: Diagnosis not present

## 2017-06-04 DIAGNOSIS — F329 Major depressive disorder, single episode, unspecified: Secondary | ICD-10-CM | POA: Diagnosis not present

## 2017-06-04 DIAGNOSIS — M5136 Other intervertebral disc degeneration, lumbar region: Secondary | ICD-10-CM | POA: Diagnosis not present

## 2017-06-04 DIAGNOSIS — E119 Type 2 diabetes mellitus without complications: Secondary | ICD-10-CM | POA: Diagnosis not present

## 2017-06-06 ENCOUNTER — Other Ambulatory Visit: Payer: Self-pay | Admitting: Rheumatology

## 2017-06-07 NOTE — Telephone Encounter (Signed)
Last Visit: 11/11/16 Next visit: 11/04/17 Labs: 01/08/17 Stable  Okay to refill per Dr. Estanislado Pandy

## 2017-06-09 DIAGNOSIS — F329 Major depressive disorder, single episode, unspecified: Secondary | ICD-10-CM | POA: Diagnosis not present

## 2017-06-09 DIAGNOSIS — F0390 Unspecified dementia without behavioral disturbance: Secondary | ICD-10-CM | POA: Diagnosis not present

## 2017-06-09 DIAGNOSIS — M5136 Other intervertebral disc degeneration, lumbar region: Secondary | ICD-10-CM | POA: Diagnosis not present

## 2017-06-09 DIAGNOSIS — E119 Type 2 diabetes mellitus without complications: Secondary | ICD-10-CM | POA: Diagnosis not present

## 2017-06-09 DIAGNOSIS — M48061 Spinal stenosis, lumbar region without neurogenic claudication: Secondary | ICD-10-CM | POA: Diagnosis not present

## 2017-06-09 DIAGNOSIS — M170 Bilateral primary osteoarthritis of knee: Secondary | ICD-10-CM | POA: Diagnosis not present

## 2017-06-11 DIAGNOSIS — M170 Bilateral primary osteoarthritis of knee: Secondary | ICD-10-CM | POA: Diagnosis not present

## 2017-06-11 DIAGNOSIS — E119 Type 2 diabetes mellitus without complications: Secondary | ICD-10-CM | POA: Diagnosis not present

## 2017-06-11 DIAGNOSIS — F329 Major depressive disorder, single episode, unspecified: Secondary | ICD-10-CM | POA: Diagnosis not present

## 2017-06-11 DIAGNOSIS — F0390 Unspecified dementia without behavioral disturbance: Secondary | ICD-10-CM | POA: Diagnosis not present

## 2017-06-11 DIAGNOSIS — M5136 Other intervertebral disc degeneration, lumbar region: Secondary | ICD-10-CM | POA: Diagnosis not present

## 2017-06-11 DIAGNOSIS — M48061 Spinal stenosis, lumbar region without neurogenic claudication: Secondary | ICD-10-CM | POA: Diagnosis not present

## 2017-06-14 DIAGNOSIS — F0390 Unspecified dementia without behavioral disturbance: Secondary | ICD-10-CM | POA: Diagnosis not present

## 2017-06-14 DIAGNOSIS — E119 Type 2 diabetes mellitus without complications: Secondary | ICD-10-CM | POA: Diagnosis not present

## 2017-06-14 DIAGNOSIS — M170 Bilateral primary osteoarthritis of knee: Secondary | ICD-10-CM | POA: Diagnosis not present

## 2017-06-14 DIAGNOSIS — M48061 Spinal stenosis, lumbar region without neurogenic claudication: Secondary | ICD-10-CM | POA: Diagnosis not present

## 2017-06-14 DIAGNOSIS — M5136 Other intervertebral disc degeneration, lumbar region: Secondary | ICD-10-CM | POA: Diagnosis not present

## 2017-06-14 DIAGNOSIS — F329 Major depressive disorder, single episode, unspecified: Secondary | ICD-10-CM | POA: Diagnosis not present

## 2017-06-15 ENCOUNTER — Encounter

## 2017-06-15 ENCOUNTER — Ambulatory Visit: Payer: Medicare Other | Admitting: Neurology

## 2017-06-15 DIAGNOSIS — F0390 Unspecified dementia without behavioral disturbance: Secondary | ICD-10-CM | POA: Diagnosis not present

## 2017-06-15 DIAGNOSIS — E119 Type 2 diabetes mellitus without complications: Secondary | ICD-10-CM | POA: Diagnosis not present

## 2017-06-15 DIAGNOSIS — M48061 Spinal stenosis, lumbar region without neurogenic claudication: Secondary | ICD-10-CM | POA: Diagnosis not present

## 2017-06-15 DIAGNOSIS — M5136 Other intervertebral disc degeneration, lumbar region: Secondary | ICD-10-CM | POA: Diagnosis not present

## 2017-06-15 DIAGNOSIS — M170 Bilateral primary osteoarthritis of knee: Secondary | ICD-10-CM | POA: Diagnosis not present

## 2017-06-15 DIAGNOSIS — F329 Major depressive disorder, single episode, unspecified: Secondary | ICD-10-CM | POA: Diagnosis not present

## 2017-06-16 DIAGNOSIS — M170 Bilateral primary osteoarthritis of knee: Secondary | ICD-10-CM | POA: Diagnosis not present

## 2017-06-16 DIAGNOSIS — M5136 Other intervertebral disc degeneration, lumbar region: Secondary | ICD-10-CM | POA: Diagnosis not present

## 2017-06-16 DIAGNOSIS — M48061 Spinal stenosis, lumbar region without neurogenic claudication: Secondary | ICD-10-CM | POA: Diagnosis not present

## 2017-06-16 DIAGNOSIS — E119 Type 2 diabetes mellitus without complications: Secondary | ICD-10-CM | POA: Diagnosis not present

## 2017-06-16 DIAGNOSIS — F329 Major depressive disorder, single episode, unspecified: Secondary | ICD-10-CM | POA: Diagnosis not present

## 2017-06-16 DIAGNOSIS — F0390 Unspecified dementia without behavioral disturbance: Secondary | ICD-10-CM | POA: Diagnosis not present

## 2017-06-23 DIAGNOSIS — F0390 Unspecified dementia without behavioral disturbance: Secondary | ICD-10-CM | POA: Diagnosis not present

## 2017-06-23 DIAGNOSIS — M170 Bilateral primary osteoarthritis of knee: Secondary | ICD-10-CM | POA: Diagnosis not present

## 2017-06-23 DIAGNOSIS — M5136 Other intervertebral disc degeneration, lumbar region: Secondary | ICD-10-CM | POA: Diagnosis not present

## 2017-06-23 DIAGNOSIS — F329 Major depressive disorder, single episode, unspecified: Secondary | ICD-10-CM | POA: Diagnosis not present

## 2017-06-23 DIAGNOSIS — E119 Type 2 diabetes mellitus without complications: Secondary | ICD-10-CM | POA: Diagnosis not present

## 2017-06-23 DIAGNOSIS — M48061 Spinal stenosis, lumbar region without neurogenic claudication: Secondary | ICD-10-CM | POA: Diagnosis not present

## 2017-06-24 DIAGNOSIS — M170 Bilateral primary osteoarthritis of knee: Secondary | ICD-10-CM | POA: Diagnosis not present

## 2017-06-24 DIAGNOSIS — M5136 Other intervertebral disc degeneration, lumbar region: Secondary | ICD-10-CM | POA: Diagnosis not present

## 2017-06-24 DIAGNOSIS — F0390 Unspecified dementia without behavioral disturbance: Secondary | ICD-10-CM | POA: Diagnosis not present

## 2017-06-24 DIAGNOSIS — F329 Major depressive disorder, single episode, unspecified: Secondary | ICD-10-CM | POA: Diagnosis not present

## 2017-06-24 DIAGNOSIS — M48061 Spinal stenosis, lumbar region without neurogenic claudication: Secondary | ICD-10-CM | POA: Diagnosis not present

## 2017-06-24 DIAGNOSIS — E119 Type 2 diabetes mellitus without complications: Secondary | ICD-10-CM | POA: Diagnosis not present

## 2017-06-25 DIAGNOSIS — M170 Bilateral primary osteoarthritis of knee: Secondary | ICD-10-CM | POA: Diagnosis not present

## 2017-06-25 DIAGNOSIS — M48061 Spinal stenosis, lumbar region without neurogenic claudication: Secondary | ICD-10-CM | POA: Diagnosis not present

## 2017-06-25 DIAGNOSIS — E119 Type 2 diabetes mellitus without complications: Secondary | ICD-10-CM | POA: Diagnosis not present

## 2017-06-25 DIAGNOSIS — F0390 Unspecified dementia without behavioral disturbance: Secondary | ICD-10-CM | POA: Diagnosis not present

## 2017-06-25 DIAGNOSIS — M5136 Other intervertebral disc degeneration, lumbar region: Secondary | ICD-10-CM | POA: Diagnosis not present

## 2017-06-25 DIAGNOSIS — F329 Major depressive disorder, single episode, unspecified: Secondary | ICD-10-CM | POA: Diagnosis not present

## 2017-06-30 DIAGNOSIS — M5136 Other intervertebral disc degeneration, lumbar region: Secondary | ICD-10-CM | POA: Diagnosis not present

## 2017-06-30 DIAGNOSIS — M48061 Spinal stenosis, lumbar region without neurogenic claudication: Secondary | ICD-10-CM | POA: Diagnosis not present

## 2017-06-30 DIAGNOSIS — M170 Bilateral primary osteoarthritis of knee: Secondary | ICD-10-CM | POA: Diagnosis not present

## 2017-06-30 DIAGNOSIS — F329 Major depressive disorder, single episode, unspecified: Secondary | ICD-10-CM | POA: Diagnosis not present

## 2017-06-30 DIAGNOSIS — E119 Type 2 diabetes mellitus without complications: Secondary | ICD-10-CM | POA: Diagnosis not present

## 2017-06-30 DIAGNOSIS — F0390 Unspecified dementia without behavioral disturbance: Secondary | ICD-10-CM | POA: Diagnosis not present

## 2017-07-01 DIAGNOSIS — F0390 Unspecified dementia without behavioral disturbance: Secondary | ICD-10-CM | POA: Diagnosis not present

## 2017-07-01 DIAGNOSIS — M48061 Spinal stenosis, lumbar region without neurogenic claudication: Secondary | ICD-10-CM | POA: Diagnosis not present

## 2017-07-01 DIAGNOSIS — F329 Major depressive disorder, single episode, unspecified: Secondary | ICD-10-CM | POA: Diagnosis not present

## 2017-07-01 DIAGNOSIS — E119 Type 2 diabetes mellitus without complications: Secondary | ICD-10-CM | POA: Diagnosis not present

## 2017-07-01 DIAGNOSIS — M170 Bilateral primary osteoarthritis of knee: Secondary | ICD-10-CM | POA: Diagnosis not present

## 2017-07-01 DIAGNOSIS — M5136 Other intervertebral disc degeneration, lumbar region: Secondary | ICD-10-CM | POA: Diagnosis not present

## 2017-07-02 DIAGNOSIS — F329 Major depressive disorder, single episode, unspecified: Secondary | ICD-10-CM | POA: Diagnosis not present

## 2017-07-02 DIAGNOSIS — F0390 Unspecified dementia without behavioral disturbance: Secondary | ICD-10-CM | POA: Diagnosis not present

## 2017-07-02 DIAGNOSIS — M5136 Other intervertebral disc degeneration, lumbar region: Secondary | ICD-10-CM | POA: Diagnosis not present

## 2017-07-02 DIAGNOSIS — M48061 Spinal stenosis, lumbar region without neurogenic claudication: Secondary | ICD-10-CM | POA: Diagnosis not present

## 2017-07-02 DIAGNOSIS — M170 Bilateral primary osteoarthritis of knee: Secondary | ICD-10-CM | POA: Diagnosis not present

## 2017-07-02 DIAGNOSIS — E119 Type 2 diabetes mellitus without complications: Secondary | ICD-10-CM | POA: Diagnosis not present

## 2017-07-06 DIAGNOSIS — E119 Type 2 diabetes mellitus without complications: Secondary | ICD-10-CM | POA: Diagnosis not present

## 2017-07-06 DIAGNOSIS — F0390 Unspecified dementia without behavioral disturbance: Secondary | ICD-10-CM | POA: Diagnosis not present

## 2017-07-06 DIAGNOSIS — M48061 Spinal stenosis, lumbar region without neurogenic claudication: Secondary | ICD-10-CM | POA: Diagnosis not present

## 2017-07-06 DIAGNOSIS — M5136 Other intervertebral disc degeneration, lumbar region: Secondary | ICD-10-CM | POA: Diagnosis not present

## 2017-07-06 DIAGNOSIS — F329 Major depressive disorder, single episode, unspecified: Secondary | ICD-10-CM | POA: Diagnosis not present

## 2017-07-06 DIAGNOSIS — M170 Bilateral primary osteoarthritis of knee: Secondary | ICD-10-CM | POA: Diagnosis not present

## 2017-07-10 ENCOUNTER — Other Ambulatory Visit: Payer: Self-pay | Admitting: Rheumatology

## 2017-07-12 NOTE — Telephone Encounter (Addendum)
Last Visit: 11/11/16 Next visit: 11/04/17 Labs: 01/08/17 Stable  Patient's daughter who is on dpr advised patient is due to update labs. She will bring patient on 07/16/17.  Okay to refill 30 day supply per Dr. Estanislado Pandy

## 2017-07-16 ENCOUNTER — Other Ambulatory Visit: Payer: Self-pay

## 2017-07-16 ENCOUNTER — Other Ambulatory Visit: Payer: Self-pay | Admitting: *Deleted

## 2017-07-16 DIAGNOSIS — Z79899 Other long term (current) drug therapy: Secondary | ICD-10-CM

## 2017-07-17 LAB — COMPLETE METABOLIC PANEL WITH GFR
AG Ratio: 2.2 (calc) (ref 1.0–2.5)
ALBUMIN MSPROF: 4.1 g/dL (ref 3.6–5.1)
ALT: 14 U/L (ref 6–29)
AST: 17 U/L (ref 10–35)
Alkaline phosphatase (APISO): 70 U/L (ref 33–130)
BILIRUBIN TOTAL: 0.5 mg/dL (ref 0.2–1.2)
BUN / CREAT RATIO: 23 (calc) — AB (ref 6–22)
BUN: 24 mg/dL (ref 7–25)
CHLORIDE: 102 mmol/L (ref 98–110)
CO2: 32 mmol/L (ref 20–32)
Calcium: 9.9 mg/dL (ref 8.6–10.4)
Creat: 1.05 mg/dL — ABNORMAL HIGH (ref 0.60–0.93)
GFR, EST AFRICAN AMERICAN: 58 mL/min/{1.73_m2} — AB (ref >=60)
GFR, Est Non African American: 50 mL/min/{1.73_m2} — ABNORMAL LOW (ref >=60)
GLUCOSE: 115 mg/dL — AB (ref 65–99)
Globulin: 1.9 g/dL (calc) (ref 1.9–3.7)
POTASSIUM: 4.3 mmol/L (ref 3.5–5.3)
Sodium: 142 mmol/L (ref 135–146)
TOTAL PROTEIN: 6 g/dL — AB (ref 6.1–8.1)

## 2017-07-17 LAB — CBC WITH DIFFERENTIAL/PLATELET
Basophils Absolute: 41 cells/uL (ref 0–200)
Basophils Relative: 0.6 %
EOS PCT: 1.8 %
Eosinophils Absolute: 122 cells/uL (ref 15–500)
HCT: 41.1 % (ref 35.0–45.0)
HEMOGLOBIN: 13.9 g/dL (ref 11.7–15.5)
LYMPHS ABS: 2394 {cells}/uL (ref 850–3900)
MCH: 31.3 pg (ref 27.0–33.0)
MCHC: 33.8 g/dL (ref 32.0–36.0)
MCV: 92.6 fL (ref 80.0–100.0)
MPV: 10.9 fL (ref 7.5–12.5)
Monocytes Relative: 9.8 %
NEUTROS ABS: 3577 {cells}/uL (ref 1500–7800)
Neutrophils Relative %: 52.6 %
Platelets: 277 10*3/uL (ref 140–400)
RBC: 4.44 10*6/uL (ref 3.80–5.10)
RDW: 12.2 % (ref 11.0–15.0)
Total Lymphocyte: 35.2 %
WBC mixed population: 666 cells/uL (ref 200–950)
WBC: 6.8 10*3/uL (ref 3.8–10.8)

## 2017-07-19 NOTE — Progress Notes (Signed)
Labs are stable.

## 2017-08-08 ENCOUNTER — Other Ambulatory Visit: Payer: Self-pay | Admitting: Rheumatology

## 2017-08-09 NOTE — Telephone Encounter (Signed)
Last Visit: 11/11/16 Next visit: 11/04/17 Labs: 07/16/17 Stable  Okay to refill per Dr. Estanislado Pandy

## 2017-08-17 ENCOUNTER — Other Ambulatory Visit: Payer: Self-pay | Admitting: Rheumatology

## 2017-08-19 ENCOUNTER — Other Ambulatory Visit: Payer: Self-pay | Admitting: Rheumatology

## 2017-08-23 ENCOUNTER — Other Ambulatory Visit: Payer: Self-pay

## 2017-08-23 NOTE — Patient Outreach (Addendum)
[facility] [facility]) Care Management  08/23/2017  [name] 27-Jun-1940 [id number]   Telephone Screen  Referral Date: 08/23/17 Referral Source: MD office Referral Reason: " SW, memory loss" Insurance: Medicare    Outreach attempt # 1 to patient. Spoke with daughter( DPR form on file) due to patient's documented mental status and screening completed.    Social: Daughter states that patient resides in her apartment alone. She lives at the [place] [facility].Dtr states that staff have been working with her given patient's condition as she should technically not still be living there. However, they will not renew the patient's contract. Daughter voice that she has tried applying for Medicaid online about five weeks ago. She voices that she was not able to submit the proper required documents online as supporting evidence. As a result, she feels that patient was wrongfully denied Medicaid. She desires to appeal this. Daughter states that she lives about 53mins away from patient and is only able to visit about once a week. She reports that patient is now requiring assistance with all ADLS/IADLs. However, due to dementia patient is refusing/declining health. Caregiver voices that she can "no longer handle her" and needs help getting patient placed somewhere. She voices that patient has had several falls in her home as well.    Conditions: Per chart review, patient has PMH of HTN, hypothyroidism, HLD, DM, osteoporosis, sleep apnea, vertigo,d depression and anxiety. Dtr states that patient has cbg meter in the home but is no longer able to cognitively remember and perform cbg checks. Last A1C on file was 5.6(April 2019).   Medications: Daughter voices that she goes once a week to fill med planner for patient. However, there is no one to oversee if patient correctly taking meds. She voices that there has been times where patient has forgotten to take  meds and well as times where she has taken too many meds. Daughter voices that she works and lives too far away from patient to go over every day to ensure that patient takes her meds.   Appointments: Patient followed by PCP and neurologist.    Advance Directives: Daughter voices that she is patient's dual POA. No copy on file.    Consent: [facility] services reviewed and discussed with daughter. She agrees to pharmacy and SW assistance. She does not feel like community nurse would be effective She voices that patient is too difficult to "reach" and be available for home visit and that visits would have to be coordinated with her and she is not available due to her work schedule.    Plan: RN CM will send [facility] SW referral for possible placement assistance and caregiver resources/support. RN CM will send [place] referral for polypharmacy med review and med assistance.    Enzo Montgomery, RN,BSN,CCM [place] Management Telephonic Care Management Coordinator Direct Phone: [phone] Toll Free: [phone] Fax: [phone]

## 2017-08-24 ENCOUNTER — Telehealth: Payer: Self-pay | Admitting: Pharmacist

## 2017-08-24 NOTE — Patient Outreach (Signed)
[place] [facility]) Care Management  08/24/2017  KATRENA STEHLIN 11/16/1940 [id number]    [facility] referral for polypharmacy and medication management.  Successful call to daughter (caregiver) with HIPAA identifiers verified. PMH significant for, but not limited to: dementia, HTN, GERD, DMT2, HLD, gout hypothyroidism.    Daughter is trying to keep mom at home as long as they can (brother lives in [place]).  Over the past year, she reports patient has been declining and reached the anger phase.  Patient is currently living at an [employer] ([place]) with active efforts from daughter to obtain long-term/memory care placement.  Daughter reports seeing her mom frequently throughout the week.  Every Saturday she goes to fill pill box for patient.  She states on a good week patient will take medications 4 out of the 7 days as prescribed, but this varies from day to day.  Son will call AM and PM to remind patient to take medications.  Unable to review medications as daughter was at work.  Left message for RN Marzetta Board) at Dr. Jason Nest office Sadie Haber MD) to fax up to date med list.  Plan: -Daughter to call Pharmacist back with up to date med list later today (will compare w/ PCP med list) -Will schedule f/u outreach call for tomorrow

## 2017-08-25 ENCOUNTER — Other Ambulatory Visit: Payer: Self-pay | Admitting: Licensed Clinical Social Worker

## 2017-08-25 ENCOUNTER — Other Ambulatory Visit: Payer: Self-pay | Admitting: Pharmacist

## 2017-08-25 ENCOUNTER — Ambulatory Visit: Payer: Self-pay | Admitting: Pharmacist

## 2017-08-25 NOTE — Patient Outreach (Signed)
[place] [facility]) Care Management  08/25/2017  [name] 10-17-1940 [id number]  [facility] CSW received new referral on 08/23/17 for LTC placement assistance. Patient's daughter has attempted to apply for Medicaid in the past online for patient but that this was not able to be completed because she was not able to submit the proper required documents online as supporting evidence and as a result patient was denied Medicaid. [facility] CSW completed outreach call to patient's daughter but was unable to reach her successfully. HIPPA compliant voice message left encouraging a return call once available. [facility] CSW successfully faxed FL2 to patient's PCP for completion on 08/24/17. [facility] CSW will await for return call from family and will assist patient with LTC placement.  [name], BSW, MSW, [name] Phone: [phone] Fax: [phone]

## 2017-08-25 NOTE — Patient Outreach (Signed)
[place] [facility]) Care Management  08/25/2017  [name] October 29, 1940 [id number]   HIPPA compliant voice message left on daughter's mobile phone with callback information. Daughter had requested I call after 5:30pm.  Medication list received from Dr. Oran Rein office [facility] physicians-RN [place]).  Medication discrepancies exist between PCP list and CHL.  Will reconcile once daughter returns call..    Plan: I will follow up with patient's daughter in 1-2 business days

## 2017-08-26 ENCOUNTER — Other Ambulatory Visit: Payer: Self-pay | Admitting: Licensed Clinical Social Worker

## 2017-08-26 NOTE — Patient Outreach (Signed)
[place] [facility]) Care Management  08/26/2017  CHANI GHANEM 1940/02/11 [id number]  Assessment-CSW received missed call and voice message from patient's daughter on 08/25/17. Voice message left stated that Jalynne's correct number is [phone]. CSW completed outreach attempt today to patient's daughter but was unable to reach her successfully. CSW left a HIPPA compliant voice message encouraging her to return call once available.  Plan-CSW will await return call or complete an additional outreach if needed.  Eula Fried, BSW, MSW, [email or web address] Phone: [phone] Fax: [phone]

## 2017-08-27 ENCOUNTER — Other Ambulatory Visit: Payer: Self-pay | Admitting: Licensed Clinical Social Worker

## 2017-08-27 ENCOUNTER — Other Ambulatory Visit: Payer: Self-pay | Admitting: Pharmacist

## 2017-08-27 ENCOUNTER — Ambulatory Visit: Payer: Self-pay | Admitting: Pharmacist

## 2017-08-27 NOTE — Patient Outreach (Signed)
[place] [facility]) Care Management  08/27/2017  [name] 1940/03/13 [id number]   Call placed to daughter with no answer.  HIPPA compliant voice message left on daughter's mobile phone with callback information.  Efforts to reconcile medication list continue.  Medication list received from Dr. Oran Rein office [facility] physicians-RN [place]).  Medication discrepancies exist between PCP list and CHL.  Will reconcile once daughter returns call..    Plan: I will follow up with patient's daughter in 1-2 business days if call not received sooner  Regina Eck, PharmD, [place]  [phone]

## 2017-08-27 NOTE — Patient Outreach (Addendum)
[facility]) Care Management  08/27/2017  HOUDA BRAU 1940/02/15 [id number]  [facility] CSW received incoming return call from patient's daughter. HIPPA verifications received successfully. [facility] CSW introduced self, reason for initial call and of [place] social work services. Family agreeable to services. Daughter is wanting assistance with getting patient placed into a nursing facility as she is no longer able to live safe at ILF. Daughter is unsure if patient will need ALF or skilled nursing but knows that patient is in need of a memory care unit. [facility] CSW provided extensive education on the LTC placement process as well as the Medicaid application process. [facility] CSW sent a secure email to patient's daughter with a list of local ALF, local SNF's, tips and tricks on how to apply for Medicaid as well as other LTC placement resources and information. Daughter was very appreciative of this information. Daughter is aware that FL2 has already been faxed to patient's PCP. [facility] CSW received notification later in the day that FL2 has now been completed. [facility] CSW sent email to daughter informing her that the [facility] is ready to be picked up by her in order for her to take to DSS. Patient is agreeable to go to DSS in person on 09/02/17 to apply for Medicaid on patient's behalf. [facility] CSW will follow up within one week.  [facility] CM Care Plan Problem One     Most Recent Value  Care Plan Problem One  Need for LTC placement   Role Documenting the Problem One  Clinical Social Worker  Care Plan for Problem One  Active  THN Long Term Goal   Pt will be placed at either an ALF or skilled nursing facility within 90 days as patient is no longer safe to live at [place] Start Date  08/27/17  Interventions for Problem One Long Term Goal  FL2 has been completed. Family has been contacted and educated on the LTC placement process. Family is agreeable to go to DSS next week to apply for Medicaid on patient's behalf.  [facility] CSW will await to hear back on which facilities family has chosen and will fax FL2 at that time.     Eula Fried, BSW, MSW, [email or web address] Phone: [phone] Fax: [phone]

## 2017-08-31 ENCOUNTER — Other Ambulatory Visit: Payer: Self-pay | Admitting: Pharmacist

## 2017-08-31 ENCOUNTER — Ambulatory Visit: Payer: Self-pay | Admitting: Pharmacist

## 2017-08-31 NOTE — Patient Outreach (Signed)
[place]) Care Management  08/31/2017  [name] 10-01-1940 [id number]  Medication list reconciled with daughter's home medication list for [name].  Updated medication list in CHL to reflect changes.  Plan: -Will to call daughter tonight after she gets off of work to discuss future steps in [name]'s medication management  Regina Eck, PharmD, [place]  [phone]

## 2017-09-08 ENCOUNTER — Other Ambulatory Visit: Payer: Self-pay | Admitting: Licensed Clinical Social Worker

## 2017-09-08 ENCOUNTER — Other Ambulatory Visit: Payer: Self-pay | Admitting: Pharmacist

## 2017-09-08 NOTE — Patient Outreach (Signed)
[place] [facility]) Care Management  09/08/2017  [name] Feb 08, 1941 [id number]  [facility] CSW completed outreach call to patient's daughter on 09/08/17. Daughter reports that she picked up a copy of the FL2 from PCP office but that she was not able to get the original copy of the FL2 because the doctor's office shredded the original. Daughter plans to go to DSS on 09/16/17 to apply for Medicaid on patient's behalf. [facility] CSW will continue to assist family with LTC placement. [facility] CSW educated daughter again on the placement process and necessary steps. [facility] CSW will follow up within one-two weeks.   THN CM Care Plan Problem One     Most Recent Value  Care Plan Problem One  Need for LTC placement   Role Documenting the Problem One  Clinical Social Worker  Care Plan for Problem One  Active  THN Long Term Goal   Pt will be placed at either an ALF or skilled nursing facility within 90 days as patient is no longer safe to live at [place] Start Date  08/27/17  Interventions for Problem One Long Term Goal  Daughter will go to DSS on 09/16/17 to apply for Medicaid in order to have patient placed. FL2 completed and received.     Eula Fried, BSW, MSW, [email or web address] Phone: [phone] Fax: [phone]

## 2017-09-08 NOTE — Patient Outreach (Addendum)
[name] [name]) Care Management  09/08/2017  [name] Sep 09, 1940 [id number]   Unsuccessful call attempt #3.  Call placed to daughter with no answer (mobile phone).  HIPPA compliant voice message left on daughter's mobile phone with callback information.    Medication list received from Dr. Oran [name] office [name]). Medication list received from daughter and updated in [name]. Daughter states that patient already has pillbox but doesn't use it correctly.  Daughter wishes to have someone aid in giving her mother her medications daily.  This service will be available at a long term care facility once patient is placed, however [name] is unable to provide this level of service.  PLAN: -Spoke with LCSW to inform them of my efforts -Will attempt outreach in the next 1-2 business days  [name], PharmD, [name]  [phone]

## 2017-09-10 ENCOUNTER — Other Ambulatory Visit: Payer: Self-pay | Admitting: Pharmacist

## 2017-09-10 ENCOUNTER — Ambulatory Visit: Payer: Self-pay | Admitting: Pharmacist

## 2017-09-10 NOTE — Patient Outreach (Addendum)
[place] [facility]) Care Management  09/10/2017  [name] 17-May-1940 [id number]    Unsuccessful call attempt #3 attempted. HIPAA complaint voicemail was left on her mobile phone. Patient's caregiver has not responded to previous attempts or outreach letter for Pharmacy services.  Patient's caregiver continues to work with [facility] LCSW on long-term placement.  Medication list was updated in [facility] by [facility] Pharmacist per caregiver's list and MD office list.  Discussed case with [facility] LCSW.  [facility] LCSW in agreement that long-term placement is ultimately what the patient needs.  [name] unable to administer/oversee medications to patient each day per caregiver's request.  Medication list reconciled.    PLAN: -[place] will close case.  I am happy to assist in the future as needed.  -Will route discipline case closure   Regina Eck, PharmD, [place]  [phone]

## 2017-09-17 ENCOUNTER — Other Ambulatory Visit: Payer: Self-pay | Admitting: Licensed Clinical Social Worker

## 2017-09-17 NOTE — Patient Outreach (Signed)
[facility]) Care Management  09/17/2017  [name] 04/27/1940 [id number]  [facility] CSW received incoming call from patient's daughter. Patient's daughter reports that she successfully went to DSS yesterday and applied for Special Assistance Medicaid for patient and received a Medicaid pending number. Daughter has chosen [facility] ALF. Daughter completed a tour of facility yesterday and met with the admissions department as well. [facility] CSW will continue to assist family with final steps of ALF placement.  Eula Fried, BSW, MSW, [email or web address] Phone: [phone] Fax: [phone]

## 2017-09-20 DIAGNOSIS — Z23 Encounter for immunization: Secondary | ICD-10-CM | POA: Diagnosis not present

## 2017-09-20 DIAGNOSIS — Z111 Encounter for screening for respiratory tuberculosis: Secondary | ICD-10-CM | POA: Diagnosis not present

## 2017-09-20 DIAGNOSIS — M109 Gout, unspecified: Secondary | ICD-10-CM | POA: Diagnosis not present

## 2017-09-20 DIAGNOSIS — N183 Chronic kidney disease, stage 3 (moderate): Secondary | ICD-10-CM | POA: Diagnosis not present

## 2017-09-20 DIAGNOSIS — I10 Essential (primary) hypertension: Secondary | ICD-10-CM | POA: Diagnosis not present

## 2017-09-20 DIAGNOSIS — E119 Type 2 diabetes mellitus without complications: Secondary | ICD-10-CM | POA: Diagnosis not present

## 2017-09-20 DIAGNOSIS — Z0000 Encounter for general adult medical examination without abnormal findings: Secondary | ICD-10-CM | POA: Diagnosis not present

## 2017-09-20 DIAGNOSIS — E785 Hyperlipidemia, unspecified: Secondary | ICD-10-CM | POA: Diagnosis not present

## 2017-09-20 DIAGNOSIS — E039 Hypothyroidism, unspecified: Secondary | ICD-10-CM | POA: Diagnosis not present

## 2017-09-22 ENCOUNTER — Other Ambulatory Visit: Payer: Self-pay | Admitting: Licensed Clinical Social Worker

## 2017-09-22 NOTE — Patient Outreach (Signed)
[place] [facility]) Care Management  09/22/2017  [name] Jan 19, 1941 [id number]  [facility] CSW completed outreach call to patient's caregiver/daughter and was able to reach her successfully. HIPPA verifications received. Daughter reports that patient has been accepted for LTC placement at [name]. Patient's admit date is 10/08/17. Patient had her physical and TB test completed on 09/20/17 and is due back to PCP office today for TB test reading. Daughter is very pleased with [name]'s facility and reports that it is less than a mile away from her home which will be extremely convenient. Family is agreeable to case closure now that patient has secured LTC placement at [name]. Family is agreeable to contact this [name] CSW if future social needs arise. [facility] CSW will send case closure notification to PCP at this time and will close case.  THN CM Care Plan Problem One     Most Recent Value  Care Plan Problem One  Need for LTC placement   Role Documenting the Problem One  Clinical Social Worker  Care Plan for Problem One  Active  THN Long Term Goal   Pt will be placed at either an ALF or skilled nursing facility within 90 days as patient is no longer safe to live at [place] Start Date  08/27/17  [facility] Long Term Goal Met Date  09/22/17  Interventions for Problem One Long Term Goal  Patient will be placed at [facility]F on 10/08/17. Patient has been accepted for placement. Medicaid has been applied for.     Eula Fried, BSW, MSW, [email or web address] Phone: [phone] Fax: [phone]

## 2017-09-24 ENCOUNTER — Other Ambulatory Visit: Payer: Self-pay | Admitting: Licensed Clinical Social Worker

## 2017-09-24 NOTE — Patient Outreach (Signed)
[place] [facility]) Care Management  09/24/2017  JESLIE LOWE 17-Apr-1940 [id number]   [facility] CSW received incoming call from patient's daughter. HIPPA verifications received. Daughter reports that Larene [place] in admissions at [facility] ALF stated that the [facility] provided was incorrect and did not include that patient needed ALF placement but instead stated SNF. Daughter contacted PCP office and provided update but was informed that the [facility] DID include the correct request for ALF placement. [facility] CSW completed call to [facility] in admissions but was unable to reach her. [facility] CSW left a voice message encouraging her to coordinate care with Margreta Journey at patient's PCP office and that she may need to fax over a new FL2. [facility] CSW will sign off at this time. Daughter very appreciative of care coordination provided.   Eula Fried, BSW, MSW, [email or web address] Phone: [phone] Fax: [phone]

## 2017-10-13 DIAGNOSIS — E119 Type 2 diabetes mellitus without complications: Secondary | ICD-10-CM | POA: Diagnosis not present

## 2017-10-13 DIAGNOSIS — I10 Essential (primary) hypertension: Secondary | ICD-10-CM | POA: Diagnosis not present

## 2017-10-13 DIAGNOSIS — E038 Other specified hypothyroidism: Secondary | ICD-10-CM | POA: Diagnosis not present

## 2017-10-13 DIAGNOSIS — F0280 Dementia in other diseases classified elsewhere without behavioral disturbance: Secondary | ICD-10-CM | POA: Diagnosis not present

## 2017-10-21 DIAGNOSIS — I10 Essential (primary) hypertension: Secondary | ICD-10-CM | POA: Diagnosis not present

## 2017-10-27 DIAGNOSIS — E119 Type 2 diabetes mellitus without complications: Secondary | ICD-10-CM | POA: Diagnosis not present

## 2017-10-27 DIAGNOSIS — F0280 Dementia in other diseases classified elsewhere without behavioral disturbance: Secondary | ICD-10-CM | POA: Diagnosis not present

## 2017-10-27 DIAGNOSIS — E038 Other specified hypothyroidism: Secondary | ICD-10-CM | POA: Diagnosis not present

## 2017-11-04 ENCOUNTER — Ambulatory Visit: Payer: Medicare Other | Admitting: Neurology

## 2017-11-05 DIAGNOSIS — F419 Anxiety disorder, unspecified: Secondary | ICD-10-CM | POA: Diagnosis not present

## 2017-11-08 DIAGNOSIS — F064 Anxiety disorder due to known physiological condition: Secondary | ICD-10-CM | POA: Diagnosis not present

## 2017-11-08 DIAGNOSIS — F331 Major depressive disorder, recurrent, moderate: Secondary | ICD-10-CM | POA: Diagnosis not present

## 2017-11-08 DIAGNOSIS — F0281 Dementia in other diseases classified elsewhere with behavioral disturbance: Secondary | ICD-10-CM | POA: Diagnosis not present

## 2017-11-18 DIAGNOSIS — B351 Tinea unguium: Secondary | ICD-10-CM | POA: Diagnosis not present

## 2017-11-18 DIAGNOSIS — M79675 Pain in left toe(s): Secondary | ICD-10-CM | POA: Diagnosis not present

## 2017-11-18 DIAGNOSIS — M79674 Pain in right toe(s): Secondary | ICD-10-CM | POA: Diagnosis not present

## 2017-11-22 DIAGNOSIS — F331 Major depressive disorder, recurrent, moderate: Secondary | ICD-10-CM | POA: Diagnosis not present

## 2017-11-22 DIAGNOSIS — F0281 Dementia in other diseases classified elsewhere with behavioral disturbance: Secondary | ICD-10-CM | POA: Diagnosis not present

## 2017-11-22 DIAGNOSIS — F064 Anxiety disorder due to known physiological condition: Secondary | ICD-10-CM | POA: Diagnosis not present

## 2017-11-24 DIAGNOSIS — E7849 Other hyperlipidemia: Secondary | ICD-10-CM | POA: Diagnosis not present

## 2017-11-24 DIAGNOSIS — E038 Other specified hypothyroidism: Secondary | ICD-10-CM | POA: Diagnosis not present

## 2017-11-24 DIAGNOSIS — E119 Type 2 diabetes mellitus without complications: Secondary | ICD-10-CM | POA: Diagnosis not present

## 2017-11-24 DIAGNOSIS — E876 Hypokalemia: Secondary | ICD-10-CM | POA: Diagnosis not present

## 2017-11-25 DIAGNOSIS — Z7901 Long term (current) use of anticoagulants: Secondary | ICD-10-CM | POA: Diagnosis not present

## 2017-11-25 DIAGNOSIS — I10 Essential (primary) hypertension: Secondary | ICD-10-CM | POA: Diagnosis not present

## 2017-11-25 DIAGNOSIS — Z23 Encounter for immunization: Secondary | ICD-10-CM | POA: Diagnosis not present

## 2017-11-25 DIAGNOSIS — E119 Type 2 diabetes mellitus without complications: Secondary | ICD-10-CM | POA: Diagnosis not present

## 2017-12-14 DIAGNOSIS — Z7901 Long term (current) use of anticoagulants: Secondary | ICD-10-CM | POA: Diagnosis not present

## 2017-12-14 DIAGNOSIS — I10 Essential (primary) hypertension: Secondary | ICD-10-CM | POA: Diagnosis not present

## 2017-12-20 DIAGNOSIS — F064 Anxiety disorder due to known physiological condition: Secondary | ICD-10-CM | POA: Diagnosis not present

## 2017-12-20 DIAGNOSIS — F331 Major depressive disorder, recurrent, moderate: Secondary | ICD-10-CM | POA: Diagnosis not present

## 2017-12-20 DIAGNOSIS — F0281 Dementia in other diseases classified elsewhere with behavioral disturbance: Secondary | ICD-10-CM | POA: Diagnosis not present

## 2018-01-17 DIAGNOSIS — F064 Anxiety disorder due to known physiological condition: Secondary | ICD-10-CM | POA: Diagnosis not present

## 2018-01-17 DIAGNOSIS — F331 Major depressive disorder, recurrent, moderate: Secondary | ICD-10-CM | POA: Diagnosis not present

## 2018-01-17 DIAGNOSIS — F0281 Dementia in other diseases classified elsewhere with behavioral disturbance: Secondary | ICD-10-CM | POA: Diagnosis not present

## 2018-02-14 DIAGNOSIS — F064 Anxiety disorder due to known physiological condition: Secondary | ICD-10-CM | POA: Diagnosis not present

## 2018-02-14 DIAGNOSIS — F0281 Dementia in other diseases classified elsewhere with behavioral disturbance: Secondary | ICD-10-CM | POA: Diagnosis not present

## 2018-02-14 DIAGNOSIS — F331 Major depressive disorder, recurrent, moderate: Secondary | ICD-10-CM | POA: Diagnosis not present

## 2018-02-21 ENCOUNTER — Encounter: Payer: Self-pay | Admitting: Neurology

## 2018-02-21 ENCOUNTER — Ambulatory Visit: Payer: Medicare Other | Admitting: Neurology

## 2018-02-21 DIAGNOSIS — Z029 Encounter for administrative examinations, unspecified: Secondary | ICD-10-CM

## 2018-02-28 DIAGNOSIS — F0281 Dementia in other diseases classified elsewhere with behavioral disturbance: Secondary | ICD-10-CM | POA: Diagnosis not present

## 2018-02-28 DIAGNOSIS — F064 Anxiety disorder due to known physiological condition: Secondary | ICD-10-CM | POA: Diagnosis not present

## 2018-02-28 DIAGNOSIS — F331 Major depressive disorder, recurrent, moderate: Secondary | ICD-10-CM | POA: Diagnosis not present

## 2018-03-17 DIAGNOSIS — E039 Hypothyroidism, unspecified: Secondary | ICD-10-CM | POA: Diagnosis not present

## 2018-03-28 DIAGNOSIS — F0281 Dementia in other diseases classified elsewhere with behavioral disturbance: Secondary | ICD-10-CM | POA: Diagnosis not present

## 2018-03-28 DIAGNOSIS — G301 Alzheimer's disease with late onset: Secondary | ICD-10-CM | POA: Diagnosis not present

## 2018-03-28 DIAGNOSIS — F064 Anxiety disorder due to known physiological condition: Secondary | ICD-10-CM | POA: Diagnosis not present

## 2018-03-28 DIAGNOSIS — F331 Major depressive disorder, recurrent, moderate: Secondary | ICD-10-CM | POA: Diagnosis not present

## 2018-04-11 DIAGNOSIS — F064 Anxiety disorder due to known physiological condition: Secondary | ICD-10-CM | POA: Diagnosis not present

## 2018-04-11 DIAGNOSIS — F0281 Dementia in other diseases classified elsewhere with behavioral disturbance: Secondary | ICD-10-CM | POA: Diagnosis not present

## 2018-04-11 DIAGNOSIS — F331 Major depressive disorder, recurrent, moderate: Secondary | ICD-10-CM | POA: Diagnosis not present

## 2018-04-11 DIAGNOSIS — G301 Alzheimer's disease with late onset: Secondary | ICD-10-CM | POA: Diagnosis not present

## 2018-04-27 DIAGNOSIS — F0280 Dementia in other diseases classified elsewhere without behavioral disturbance: Secondary | ICD-10-CM | POA: Diagnosis not present

## 2018-04-27 DIAGNOSIS — I10 Essential (primary) hypertension: Secondary | ICD-10-CM | POA: Diagnosis not present

## 2018-04-28 DIAGNOSIS — E119 Type 2 diabetes mellitus without complications: Secondary | ICD-10-CM | POA: Diagnosis not present

## 2018-04-28 DIAGNOSIS — I10 Essential (primary) hypertension: Secondary | ICD-10-CM | POA: Diagnosis not present

## 2018-05-23 DIAGNOSIS — F331 Major depressive disorder, recurrent, moderate: Secondary | ICD-10-CM | POA: Diagnosis not present

## 2018-05-23 DIAGNOSIS — F064 Anxiety disorder due to known physiological condition: Secondary | ICD-10-CM | POA: Diagnosis not present

## 2018-05-23 DIAGNOSIS — F0281 Dementia in other diseases classified elsewhere with behavioral disturbance: Secondary | ICD-10-CM | POA: Diagnosis not present

## 2018-05-23 DIAGNOSIS — G301 Alzheimer's disease with late onset: Secondary | ICD-10-CM | POA: Diagnosis not present

## 2018-06-14 DIAGNOSIS — I10 Essential (primary) hypertension: Secondary | ICD-10-CM | POA: Diagnosis not present

## 2018-06-14 DIAGNOSIS — E119 Type 2 diabetes mellitus without complications: Secondary | ICD-10-CM | POA: Diagnosis not present

## 2018-06-16 DIAGNOSIS — E038 Other specified hypothyroidism: Secondary | ICD-10-CM | POA: Diagnosis not present

## 2018-06-16 DIAGNOSIS — M810 Age-related osteoporosis without current pathological fracture: Secondary | ICD-10-CM | POA: Diagnosis not present

## 2018-06-16 DIAGNOSIS — E119 Type 2 diabetes mellitus without complications: Secondary | ICD-10-CM | POA: Diagnosis not present

## 2018-06-16 DIAGNOSIS — F331 Major depressive disorder, recurrent, moderate: Secondary | ICD-10-CM | POA: Diagnosis not present

## 2018-06-16 DIAGNOSIS — I10 Essential (primary) hypertension: Secondary | ICD-10-CM | POA: Diagnosis not present

## 2018-06-16 DIAGNOSIS — E7849 Other hyperlipidemia: Secondary | ICD-10-CM | POA: Diagnosis not present

## 2018-06-16 DIAGNOSIS — F0280 Dementia in other diseases classified elsewhere without behavioral disturbance: Secondary | ICD-10-CM | POA: Diagnosis not present

## 2018-06-16 DIAGNOSIS — M1049 Other secondary gout, multiple sites: Secondary | ICD-10-CM | POA: Diagnosis not present

## 2018-06-20 DIAGNOSIS — G301 Alzheimer's disease with late onset: Secondary | ICD-10-CM | POA: Diagnosis not present

## 2018-06-20 DIAGNOSIS — F331 Major depressive disorder, recurrent, moderate: Secondary | ICD-10-CM | POA: Diagnosis not present

## 2018-06-20 DIAGNOSIS — F064 Anxiety disorder due to known physiological condition: Secondary | ICD-10-CM | POA: Diagnosis not present

## 2018-06-20 DIAGNOSIS — F0281 Dementia in other diseases classified elsewhere with behavioral disturbance: Secondary | ICD-10-CM | POA: Diagnosis not present

## 2018-06-28 DIAGNOSIS — M1388 Other specified arthritis, other site: Secondary | ICD-10-CM | POA: Diagnosis not present

## 2018-06-28 DIAGNOSIS — N183 Chronic kidney disease, stage 3 (moderate): Secondary | ICD-10-CM | POA: Diagnosis not present

## 2018-06-28 DIAGNOSIS — E1122 Type 2 diabetes mellitus with diabetic chronic kidney disease: Secondary | ICD-10-CM | POA: Diagnosis not present

## 2018-06-28 DIAGNOSIS — E038 Other specified hypothyroidism: Secondary | ICD-10-CM | POA: Diagnosis not present

## 2018-06-28 DIAGNOSIS — I10 Essential (primary) hypertension: Secondary | ICD-10-CM | POA: Diagnosis not present

## 2018-06-28 DIAGNOSIS — E7849 Other hyperlipidemia: Secondary | ICD-10-CM | POA: Diagnosis not present

## 2018-06-28 DIAGNOSIS — F0280 Dementia in other diseases classified elsewhere without behavioral disturbance: Secondary | ICD-10-CM | POA: Diagnosis not present

## 2018-06-30 DIAGNOSIS — M5136 Other intervertebral disc degeneration, lumbar region: Secondary | ICD-10-CM | POA: Diagnosis not present

## 2018-07-12 DIAGNOSIS — E039 Hypothyroidism, unspecified: Secondary | ICD-10-CM | POA: Diagnosis not present

## 2018-07-12 DIAGNOSIS — I10 Essential (primary) hypertension: Secondary | ICD-10-CM | POA: Diagnosis not present

## 2018-07-12 DIAGNOSIS — F0390 Unspecified dementia without behavioral disturbance: Secondary | ICD-10-CM | POA: Diagnosis not present

## 2018-07-12 DIAGNOSIS — E785 Hyperlipidemia, unspecified: Secondary | ICD-10-CM | POA: Diagnosis not present

## 2018-07-18 DIAGNOSIS — F064 Anxiety disorder due to known physiological condition: Secondary | ICD-10-CM | POA: Diagnosis not present

## 2018-07-18 DIAGNOSIS — F331 Major depressive disorder, recurrent, moderate: Secondary | ICD-10-CM | POA: Diagnosis not present

## 2018-07-18 DIAGNOSIS — F0281 Dementia in other diseases classified elsewhere with behavioral disturbance: Secondary | ICD-10-CM | POA: Diagnosis not present

## 2018-07-18 DIAGNOSIS — G301 Alzheimer's disease with late onset: Secondary | ICD-10-CM | POA: Diagnosis not present

## 2018-08-10 DIAGNOSIS — M79675 Pain in left toe(s): Secondary | ICD-10-CM | POA: Diagnosis not present

## 2018-08-10 DIAGNOSIS — B351 Tinea unguium: Secondary | ICD-10-CM | POA: Diagnosis not present

## 2018-08-10 DIAGNOSIS — M79674 Pain in right toe(s): Secondary | ICD-10-CM | POA: Diagnosis not present

## 2018-08-11 DIAGNOSIS — I10 Essential (primary) hypertension: Secondary | ICD-10-CM | POA: Diagnosis not present

## 2018-08-11 DIAGNOSIS — E119 Type 2 diabetes mellitus without complications: Secondary | ICD-10-CM | POA: Diagnosis not present

## 2018-08-11 DIAGNOSIS — F331 Major depressive disorder, recurrent, moderate: Secondary | ICD-10-CM | POA: Diagnosis not present

## 2018-08-11 DIAGNOSIS — E7849 Other hyperlipidemia: Secondary | ICD-10-CM | POA: Diagnosis not present

## 2018-08-11 DIAGNOSIS — E038 Other specified hypothyroidism: Secondary | ICD-10-CM | POA: Diagnosis not present

## 2018-08-11 DIAGNOSIS — F0280 Dementia in other diseases classified elsewhere without behavioral disturbance: Secondary | ICD-10-CM | POA: Diagnosis not present

## 2018-08-11 DIAGNOSIS — M1049 Other secondary gout, multiple sites: Secondary | ICD-10-CM | POA: Diagnosis not present

## 2018-08-11 DIAGNOSIS — M810 Age-related osteoporosis without current pathological fracture: Secondary | ICD-10-CM | POA: Diagnosis not present

## 2018-08-16 DIAGNOSIS — F331 Major depressive disorder, recurrent, moderate: Secondary | ICD-10-CM | POA: Diagnosis not present

## 2018-08-16 DIAGNOSIS — F0281 Dementia in other diseases classified elsewhere with behavioral disturbance: Secondary | ICD-10-CM | POA: Diagnosis not present

## 2018-08-16 DIAGNOSIS — F064 Anxiety disorder due to known physiological condition: Secondary | ICD-10-CM | POA: Diagnosis not present

## 2018-08-16 DIAGNOSIS — G301 Alzheimer's disease with late onset: Secondary | ICD-10-CM | POA: Diagnosis not present

## 2018-08-31 DIAGNOSIS — R4689 Other symptoms and signs involving appearance and behavior: Secondary | ICD-10-CM | POA: Diagnosis not present

## 2018-09-07 DIAGNOSIS — S199XXA Unspecified injury of neck, initial encounter: Secondary | ICD-10-CM | POA: Diagnosis not present

## 2018-09-07 DIAGNOSIS — S0990XA Unspecified injury of head, initial encounter: Secondary | ICD-10-CM | POA: Diagnosis not present

## 2018-09-07 DIAGNOSIS — Y998 Other external cause status: Secondary | ICD-10-CM | POA: Diagnosis not present

## 2018-09-07 DIAGNOSIS — W01198A Fall on same level from slipping, tripping and stumbling with subsequent striking against other object, initial encounter: Secondary | ICD-10-CM | POA: Diagnosis not present

## 2018-09-07 DIAGNOSIS — R52 Pain, unspecified: Secondary | ICD-10-CM | POA: Diagnosis not present

## 2018-09-07 DIAGNOSIS — I252 Old myocardial infarction: Secondary | ICD-10-CM | POA: Diagnosis not present

## 2018-09-07 DIAGNOSIS — R51 Headache: Secondary | ICD-10-CM | POA: Diagnosis not present

## 2018-09-07 DIAGNOSIS — W19XXXA Unspecified fall, initial encounter: Secondary | ICD-10-CM | POA: Diagnosis not present

## 2018-09-07 DIAGNOSIS — R531 Weakness: Secondary | ICD-10-CM | POA: Diagnosis not present

## 2018-09-08 DIAGNOSIS — R5381 Other malaise: Secondary | ICD-10-CM | POA: Diagnosis not present

## 2018-09-08 DIAGNOSIS — Z743 Need for continuous supervision: Secondary | ICD-10-CM | POA: Diagnosis not present

## 2018-09-08 DIAGNOSIS — I252 Old myocardial infarction: Secondary | ICD-10-CM | POA: Diagnosis not present

## 2018-09-08 DIAGNOSIS — R279 Unspecified lack of coordination: Secondary | ICD-10-CM | POA: Diagnosis not present

## 2018-09-08 DIAGNOSIS — W19XXXA Unspecified fall, initial encounter: Secondary | ICD-10-CM | POA: Diagnosis not present

## 2018-09-13 DIAGNOSIS — F0281 Dementia in other diseases classified elsewhere with behavioral disturbance: Secondary | ICD-10-CM | POA: Diagnosis not present

## 2018-09-13 DIAGNOSIS — F0390 Unspecified dementia without behavioral disturbance: Secondary | ICD-10-CM | POA: Diagnosis not present

## 2018-09-13 DIAGNOSIS — G301 Alzheimer's disease with late onset: Secondary | ICD-10-CM | POA: Diagnosis not present

## 2018-09-13 DIAGNOSIS — I10 Essential (primary) hypertension: Secondary | ICD-10-CM | POA: Diagnosis not present

## 2018-09-13 DIAGNOSIS — E039 Hypothyroidism, unspecified: Secondary | ICD-10-CM | POA: Diagnosis not present

## 2018-09-13 DIAGNOSIS — E119 Type 2 diabetes mellitus without complications: Secondary | ICD-10-CM | POA: Diagnosis not present

## 2018-09-13 DIAGNOSIS — F331 Major depressive disorder, recurrent, moderate: Secondary | ICD-10-CM | POA: Diagnosis not present

## 2018-09-13 DIAGNOSIS — F064 Anxiety disorder due to known physiological condition: Secondary | ICD-10-CM | POA: Diagnosis not present

## 2018-09-14 DIAGNOSIS — I10 Essential (primary) hypertension: Secondary | ICD-10-CM | POA: Diagnosis not present

## 2018-09-14 DIAGNOSIS — W1789XA Other fall from one level to another, initial encounter: Secondary | ICD-10-CM | POA: Diagnosis not present

## 2018-09-14 DIAGNOSIS — M1388 Other specified arthritis, other site: Secondary | ICD-10-CM | POA: Diagnosis not present

## 2018-09-19 DIAGNOSIS — M810 Age-related osteoporosis without current pathological fracture: Secondary | ICD-10-CM | POA: Diagnosis not present

## 2018-09-19 DIAGNOSIS — M1990 Unspecified osteoarthritis, unspecified site: Secondary | ICD-10-CM | POA: Diagnosis not present

## 2018-09-19 DIAGNOSIS — F0390 Unspecified dementia without behavioral disturbance: Secondary | ICD-10-CM | POA: Diagnosis not present

## 2018-09-19 DIAGNOSIS — Z9181 History of falling: Secondary | ICD-10-CM | POA: Diagnosis not present

## 2018-09-19 DIAGNOSIS — Z7982 Long term (current) use of aspirin: Secondary | ICD-10-CM | POA: Diagnosis not present

## 2018-09-21 DIAGNOSIS — M1990 Unspecified osteoarthritis, unspecified site: Secondary | ICD-10-CM | POA: Diagnosis not present

## 2018-09-21 DIAGNOSIS — Z7982 Long term (current) use of aspirin: Secondary | ICD-10-CM | POA: Diagnosis not present

## 2018-09-21 DIAGNOSIS — F0390 Unspecified dementia without behavioral disturbance: Secondary | ICD-10-CM | POA: Diagnosis not present

## 2018-09-21 DIAGNOSIS — M810 Age-related osteoporosis without current pathological fracture: Secondary | ICD-10-CM | POA: Diagnosis not present

## 2018-09-21 DIAGNOSIS — Z9181 History of falling: Secondary | ICD-10-CM | POA: Diagnosis not present

## 2018-09-27 DIAGNOSIS — S01511A Laceration without foreign body of lip, initial encounter: Secondary | ICD-10-CM | POA: Diagnosis not present

## 2018-09-28 DIAGNOSIS — M1990 Unspecified osteoarthritis, unspecified site: Secondary | ICD-10-CM | POA: Diagnosis not present

## 2018-09-28 DIAGNOSIS — Z7982 Long term (current) use of aspirin: Secondary | ICD-10-CM | POA: Diagnosis not present

## 2018-09-28 DIAGNOSIS — M810 Age-related osteoporosis without current pathological fracture: Secondary | ICD-10-CM | POA: Diagnosis not present

## 2018-09-28 DIAGNOSIS — F0390 Unspecified dementia without behavioral disturbance: Secondary | ICD-10-CM | POA: Diagnosis not present

## 2018-09-28 DIAGNOSIS — Z9181 History of falling: Secondary | ICD-10-CM | POA: Diagnosis not present

## 2018-09-29 DIAGNOSIS — Z9181 History of falling: Secondary | ICD-10-CM | POA: Diagnosis not present

## 2018-09-29 DIAGNOSIS — F0390 Unspecified dementia without behavioral disturbance: Secondary | ICD-10-CM | POA: Diagnosis not present

## 2018-09-29 DIAGNOSIS — M1990 Unspecified osteoarthritis, unspecified site: Secondary | ICD-10-CM | POA: Diagnosis not present

## 2018-09-29 DIAGNOSIS — Z7982 Long term (current) use of aspirin: Secondary | ICD-10-CM | POA: Diagnosis not present

## 2018-09-29 DIAGNOSIS — M810 Age-related osteoporosis without current pathological fracture: Secondary | ICD-10-CM | POA: Diagnosis not present

## 2018-10-04 DIAGNOSIS — Z9181 History of falling: Secondary | ICD-10-CM | POA: Diagnosis not present

## 2018-10-04 DIAGNOSIS — F0390 Unspecified dementia without behavioral disturbance: Secondary | ICD-10-CM | POA: Diagnosis not present

## 2018-10-04 DIAGNOSIS — Z7982 Long term (current) use of aspirin: Secondary | ICD-10-CM | POA: Diagnosis not present

## 2018-10-04 DIAGNOSIS — M810 Age-related osteoporosis without current pathological fracture: Secondary | ICD-10-CM | POA: Diagnosis not present

## 2018-10-04 DIAGNOSIS — M1990 Unspecified osteoarthritis, unspecified site: Secondary | ICD-10-CM | POA: Diagnosis not present

## 2018-10-06 DIAGNOSIS — M1990 Unspecified osteoarthritis, unspecified site: Secondary | ICD-10-CM | POA: Diagnosis not present

## 2018-10-06 DIAGNOSIS — M810 Age-related osteoporosis without current pathological fracture: Secondary | ICD-10-CM | POA: Diagnosis not present

## 2018-10-06 DIAGNOSIS — F0390 Unspecified dementia without behavioral disturbance: Secondary | ICD-10-CM | POA: Diagnosis not present

## 2018-10-06 DIAGNOSIS — Z7982 Long term (current) use of aspirin: Secondary | ICD-10-CM | POA: Diagnosis not present

## 2018-10-06 DIAGNOSIS — Z9181 History of falling: Secondary | ICD-10-CM | POA: Diagnosis not present

## 2018-10-10 DIAGNOSIS — F0390 Unspecified dementia without behavioral disturbance: Secondary | ICD-10-CM | POA: Diagnosis not present

## 2018-10-10 DIAGNOSIS — Z9181 History of falling: Secondary | ICD-10-CM | POA: Diagnosis not present

## 2018-10-10 DIAGNOSIS — M810 Age-related osteoporosis without current pathological fracture: Secondary | ICD-10-CM | POA: Diagnosis not present

## 2018-10-10 DIAGNOSIS — Z7982 Long term (current) use of aspirin: Secondary | ICD-10-CM | POA: Diagnosis not present

## 2018-10-10 DIAGNOSIS — M1990 Unspecified osteoarthritis, unspecified site: Secondary | ICD-10-CM | POA: Diagnosis not present

## 2018-10-11 DIAGNOSIS — F0390 Unspecified dementia without behavioral disturbance: Secondary | ICD-10-CM | POA: Diagnosis not present

## 2018-10-11 DIAGNOSIS — E119 Type 2 diabetes mellitus without complications: Secondary | ICD-10-CM | POA: Diagnosis not present

## 2018-10-12 DIAGNOSIS — F064 Anxiety disorder due to known physiological condition: Secondary | ICD-10-CM | POA: Diagnosis not present

## 2018-10-12 DIAGNOSIS — M1049 Other secondary gout, multiple sites: Secondary | ICD-10-CM | POA: Diagnosis not present

## 2018-10-12 DIAGNOSIS — F331 Major depressive disorder, recurrent, moderate: Secondary | ICD-10-CM | POA: Diagnosis not present

## 2018-10-12 DIAGNOSIS — M1388 Other specified arthritis, other site: Secondary | ICD-10-CM | POA: Diagnosis not present

## 2018-10-12 DIAGNOSIS — E119 Type 2 diabetes mellitus without complications: Secondary | ICD-10-CM | POA: Diagnosis not present

## 2018-10-12 DIAGNOSIS — F0280 Dementia in other diseases classified elsewhere without behavioral disturbance: Secondary | ICD-10-CM | POA: Diagnosis not present

## 2018-10-12 DIAGNOSIS — N183 Chronic kidney disease, stage 3 (moderate): Secondary | ICD-10-CM | POA: Diagnosis not present

## 2018-10-12 DIAGNOSIS — E038 Other specified hypothyroidism: Secondary | ICD-10-CM | POA: Diagnosis not present

## 2018-10-12 DIAGNOSIS — M810 Age-related osteoporosis without current pathological fracture: Secondary | ICD-10-CM | POA: Diagnosis not present

## 2018-10-12 DIAGNOSIS — G301 Alzheimer's disease with late onset: Secondary | ICD-10-CM | POA: Diagnosis not present

## 2018-10-12 DIAGNOSIS — I10 Essential (primary) hypertension: Secondary | ICD-10-CM | POA: Diagnosis not present

## 2018-10-12 DIAGNOSIS — E7849 Other hyperlipidemia: Secondary | ICD-10-CM | POA: Diagnosis not present

## 2018-10-12 DIAGNOSIS — F0281 Dementia in other diseases classified elsewhere with behavioral disturbance: Secondary | ICD-10-CM | POA: Diagnosis not present

## 2018-10-19 DIAGNOSIS — F0390 Unspecified dementia without behavioral disturbance: Secondary | ICD-10-CM | POA: Diagnosis not present

## 2018-10-19 DIAGNOSIS — M1990 Unspecified osteoarthritis, unspecified site: Secondary | ICD-10-CM | POA: Diagnosis not present

## 2018-10-19 DIAGNOSIS — Z7982 Long term (current) use of aspirin: Secondary | ICD-10-CM | POA: Diagnosis not present

## 2018-10-19 DIAGNOSIS — M810 Age-related osteoporosis without current pathological fracture: Secondary | ICD-10-CM | POA: Diagnosis not present

## 2018-10-19 DIAGNOSIS — Z9181 History of falling: Secondary | ICD-10-CM | POA: Diagnosis not present

## 2018-10-24 DIAGNOSIS — Z9181 History of falling: Secondary | ICD-10-CM | POA: Diagnosis not present

## 2018-10-24 DIAGNOSIS — Z7982 Long term (current) use of aspirin: Secondary | ICD-10-CM | POA: Diagnosis not present

## 2018-10-24 DIAGNOSIS — M1990 Unspecified osteoarthritis, unspecified site: Secondary | ICD-10-CM | POA: Diagnosis not present

## 2018-10-24 DIAGNOSIS — F0390 Unspecified dementia without behavioral disturbance: Secondary | ICD-10-CM | POA: Diagnosis not present

## 2018-10-24 DIAGNOSIS — M810 Age-related osteoporosis without current pathological fracture: Secondary | ICD-10-CM | POA: Diagnosis not present

## 2018-10-31 DIAGNOSIS — Z7982 Long term (current) use of aspirin: Secondary | ICD-10-CM | POA: Diagnosis not present

## 2018-10-31 DIAGNOSIS — M810 Age-related osteoporosis without current pathological fracture: Secondary | ICD-10-CM | POA: Diagnosis not present

## 2018-10-31 DIAGNOSIS — M1990 Unspecified osteoarthritis, unspecified site: Secondary | ICD-10-CM | POA: Diagnosis not present

## 2018-10-31 DIAGNOSIS — F0390 Unspecified dementia without behavioral disturbance: Secondary | ICD-10-CM | POA: Diagnosis not present

## 2018-10-31 DIAGNOSIS — Z9181 History of falling: Secondary | ICD-10-CM | POA: Diagnosis not present

## 2018-11-09 DIAGNOSIS — F331 Major depressive disorder, recurrent, moderate: Secondary | ICD-10-CM | POA: Diagnosis not present

## 2018-11-09 DIAGNOSIS — F0281 Dementia in other diseases classified elsewhere with behavioral disturbance: Secondary | ICD-10-CM | POA: Diagnosis not present

## 2018-11-09 DIAGNOSIS — G301 Alzheimer's disease with late onset: Secondary | ICD-10-CM | POA: Diagnosis not present

## 2018-11-09 DIAGNOSIS — F064 Anxiety disorder due to known physiological condition: Secondary | ICD-10-CM | POA: Diagnosis not present

## 2018-11-11 DIAGNOSIS — M1388 Other specified arthritis, other site: Secondary | ICD-10-CM | POA: Diagnosis not present

## 2018-11-11 DIAGNOSIS — N183 Chronic kidney disease, stage 3 unspecified: Secondary | ICD-10-CM | POA: Diagnosis not present

## 2018-11-11 DIAGNOSIS — E7849 Other hyperlipidemia: Secondary | ICD-10-CM | POA: Diagnosis not present

## 2018-11-11 DIAGNOSIS — I10 Essential (primary) hypertension: Secondary | ICD-10-CM | POA: Diagnosis not present

## 2018-11-11 DIAGNOSIS — E119 Type 2 diabetes mellitus without complications: Secondary | ICD-10-CM | POA: Diagnosis not present

## 2018-11-11 DIAGNOSIS — E038 Other specified hypothyroidism: Secondary | ICD-10-CM | POA: Diagnosis not present

## 2018-11-11 DIAGNOSIS — F0280 Dementia in other diseases classified elsewhere without behavioral disturbance: Secondary | ICD-10-CM | POA: Diagnosis not present

## 2018-11-11 DIAGNOSIS — M810 Age-related osteoporosis without current pathological fracture: Secondary | ICD-10-CM | POA: Diagnosis not present

## 2018-11-11 DIAGNOSIS — F331 Major depressive disorder, recurrent, moderate: Secondary | ICD-10-CM | POA: Diagnosis not present

## 2018-11-11 DIAGNOSIS — M1049 Other secondary gout, multiple sites: Secondary | ICD-10-CM | POA: Diagnosis not present

## 2018-11-15 DIAGNOSIS — M79675 Pain in left toe(s): Secondary | ICD-10-CM | POA: Diagnosis not present

## 2018-11-15 DIAGNOSIS — B351 Tinea unguium: Secondary | ICD-10-CM | POA: Diagnosis not present

## 2018-11-15 DIAGNOSIS — M79674 Pain in right toe(s): Secondary | ICD-10-CM | POA: Diagnosis not present

## 2018-11-30 DIAGNOSIS — M810 Age-related osteoporosis without current pathological fracture: Secondary | ICD-10-CM | POA: Diagnosis not present

## 2018-11-30 DIAGNOSIS — M1388 Other specified arthritis, other site: Secondary | ICD-10-CM | POA: Diagnosis not present

## 2018-11-30 DIAGNOSIS — W1789XA Other fall from one level to another, initial encounter: Secondary | ICD-10-CM | POA: Diagnosis not present

## 2018-11-30 DIAGNOSIS — F0280 Dementia in other diseases classified elsewhere without behavioral disturbance: Secondary | ICD-10-CM | POA: Diagnosis not present

## 2018-12-01 DIAGNOSIS — E569 Vitamin deficiency, unspecified: Secondary | ICD-10-CM | POA: Diagnosis not present

## 2018-12-01 DIAGNOSIS — M1990 Unspecified osteoarthritis, unspecified site: Secondary | ICD-10-CM | POA: Diagnosis not present

## 2018-12-01 DIAGNOSIS — M109 Gout, unspecified: Secondary | ICD-10-CM | POA: Diagnosis not present

## 2018-12-01 DIAGNOSIS — E785 Hyperlipidemia, unspecified: Secondary | ICD-10-CM | POA: Diagnosis not present

## 2018-12-01 DIAGNOSIS — F0280 Dementia in other diseases classified elsewhere without behavioral disturbance: Secondary | ICD-10-CM | POA: Diagnosis not present

## 2018-12-01 DIAGNOSIS — J309 Allergic rhinitis, unspecified: Secondary | ICD-10-CM | POA: Diagnosis not present

## 2018-12-01 DIAGNOSIS — Z9181 History of falling: Secondary | ICD-10-CM | POA: Diagnosis not present

## 2018-12-01 DIAGNOSIS — M810 Age-related osteoporosis without current pathological fracture: Secondary | ICD-10-CM | POA: Diagnosis not present

## 2018-12-01 DIAGNOSIS — E039 Hypothyroidism, unspecified: Secondary | ICD-10-CM | POA: Diagnosis not present

## 2018-12-01 DIAGNOSIS — R911 Solitary pulmonary nodule: Secondary | ICD-10-CM | POA: Diagnosis not present

## 2018-12-01 DIAGNOSIS — F329 Major depressive disorder, single episode, unspecified: Secondary | ICD-10-CM | POA: Diagnosis not present

## 2018-12-01 DIAGNOSIS — N189 Chronic kidney disease, unspecified: Secondary | ICD-10-CM | POA: Diagnosis not present

## 2018-12-01 DIAGNOSIS — I129 Hypertensive chronic kidney disease with stage 1 through stage 4 chronic kidney disease, or unspecified chronic kidney disease: Secondary | ICD-10-CM | POA: Diagnosis not present

## 2018-12-01 DIAGNOSIS — E1122 Type 2 diabetes mellitus with diabetic chronic kidney disease: Secondary | ICD-10-CM | POA: Diagnosis not present

## 2018-12-01 DIAGNOSIS — Z7982 Long term (current) use of aspirin: Secondary | ICD-10-CM | POA: Diagnosis not present

## 2018-12-06 DIAGNOSIS — M1990 Unspecified osteoarthritis, unspecified site: Secondary | ICD-10-CM | POA: Diagnosis not present

## 2018-12-06 DIAGNOSIS — E1122 Type 2 diabetes mellitus with diabetic chronic kidney disease: Secondary | ICD-10-CM | POA: Diagnosis not present

## 2018-12-06 DIAGNOSIS — F329 Major depressive disorder, single episode, unspecified: Secondary | ICD-10-CM | POA: Diagnosis not present

## 2018-12-06 DIAGNOSIS — G301 Alzheimer's disease with late onset: Secondary | ICD-10-CM | POA: Diagnosis not present

## 2018-12-06 DIAGNOSIS — F0280 Dementia in other diseases classified elsewhere without behavioral disturbance: Secondary | ICD-10-CM | POA: Diagnosis not present

## 2018-12-06 DIAGNOSIS — F331 Major depressive disorder, recurrent, moderate: Secondary | ICD-10-CM | POA: Diagnosis not present

## 2018-12-06 DIAGNOSIS — F064 Anxiety disorder due to known physiological condition: Secondary | ICD-10-CM | POA: Diagnosis not present

## 2018-12-06 DIAGNOSIS — M810 Age-related osteoporosis without current pathological fracture: Secondary | ICD-10-CM | POA: Diagnosis not present

## 2018-12-06 DIAGNOSIS — F0281 Dementia in other diseases classified elsewhere with behavioral disturbance: Secondary | ICD-10-CM | POA: Diagnosis not present

## 2018-12-06 DIAGNOSIS — E039 Hypothyroidism, unspecified: Secondary | ICD-10-CM | POA: Diagnosis not present

## 2018-12-07 DIAGNOSIS — M5489 Other dorsalgia: Secondary | ICD-10-CM | POA: Diagnosis not present

## 2018-12-07 DIAGNOSIS — R42 Dizziness and giddiness: Secondary | ICD-10-CM | POA: Diagnosis not present

## 2018-12-07 DIAGNOSIS — F0280 Dementia in other diseases classified elsewhere without behavioral disturbance: Secondary | ICD-10-CM | POA: Diagnosis not present

## 2018-12-07 DIAGNOSIS — R198 Other specified symptoms and signs involving the digestive system and abdomen: Secondary | ICD-10-CM | POA: Diagnosis not present

## 2018-12-08 DIAGNOSIS — E039 Hypothyroidism, unspecified: Secondary | ICD-10-CM | POA: Diagnosis not present

## 2018-12-08 DIAGNOSIS — M810 Age-related osteoporosis without current pathological fracture: Secondary | ICD-10-CM | POA: Diagnosis not present

## 2018-12-08 DIAGNOSIS — M1990 Unspecified osteoarthritis, unspecified site: Secondary | ICD-10-CM | POA: Diagnosis not present

## 2018-12-08 DIAGNOSIS — F0280 Dementia in other diseases classified elsewhere without behavioral disturbance: Secondary | ICD-10-CM | POA: Diagnosis not present

## 2018-12-08 DIAGNOSIS — E1122 Type 2 diabetes mellitus with diabetic chronic kidney disease: Secondary | ICD-10-CM | POA: Diagnosis not present

## 2018-12-08 DIAGNOSIS — F329 Major depressive disorder, single episode, unspecified: Secondary | ICD-10-CM | POA: Diagnosis not present

## 2018-12-12 DIAGNOSIS — M810 Age-related osteoporosis without current pathological fracture: Secondary | ICD-10-CM | POA: Diagnosis not present

## 2018-12-12 DIAGNOSIS — M1990 Unspecified osteoarthritis, unspecified site: Secondary | ICD-10-CM | POA: Diagnosis not present

## 2018-12-12 DIAGNOSIS — E1122 Type 2 diabetes mellitus with diabetic chronic kidney disease: Secondary | ICD-10-CM | POA: Diagnosis not present

## 2018-12-12 DIAGNOSIS — F329 Major depressive disorder, single episode, unspecified: Secondary | ICD-10-CM | POA: Diagnosis not present

## 2018-12-12 DIAGNOSIS — E039 Hypothyroidism, unspecified: Secondary | ICD-10-CM | POA: Diagnosis not present

## 2018-12-12 DIAGNOSIS — F0280 Dementia in other diseases classified elsewhere without behavioral disturbance: Secondary | ICD-10-CM | POA: Diagnosis not present

## 2018-12-13 DIAGNOSIS — M810 Age-related osteoporosis without current pathological fracture: Secondary | ICD-10-CM | POA: Diagnosis not present

## 2018-12-13 DIAGNOSIS — E038 Other specified hypothyroidism: Secondary | ICD-10-CM | POA: Diagnosis not present

## 2018-12-13 DIAGNOSIS — N183 Chronic kidney disease, stage 3 unspecified: Secondary | ICD-10-CM | POA: Diagnosis not present

## 2018-12-13 DIAGNOSIS — F0280 Dementia in other diseases classified elsewhere without behavioral disturbance: Secondary | ICD-10-CM | POA: Diagnosis not present

## 2018-12-13 DIAGNOSIS — M1049 Other secondary gout, multiple sites: Secondary | ICD-10-CM | POA: Diagnosis not present

## 2018-12-13 DIAGNOSIS — F331 Major depressive disorder, recurrent, moderate: Secondary | ICD-10-CM | POA: Diagnosis not present

## 2018-12-13 DIAGNOSIS — M1388 Other specified arthritis, other site: Secondary | ICD-10-CM | POA: Diagnosis not present

## 2018-12-13 DIAGNOSIS — E119 Type 2 diabetes mellitus without complications: Secondary | ICD-10-CM | POA: Diagnosis not present

## 2018-12-13 DIAGNOSIS — I10 Essential (primary) hypertension: Secondary | ICD-10-CM | POA: Diagnosis not present

## 2018-12-13 DIAGNOSIS — E7849 Other hyperlipidemia: Secondary | ICD-10-CM | POA: Diagnosis not present

## 2018-12-14 DIAGNOSIS — E039 Hypothyroidism, unspecified: Secondary | ICD-10-CM | POA: Diagnosis not present

## 2018-12-14 DIAGNOSIS — F329 Major depressive disorder, single episode, unspecified: Secondary | ICD-10-CM | POA: Diagnosis not present

## 2018-12-14 DIAGNOSIS — M1990 Unspecified osteoarthritis, unspecified site: Secondary | ICD-10-CM | POA: Diagnosis not present

## 2018-12-14 DIAGNOSIS — E1122 Type 2 diabetes mellitus with diabetic chronic kidney disease: Secondary | ICD-10-CM | POA: Diagnosis not present

## 2018-12-14 DIAGNOSIS — F0280 Dementia in other diseases classified elsewhere without behavioral disturbance: Secondary | ICD-10-CM | POA: Diagnosis not present

## 2018-12-14 DIAGNOSIS — M810 Age-related osteoporosis without current pathological fracture: Secondary | ICD-10-CM | POA: Diagnosis not present

## 2018-12-19 DIAGNOSIS — M1990 Unspecified osteoarthritis, unspecified site: Secondary | ICD-10-CM | POA: Diagnosis not present

## 2018-12-19 DIAGNOSIS — F0280 Dementia in other diseases classified elsewhere without behavioral disturbance: Secondary | ICD-10-CM | POA: Diagnosis not present

## 2018-12-19 DIAGNOSIS — F329 Major depressive disorder, single episode, unspecified: Secondary | ICD-10-CM | POA: Diagnosis not present

## 2018-12-19 DIAGNOSIS — M810 Age-related osteoporosis without current pathological fracture: Secondary | ICD-10-CM | POA: Diagnosis not present

## 2018-12-19 DIAGNOSIS — E1122 Type 2 diabetes mellitus with diabetic chronic kidney disease: Secondary | ICD-10-CM | POA: Diagnosis not present

## 2018-12-19 DIAGNOSIS — E039 Hypothyroidism, unspecified: Secondary | ICD-10-CM | POA: Diagnosis not present

## 2018-12-21 DIAGNOSIS — I10 Essential (primary) hypertension: Secondary | ICD-10-CM | POA: Diagnosis not present

## 2018-12-21 DIAGNOSIS — E038 Other specified hypothyroidism: Secondary | ICD-10-CM | POA: Diagnosis not present

## 2018-12-21 DIAGNOSIS — S61213A Laceration without foreign body of left middle finger without damage to nail, initial encounter: Secondary | ICD-10-CM | POA: Diagnosis not present

## 2018-12-21 DIAGNOSIS — E7849 Other hyperlipidemia: Secondary | ICD-10-CM | POA: Diagnosis not present

## 2018-12-25 DIAGNOSIS — Y998 Other external cause status: Secondary | ICD-10-CM | POA: Diagnosis not present

## 2018-12-25 DIAGNOSIS — W19XXXA Unspecified fall, initial encounter: Secondary | ICD-10-CM | POA: Diagnosis not present

## 2018-12-25 DIAGNOSIS — I10 Essential (primary) hypertension: Secondary | ICD-10-CM | POA: Diagnosis not present

## 2018-12-25 DIAGNOSIS — M549 Dorsalgia, unspecified: Secondary | ICD-10-CM | POA: Diagnosis not present

## 2018-12-25 DIAGNOSIS — R404 Transient alteration of awareness: Secondary | ICD-10-CM | POA: Diagnosis not present

## 2018-12-25 DIAGNOSIS — S0990XA Unspecified injury of head, initial encounter: Secondary | ICD-10-CM | POA: Diagnosis not present

## 2018-12-25 DIAGNOSIS — W01198A Fall on same level from slipping, tripping and stumbling with subsequent striking against other object, initial encounter: Secondary | ICD-10-CM | POA: Diagnosis not present

## 2018-12-25 DIAGNOSIS — S199XXA Unspecified injury of neck, initial encounter: Secondary | ICD-10-CM | POA: Diagnosis not present

## 2018-12-26 DIAGNOSIS — E1122 Type 2 diabetes mellitus with diabetic chronic kidney disease: Secondary | ICD-10-CM | POA: Diagnosis not present

## 2018-12-26 DIAGNOSIS — F0280 Dementia in other diseases classified elsewhere without behavioral disturbance: Secondary | ICD-10-CM | POA: Diagnosis not present

## 2018-12-26 DIAGNOSIS — M1990 Unspecified osteoarthritis, unspecified site: Secondary | ICD-10-CM | POA: Diagnosis not present

## 2018-12-26 DIAGNOSIS — E039 Hypothyroidism, unspecified: Secondary | ICD-10-CM | POA: Diagnosis not present

## 2018-12-26 DIAGNOSIS — F329 Major depressive disorder, single episode, unspecified: Secondary | ICD-10-CM | POA: Diagnosis not present

## 2018-12-26 DIAGNOSIS — M810 Age-related osteoporosis without current pathological fracture: Secondary | ICD-10-CM | POA: Diagnosis not present

## 2018-12-28 DIAGNOSIS — R296 Repeated falls: Secondary | ICD-10-CM | POA: Diagnosis not present

## 2018-12-28 DIAGNOSIS — Z20828 Contact with and (suspected) exposure to other viral communicable diseases: Secondary | ICD-10-CM | POA: Diagnosis not present

## 2018-12-28 DIAGNOSIS — F0390 Unspecified dementia without behavioral disturbance: Secondary | ICD-10-CM | POA: Diagnosis not present

## 2018-12-30 DIAGNOSIS — E1122 Type 2 diabetes mellitus with diabetic chronic kidney disease: Secondary | ICD-10-CM | POA: Diagnosis not present

## 2018-12-30 DIAGNOSIS — E039 Hypothyroidism, unspecified: Secondary | ICD-10-CM | POA: Diagnosis not present

## 2018-12-30 DIAGNOSIS — M1990 Unspecified osteoarthritis, unspecified site: Secondary | ICD-10-CM | POA: Diagnosis not present

## 2018-12-30 DIAGNOSIS — F329 Major depressive disorder, single episode, unspecified: Secondary | ICD-10-CM | POA: Diagnosis not present

## 2018-12-30 DIAGNOSIS — M810 Age-related osteoporosis without current pathological fracture: Secondary | ICD-10-CM | POA: Diagnosis not present

## 2018-12-30 DIAGNOSIS — F0280 Dementia in other diseases classified elsewhere without behavioral disturbance: Secondary | ICD-10-CM | POA: Diagnosis not present

## 2018-12-31 DIAGNOSIS — F329 Major depressive disorder, single episode, unspecified: Secondary | ICD-10-CM | POA: Diagnosis not present

## 2018-12-31 DIAGNOSIS — J309 Allergic rhinitis, unspecified: Secondary | ICD-10-CM | POA: Diagnosis not present

## 2018-12-31 DIAGNOSIS — E1122 Type 2 diabetes mellitus with diabetic chronic kidney disease: Secondary | ICD-10-CM | POA: Diagnosis not present

## 2018-12-31 DIAGNOSIS — M109 Gout, unspecified: Secondary | ICD-10-CM | POA: Diagnosis not present

## 2018-12-31 DIAGNOSIS — E039 Hypothyroidism, unspecified: Secondary | ICD-10-CM | POA: Diagnosis not present

## 2018-12-31 DIAGNOSIS — Z9181 History of falling: Secondary | ICD-10-CM | POA: Diagnosis not present

## 2018-12-31 DIAGNOSIS — E569 Vitamin deficiency, unspecified: Secondary | ICD-10-CM | POA: Diagnosis not present

## 2018-12-31 DIAGNOSIS — F0280 Dementia in other diseases classified elsewhere without behavioral disturbance: Secondary | ICD-10-CM | POA: Diagnosis not present

## 2018-12-31 DIAGNOSIS — Z7982 Long term (current) use of aspirin: Secondary | ICD-10-CM | POA: Diagnosis not present

## 2018-12-31 DIAGNOSIS — M1990 Unspecified osteoarthritis, unspecified site: Secondary | ICD-10-CM | POA: Diagnosis not present

## 2018-12-31 DIAGNOSIS — E785 Hyperlipidemia, unspecified: Secondary | ICD-10-CM | POA: Diagnosis not present

## 2018-12-31 DIAGNOSIS — N189 Chronic kidney disease, unspecified: Secondary | ICD-10-CM | POA: Diagnosis not present

## 2018-12-31 DIAGNOSIS — I129 Hypertensive chronic kidney disease with stage 1 through stage 4 chronic kidney disease, or unspecified chronic kidney disease: Secondary | ICD-10-CM | POA: Diagnosis not present

## 2018-12-31 DIAGNOSIS — R911 Solitary pulmonary nodule: Secondary | ICD-10-CM | POA: Diagnosis not present

## 2018-12-31 DIAGNOSIS — M810 Age-related osteoporosis without current pathological fracture: Secondary | ICD-10-CM | POA: Diagnosis not present

## 2019-01-02 DIAGNOSIS — E1122 Type 2 diabetes mellitus with diabetic chronic kidney disease: Secondary | ICD-10-CM | POA: Diagnosis not present

## 2019-01-02 DIAGNOSIS — M1990 Unspecified osteoarthritis, unspecified site: Secondary | ICD-10-CM | POA: Diagnosis not present

## 2019-01-02 DIAGNOSIS — E039 Hypothyroidism, unspecified: Secondary | ICD-10-CM | POA: Diagnosis not present

## 2019-01-02 DIAGNOSIS — M810 Age-related osteoporosis without current pathological fracture: Secondary | ICD-10-CM | POA: Diagnosis not present

## 2019-01-02 DIAGNOSIS — F329 Major depressive disorder, single episode, unspecified: Secondary | ICD-10-CM | POA: Diagnosis not present

## 2019-01-02 DIAGNOSIS — F0280 Dementia in other diseases classified elsewhere without behavioral disturbance: Secondary | ICD-10-CM | POA: Diagnosis not present

## 2019-01-07 DIAGNOSIS — R58 Hemorrhage, not elsewhere classified: Secondary | ICD-10-CM | POA: Diagnosis not present

## 2019-01-07 DIAGNOSIS — I10 Essential (primary) hypertension: Secondary | ICD-10-CM | POA: Diagnosis not present

## 2019-01-07 DIAGNOSIS — S0003XA Contusion of scalp, initial encounter: Secondary | ICD-10-CM | POA: Diagnosis not present

## 2019-01-07 DIAGNOSIS — W01198A Fall on same level from slipping, tripping and stumbling with subsequent striking against other object, initial encounter: Secondary | ICD-10-CM | POA: Diagnosis not present

## 2019-01-07 DIAGNOSIS — S0001XA Abrasion of scalp, initial encounter: Secondary | ICD-10-CM | POA: Diagnosis not present

## 2019-01-07 DIAGNOSIS — R410 Disorientation, unspecified: Secondary | ICD-10-CM | POA: Diagnosis not present

## 2019-01-07 DIAGNOSIS — M47812 Spondylosis without myelopathy or radiculopathy, cervical region: Secondary | ICD-10-CM | POA: Diagnosis not present

## 2019-01-07 DIAGNOSIS — W19XXXA Unspecified fall, initial encounter: Secondary | ICD-10-CM | POA: Diagnosis not present

## 2019-01-07 DIAGNOSIS — F0390 Unspecified dementia without behavioral disturbance: Secondary | ICD-10-CM | POA: Diagnosis not present

## 2019-01-07 DIAGNOSIS — R220 Localized swelling, mass and lump, head: Secondary | ICD-10-CM | POA: Diagnosis not present

## 2019-01-07 DIAGNOSIS — Z23 Encounter for immunization: Secondary | ICD-10-CM | POA: Diagnosis not present

## 2019-01-07 DIAGNOSIS — S199XXA Unspecified injury of neck, initial encounter: Secondary | ICD-10-CM | POA: Diagnosis not present

## 2019-01-07 DIAGNOSIS — Y998 Other external cause status: Secondary | ICD-10-CM | POA: Diagnosis not present

## 2019-01-08 DIAGNOSIS — S199XXA Unspecified injury of neck, initial encounter: Secondary | ICD-10-CM | POA: Diagnosis not present

## 2019-01-08 DIAGNOSIS — R220 Localized swelling, mass and lump, head: Secondary | ICD-10-CM | POA: Diagnosis not present

## 2019-01-08 DIAGNOSIS — S0003XA Contusion of scalp, initial encounter: Secondary | ICD-10-CM | POA: Diagnosis not present

## 2019-01-10 DIAGNOSIS — R296 Repeated falls: Secondary | ICD-10-CM | POA: Diagnosis not present

## 2019-01-10 DIAGNOSIS — B3789 Other sites of candidiasis: Secondary | ICD-10-CM | POA: Diagnosis not present

## 2019-01-10 DIAGNOSIS — S0990XA Unspecified injury of head, initial encounter: Secondary | ICD-10-CM | POA: Diagnosis not present

## 2019-01-17 DIAGNOSIS — F331 Major depressive disorder, recurrent, moderate: Secondary | ICD-10-CM | POA: Diagnosis not present

## 2019-01-17 DIAGNOSIS — F0281 Dementia in other diseases classified elsewhere with behavioral disturbance: Secondary | ICD-10-CM | POA: Diagnosis not present

## 2019-01-17 DIAGNOSIS — G301 Alzheimer's disease with late onset: Secondary | ICD-10-CM | POA: Diagnosis not present

## 2019-01-17 DIAGNOSIS — F064 Anxiety disorder due to known physiological condition: Secondary | ICD-10-CM | POA: Diagnosis not present

## 2019-01-19 DIAGNOSIS — I10 Essential (primary) hypertension: Secondary | ICD-10-CM | POA: Diagnosis not present

## 2019-01-19 DIAGNOSIS — R110 Nausea: Secondary | ICD-10-CM | POA: Diagnosis not present

## 2019-01-19 DIAGNOSIS — R42 Dizziness and giddiness: Secondary | ICD-10-CM | POA: Diagnosis not present

## 2019-01-19 DIAGNOSIS — R296 Repeated falls: Secondary | ICD-10-CM | POA: Diagnosis not present

## 2019-01-23 DIAGNOSIS — M79674 Pain in right toe(s): Secondary | ICD-10-CM | POA: Diagnosis not present

## 2019-01-23 DIAGNOSIS — B351 Tinea unguium: Secondary | ICD-10-CM | POA: Diagnosis not present

## 2019-01-23 DIAGNOSIS — M79675 Pain in left toe(s): Secondary | ICD-10-CM | POA: Diagnosis not present

## 2020-02-10 IMAGING — MR MR HEAD W/O CM
10 series · 48 of 48 positions shown · non-contrast
Comparison: CT 07/28/2015.  MRI 06/22/2011.

CLINICAL DATA: Dementia

EXAM:
MRI HEAD WITHOUT CONTRAST
TECHNIQUE: Multiplanar, multiecho pulse sequences of the brain and surrounding
structures were obtained without intravenous contrast.

[Series 2: T1 · sagittal · 5.0mm · 0.45mm/px · 2 of 21 slices shown]
[im 1/21]
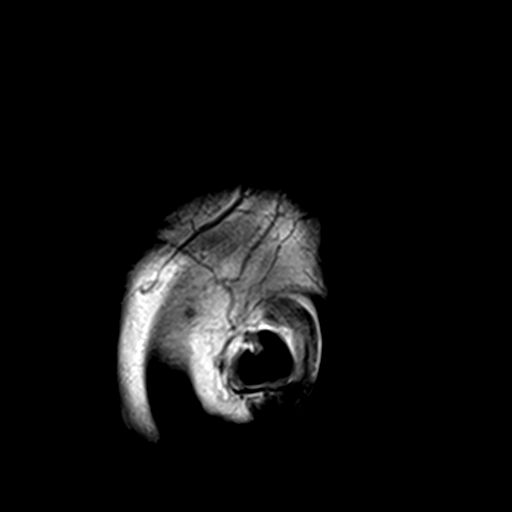
[im 21/21]
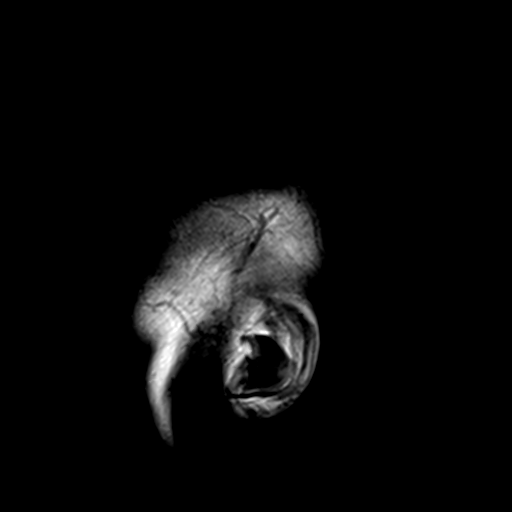

[Series 3: DWI · axial · 3.0mm · 1.80mm/px · z∈[-60,+85]mm · 9 of 100 slices shown (1 of 4)]
[im 1/100]
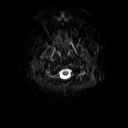
[im 13/100]
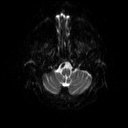
[im 25/100]
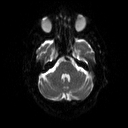
[im 38/100]
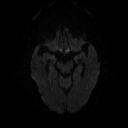
[im 50/100]
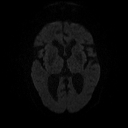
[im 62/100]
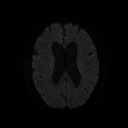
[im 75/100]
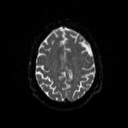
[im 87/100]
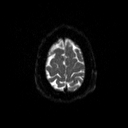
[im 100/100]
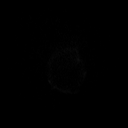

[Series 4: DWI · axial · 3.0mm · 1.80mm/px · z∈[-60,+85]mm · 5 of 50 slices shown (2 of 4)]
[im 1/50]
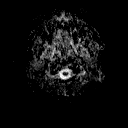
[im 13/50]
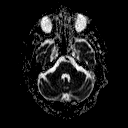
[im 25/50]
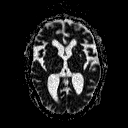
[im 37/50]
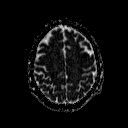
[im 50/50]
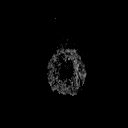

[Series 5: T2 · axial · 5.0mm · 0.51mm/px · z∈[-59,+86]mm · 2 of 22 slices shown (1 of 2)]
[im 1/22]
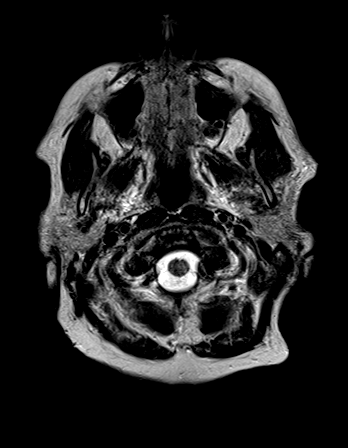
[im 22/22]
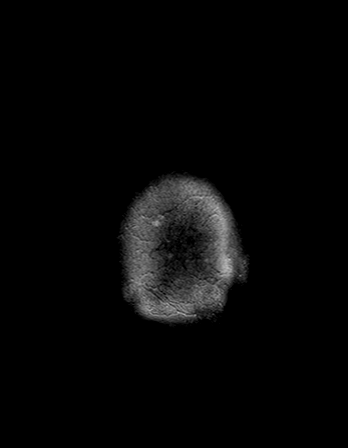

[Series 8: swi_images · axial · 5.0mm · 0.90mm/px · z∈[-58,+84]mm · 3 of 30 slices shown]
[im 1/30]
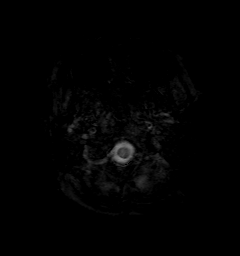
[im 15/30]
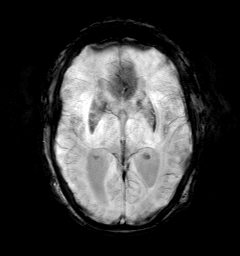
[im 30/30]
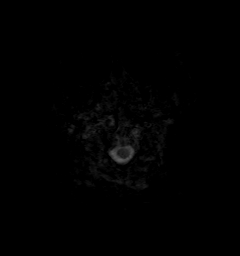

[Series 9: t1_mpr_tra · axial · 1.0mm · 0.71mm/px · z∈[-57,+84]mm · 13 of 144 slices shown]
[im 1/144]
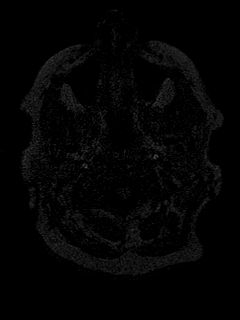
[im 12/144]
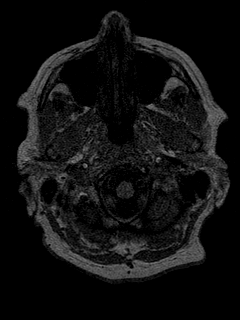
[im 24/144]
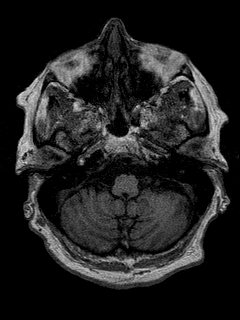
[im 36/144]
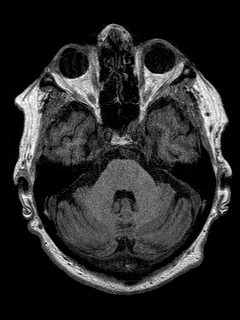
[im 48/144]
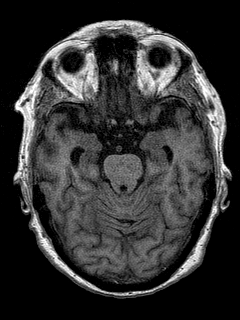
[im 60/144]
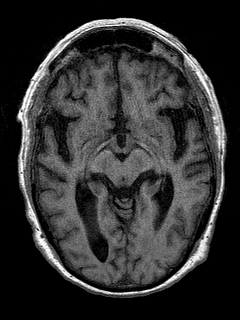
[im 72/144]
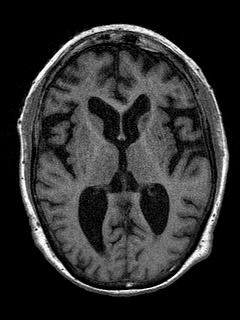
[im 84/144]
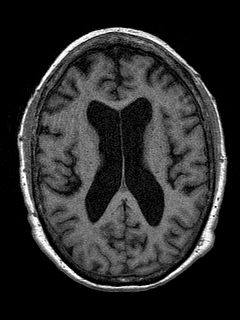
[im 96/144]
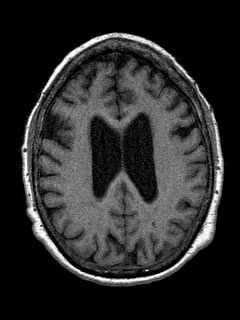
[im 108/144]
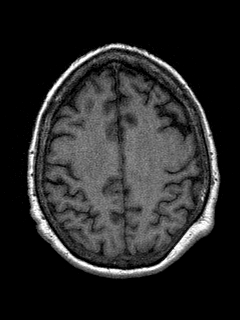
[im 120/144]
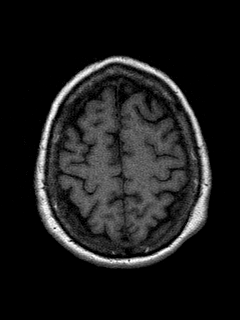
[im 132/144]
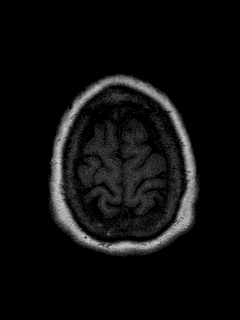
[im 144/144]
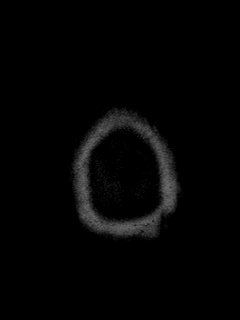

[Series 10: T2 · coronal · 5.0mm · 0.45mm/px · 2 of 27 slices shown (2 of 2)]
[im 1/27]
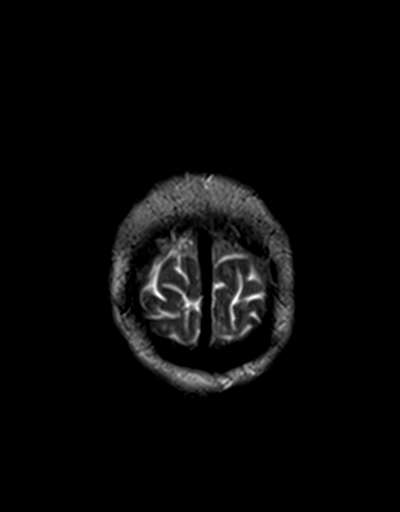
[im 27/27]
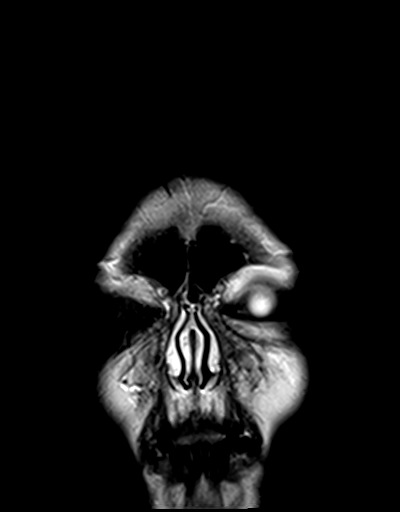

[Series 11: FLAIR · axial · 3.0mm · 0.45mm/px · z∈[-54,+79]mm · 3 of 30 slices shown]
[im 1/30]
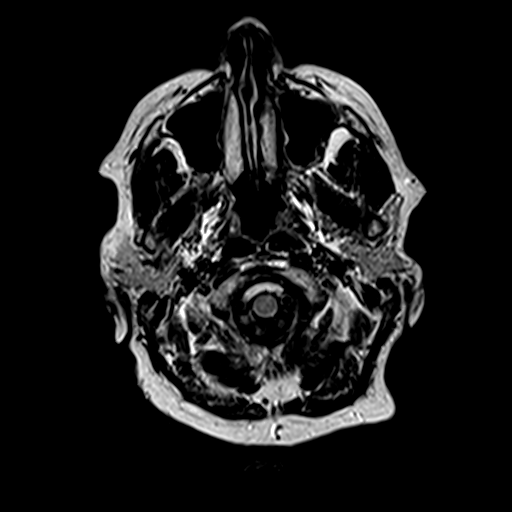
[im 15/30]
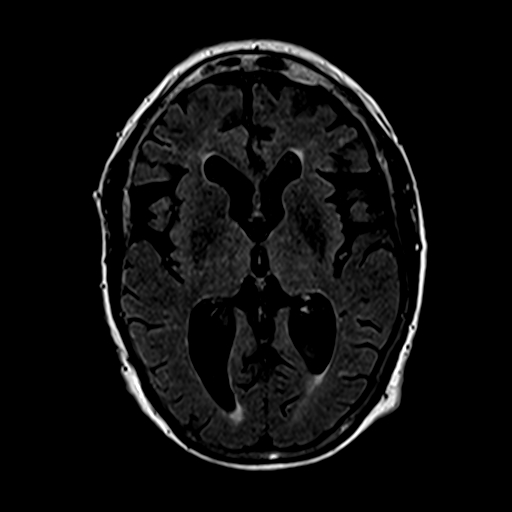
[im 30/30]
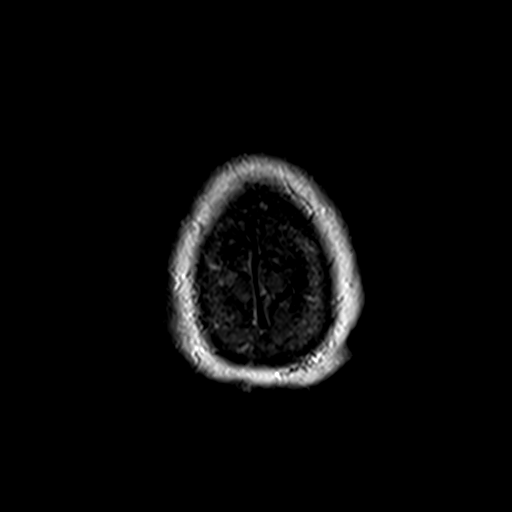

[Series 12: DWI · coronal · 5.0mm · 1.80mm/px · 6 of 68 slices shown (3 of 4)]
[im 1/68]
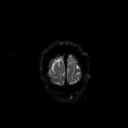
[im 14/68]
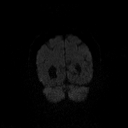
[im 27/68]
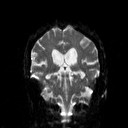
[im 41/68]
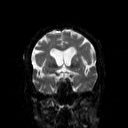
[im 54/68]
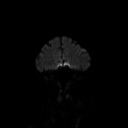
[im 68/68]
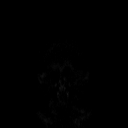

[Series 13: DWI · coronal · 5.0mm · 1.80mm/px · 3 of 34 slices shown (4 of 4)]
[im 1/34]
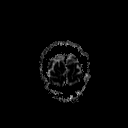
[im 17/34]
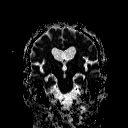
[im 34/34]
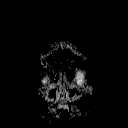

[48 of 48 positions shown; findings below may reference images not displayed]

FINDINGS: Brain: Diffusion imaging does not show any acute or subacute
infarction. The brainstem and cerebellum are normal. Cerebral
hemispheres show mild age related atrophy with mild chronic
appearing small vessel change of the deep white matter. No cortical
or large vessel territory infarction. No mass lesion, hemorrhage,
hydrocephalus or extra-axial collection. Compared to the study of
1426, the white matter changes are slightly progressive since that
time.

Vascular: Major vessels at the base of the brain show flow.

Skull and upper cervical spine: Negative

Sinuses/Orbits: Clear/normal

Other: None
IMPRESSION: No acute or reversible finding. Mild generalized brain atrophy. Mild
chronic small-vessel change of the hemispheric white matter,
slightly progressive since the study of 1426.
